# Patient Record
Sex: Female | Born: 1945
Health system: Southern US, Community
[De-identification: ages and names within clinical notes are randomized; demographics above are authoritative.]

## PROBLEM LIST (undated history)

## (undated) DIAGNOSIS — Z9289 Personal history of other medical treatment: Secondary | ICD-10-CM

## (undated) DIAGNOSIS — K219 Gastro-esophageal reflux disease without esophagitis: Secondary | ICD-10-CM

## (undated) DIAGNOSIS — R42 Dizziness and giddiness: Secondary | ICD-10-CM

## (undated) DIAGNOSIS — I471 Supraventricular tachycardia, unspecified: Secondary | ICD-10-CM

## (undated) DIAGNOSIS — N390 Urinary tract infection, site not specified: Secondary | ICD-10-CM

## (undated) DIAGNOSIS — E7800 Pure hypercholesterolemia, unspecified: Secondary | ICD-10-CM

## (undated) DIAGNOSIS — J189 Pneumonia, unspecified organism: Secondary | ICD-10-CM

## (undated) DIAGNOSIS — K5790 Diverticulosis of intestine, part unspecified, without perforation or abscess without bleeding: Secondary | ICD-10-CM

## (undated) DIAGNOSIS — G459 Transient cerebral ischemic attack, unspecified: Secondary | ICD-10-CM

## (undated) DIAGNOSIS — I10 Essential (primary) hypertension: Secondary | ICD-10-CM

## (undated) HISTORY — DX: Pneumonia, unspecified organism: J18.9

## (undated) HISTORY — PX: CARDIAC CATHETERIZATION: SHX172

## (undated) HISTORY — DX: Personal history of other medical treatment: Z92.89

## (undated) HISTORY — DX: Gastro-esophageal reflux disease without esophagitis: K21.9

## (undated) HISTORY — DX: Diverticulosis of intestine, part unspecified, without perforation or abscess without bleeding: K57.90

## (undated) HISTORY — DX: Supraventricular tachycardia: I47.1

## (undated) HISTORY — DX: Essential (primary) hypertension: I10

## (undated) HISTORY — DX: Supraventricular tachycardia, unspecified: I47.10

## (undated) HISTORY — DX: Transient cerebral ischemic attack, unspecified: G45.9

## (undated) HISTORY — DX: Hypercalcemia: E83.52

## (undated) HISTORY — PX: BREAST CYST ASPIRATION: SHX578

## (undated) HISTORY — DX: Urinary tract infection, site not specified: N39.0

---
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
## 1979-03-03 HISTORY — PX: CHOLECYSTECTOMY: SHX55

## 2008-03-02 DIAGNOSIS — G459 Transient cerebral ischemic attack, unspecified: Secondary | ICD-10-CM

## 2008-03-02 HISTORY — DX: Transient cerebral ischemic attack, unspecified: G45.9

## 2008-03-15 ENCOUNTER — Ambulatory Visit: Payer: Self-pay | Admitting: Family Medicine

## 2008-04-03 ENCOUNTER — Ambulatory Visit: Payer: Self-pay | Admitting: Family Medicine

## 2008-08-10 DIAGNOSIS — K219 Gastro-esophageal reflux disease without esophagitis: Secondary | ICD-10-CM | POA: Insufficient documentation

## 2008-08-10 DIAGNOSIS — K449 Diaphragmatic hernia without obstruction or gangrene: Secondary | ICD-10-CM

## 2008-08-10 DIAGNOSIS — I459 Conduction disorder, unspecified: Secondary | ICD-10-CM

## 2008-08-10 DIAGNOSIS — Z8673 Personal history of transient ischemic attack (TIA), and cerebral infarction without residual deficits: Secondary | ICD-10-CM

## 2008-08-10 DIAGNOSIS — I10 Essential (primary) hypertension: Secondary | ICD-10-CM

## 2009-05-20 DIAGNOSIS — E7800 Pure hypercholesterolemia, unspecified: Secondary | ICD-10-CM

## 2009-05-20 DIAGNOSIS — K7581 Nonalcoholic steatohepatitis (NASH): Secondary | ICD-10-CM

## 2009-07-03 ENCOUNTER — Ambulatory Visit: Payer: Self-pay | Admitting: Gastroenterology

## 2009-07-04 ENCOUNTER — Ambulatory Visit: Payer: Self-pay | Admitting: Gastroenterology

## 2010-05-15 ENCOUNTER — Ambulatory Visit: Payer: Self-pay | Admitting: Family Medicine

## 2010-08-27 ENCOUNTER — Ambulatory Visit: Payer: Self-pay | Admitting: Family Medicine

## 2011-01-02 DIAGNOSIS — Z780 Asymptomatic menopausal state: Secondary | ICD-10-CM | POA: Insufficient documentation

## 2011-04-08 ENCOUNTER — Ambulatory Visit: Payer: Self-pay | Admitting: Family Medicine

## 2011-05-24 ENCOUNTER — Ambulatory Visit: Payer: Self-pay | Admitting: Orthopedic Surgery

## 2011-05-26 ENCOUNTER — Encounter: Payer: Self-pay | Admitting: *Deleted

## 2011-05-26 ENCOUNTER — Ambulatory Visit (INDEPENDENT_AMBULATORY_CARE_PROVIDER_SITE_OTHER): Payer: Medicare Other | Admitting: Cardiovascular Disease

## 2011-05-26 VITALS — BP 135/85 | HR 73 | Ht 64.0 in | Wt 160.0 lb

## 2011-05-26 DIAGNOSIS — I471 Supraventricular tachycardia, unspecified: Secondary | ICD-10-CM

## 2011-05-26 DIAGNOSIS — I10 Essential (primary) hypertension: Secondary | ICD-10-CM

## 2011-05-26 DIAGNOSIS — E785 Hyperlipidemia, unspecified: Secondary | ICD-10-CM

## 2011-05-26 DIAGNOSIS — F4320 Adjustment disorder, unspecified: Secondary | ICD-10-CM | POA: Insufficient documentation

## 2011-05-26 DIAGNOSIS — I498 Other specified cardiac arrhythmias: Secondary | ICD-10-CM

## 2011-05-26 MED ORDER — METOPROLOL SUCCINATE ER 50 MG PO TB24: 50.0000 mg | ORAL_TABLET | Freq: Every day | ORAL | Status: DC

## 2011-05-26 NOTE — Progress Notes (Signed)
Patient ID: Jade Gardner, female    DOB: [birth year], 66 y.o.   MRN: [id number]  HPI Comments: [name] is a very pleasant 66 year old woman with a history of SVT, TIA in 1443 of uncertain etiology, hypertension, hyperlipidemia with total cholesterol in March of 2011 of 279, reaction to simvastatin, Nash syndrome cardiac catheterization in 1991 showing no significant coronary artery disease who presents to establish care.  She was previously seen in [facility]. She reports that she has had no significant runs of SVT if she takes her metoprolol succinate 50 mg daily. The longest run she's had is 5 minutes. Symptoms date back many years. She has had a difficult year as her husband passed away last year from a reaction to blood transfusion. She has had some problems adjusting, insomnia, increasing her weight. She has a supportive family and is trying to stay active.  For SVT, she has tried Cardizem in the past but had fatigue  Notes indicate carotid ultrasound in 2000 showing no significant stenosis  Echocardiogram May 2006 showing mild LVH, mild MR, moderate TR, biventricular systolic pressure 46 mmHg with mild pulmonary hypertension  Perfusion stress test January 2006 showing normal ejection fraction, poor exercise tolerance on Bruce protocol for 3 minutes 30 seconds, or Apical septal ischemia  Significant family history with brother with bypass surgery, father with bypass surgery at age 67, mother with stroke and heart attack in her 55s  EKG shows normal size with no significant ST or T wave changes   Outpatient Encounter Prescriptions as of 05/26/2011  Medication Sig Dispense Refill  . aspirin 81 MG tablet Take 81 mg by mouth daily.      . Calcium Carbonate-Vitamin D (CALTRATE 600+D PO) Take by mouth 2 (two) times daily.      . Cholecalciferol (D3-1000) 1000 UNITS capsule Take 1,000 Units by mouth daily.      . metoprolol succinate (TOPROL-XL) 50 MG 24 hr tablet Take 1  tablet (50 mg total) by mouth daily. Take with or immediately following a meal.  30 tablet  11  . omeprazole (PRILOSEC) 20 MG capsule Take 20 mg by mouth daily.      . vitamin B-12 (CYANOCOBALAMIN) 1000 MCG tablet Take 1,000 mcg by mouth daily.      . vitamin E 400 UNIT capsule Take 400 Units by mouth daily.        Review of Systems  Constitutional: Negative.   HENT: Negative.   Eyes: Negative.   Respiratory: Negative.   Cardiovascular: Negative.   Gastrointestinal: Negative.   Musculoskeletal: Negative.   Skin: Negative.   Neurological: Negative.   Hematological: Negative.   Psychiatric/Behavioral: Negative.   All other systems reviewed and are negative.    BP 135/85  Pulse 73  Ht 5' 4"  (1.626 m)  Wt 160 lb (72.576 kg)  BMI 27.46 kg/m2  Physical Exam  Nursing note and vitals reviewed. Constitutional: She is oriented to person, place, and time. She appears well-developed and well-nourished.  HENT:  Head: Normocephalic.  Nose: Nose normal.  Mouth/Throat: Oropharynx is clear and moist.  Eyes: Conjunctivae are normal. Pupils are equal, round, and reactive to light.  Neck: Normal range of motion. Neck supple. No JVD present.  Cardiovascular: Normal rate, regular rhythm, S1 normal, S2 normal, normal heart sounds and intact distal pulses.  Exam reveals no gallop and no friction rub.   No murmur heard. Pulmonary/Chest: Effort normal and breath sounds normal. No respiratory distress. She has no wheezes. She has  no rales. She exhibits no tenderness.  Abdominal: Soft. Bowel sounds are normal. She exhibits no distension. There is no tenderness.  Musculoskeletal: Normal range of motion. She exhibits no edema and no tenderness.  Lymphadenopathy:    She has no cervical adenopathy.  Neurological: She is alert and oriented to person, place, and time. Coordination normal.  Skin: Skin is warm and dry. No rash noted. No erythema.  Psychiatric: She has a normal mood and affect. Her behavior  is normal. Judgment and thought content normal.         Assessment and Plan

## 2011-05-26 NOTE — Assessment & Plan Note (Signed)
Cholesterol 280 in 2011. She has a very strong family history of coronary artery disease with brother having bypass surgery, father having bypass surgery, mother with stroke and heart attack. She reports she is scheduled to have routine blood work done in April. If this continues to show elevated total cholesterol, we have suggested she start a cholesterol medication. She had a previous reaction to simvastatin and we would have to use an alternate medication such as Lipitor or Crestor.

## 2011-05-26 NOTE — Assessment & Plan Note (Signed)
Rhythm appears to be well controlled on current dose of Toprol. No medication changes were made. We've asked her to contact our office if she has any worsening symptoms.

## 2011-05-26 NOTE — Assessment & Plan Note (Signed)
She continues to have insomnia and possible depression after the loss of her husband last year. Have asked her to talk with Dr. Tamala Julian about a sleeping pill. Medication such as Ambien or Restoril would likely be fine in the setting of previous  Arrhythmia.

## 2011-05-26 NOTE — Assessment & Plan Note (Signed)
Blood pressure is well controlled on today's visit. No changes made to the medications. I've encouraged her to increase her exercise for weight loss.

## 2011-05-26 NOTE — Patient Instructions (Addendum)
You are doing well. No medication changes were made.  Please call us if you have new issues that need to be addressed before your next appt.  Your physician wants you to follow-up in: 12 months.  You will receive a reminder letter in the mail two months in advance. If you don't receive a letter, please call our office to schedule the follow-up appointment. 

## 2011-09-02 ENCOUNTER — Ambulatory Visit: Payer: Self-pay | Admitting: Family Medicine

## 2011-11-04 ENCOUNTER — Ambulatory Visit: Payer: Self-pay | Admitting: Family Medicine

## 2012-01-17 ENCOUNTER — Ambulatory Visit: Payer: Self-pay | Admitting: Family Medicine

## 2012-01-17 LAB — CBC WITH DIFFERENTIAL/PLATELET
Basophil %: 0.8 %
Eosinophil #: 0.6 10*3/uL (ref 0.0–0.7)
HGB: 14.3 g/dL (ref 12.0–16.0)
Lymphocyte %: 23 %
MCH: 29.6 pg (ref 26.0–34.0)
MCHC: 33.2 g/dL (ref 32.0–36.0)
MCV: 89 fL (ref 80–100)
Monocyte #: 0.7 x10 3/mm (ref 0.2–0.9)
Monocyte %: 12.6 %
Neutrophil %: 52.5 %
Platelet: 179 10*3/uL (ref 150–440)
RBC: 4.85 10*6/uL (ref 3.80–5.20)
WBC: 5.3 10*3/uL (ref 3.6–11.0)

## 2012-01-19 ENCOUNTER — Ambulatory Visit: Payer: Self-pay | Admitting: Family Medicine

## 2012-05-27 ENCOUNTER — Encounter: Payer: Self-pay | Admitting: Cardiovascular Disease

## 2012-05-27 ENCOUNTER — Ambulatory Visit (INDEPENDENT_AMBULATORY_CARE_PROVIDER_SITE_OTHER): Payer: Medicare Other | Admitting: Cardiovascular Disease

## 2012-05-27 VITALS — BP 152/103 | HR 71 | Ht 63.0 in | Wt 162.8 lb

## 2012-05-27 DIAGNOSIS — I471 Supraventricular tachycardia: Secondary | ICD-10-CM

## 2012-05-27 DIAGNOSIS — F4320 Adjustment disorder, unspecified: Secondary | ICD-10-CM

## 2012-05-27 DIAGNOSIS — I10 Essential (primary) hypertension: Secondary | ICD-10-CM

## 2012-05-27 DIAGNOSIS — I498 Other specified cardiac arrhythmias: Secondary | ICD-10-CM

## 2012-05-27 DIAGNOSIS — E785 Hyperlipidemia, unspecified: Secondary | ICD-10-CM

## 2012-05-27 NOTE — Assessment & Plan Note (Signed)
Rare short episodes. She does not want any medication changes at this time. If symptoms get worse, antiarrhythmic could be added on top of her beta blocker

## 2012-05-27 NOTE — Progress Notes (Signed)
Patient ID: Jade Gardner, female    DOB: [birth year], 67 y.o.   MRN: [id number]  HPI Comments: Jade Gardner is a very pleasant 67 year old woman with a history of SVT, TIA in 4270 of uncertain etiology, hypertension, hyperlipidemia with total cholesterol in March of 2011 of 279, reaction to simvastatin, Jade Gardner,  cardiac catheterization in 1991 showing no significant coronary artery disease who presents for routine followup   previously seen in [facility]. no significant runs of SVT if she takes her metoprolol succinate 50 mg daily. The longest run she's had is several seconds at a time. She is relatively symptomatic when she has these episodes, one to 2 episodes per month. She does not want additional medications for these episodes.  Symptoms date back many years. She reports that she is walking 2 miles every morning, not recently secondary to recent family stressors. She reports blood pressure is elevated today because she did not sleep well and headache.  She has had a difficult year as her husband passed away last year from a reaction to blood transfusion. She has had some problems adjusting, insomnia, increasing her weight. She has a supportive family and is trying to stay active.  For SVT, she has tried Cardizem in the past but had fatigue  Notes indicate carotid ultrasound in 2000 showing no significant stenosis  Echocardiogram May 2006 showing mild LVH, mild MR, moderate TR, biventricular systolic pressure 46 mmHg with mild pulmonary hypertension  Perfusion stress test January 2006 showing normal ejection fraction, poor exercise tolerance on Bruce protocol for 3 minutes 30 seconds, or Apical septal ischemia  Significant family history with brother with bypass surgery, father with bypass surgery at age 27, mother with stroke and heart attack in her 68s  EKG shows normal sinus rhythm,  heart rate 71 beats per minute,  with no significant ST or T wave changes   Outpatient  Encounter Prescriptions as of 05/27/2012  Medication Sig Dispense Refill  . ALPRAZolam (XANAX) 0.5 MG tablet Take 0.5 mg by mouth at bedtime as needed for sleep.      Marland Kitchen aspirin 81 MG tablet Take 81 mg by mouth daily.      . benzonatate (TESSALON) 100 MG capsule Take 100 mg by mouth 3 (three) times daily as needed for cough.      . Calcium Carbonate-Vitamin D (CALTRATE 600+D PO) Take by mouth 2 (two) times daily.      . Cholecalciferol (VITAMIN D) 2000 UNITS CAPS Take 2,000 Units by mouth 2 (two) times daily.      . cyclobenzaprine (FLEXERIL) 5 MG tablet Take 2.5 mg by mouth daily as needed for muscle spasms.      . fluticasone (FLONASE) 50 MCG/ACT nasal spray Place 2 sprays into the nose daily.      Marland Kitchen HYDROcodone-homatropine (HYCODAN) 5-1.5 MG/5ML syrup Take by mouth every 6 (six) hours as needed for cough.      . loratadine (CLARITIN) 10 MG tablet Take 10 mg by mouth daily.      . metoprolol succinate (TOPROL-XL) 50 MG 24 hr tablet Take 1 tablet (50 mg total) by mouth daily. Take with or immediately following a meal.  30 tablet  11  . montelukast (SINGULAIR) 10 MG tablet Take 10 mg by mouth at bedtime.      Marland Kitchen omeprazole (PRILOSEC) 20 MG capsule Take 20 mg by mouth 2 (two) times daily.       . vitamin C (ASCORBIC ACID) 500 MG tablet Take 500 mg  by mouth daily.      . vitamin E 400 UNIT capsule Take 400 Units by mouth daily.      . [DISCONTINUED] Cholecalciferol (D3-1000) 1000 UNITS capsule Take 1,000 Units by mouth daily.      . [DISCONTINUED] vitamin B-12 (CYANOCOBALAMIN) 1000 MCG tablet Take 1,000 mcg by mouth daily.       No facility-administered encounter medications on file as of 05/27/2012.    Review of Systems  Constitutional: Negative.   Eyes: Negative.   Respiratory: Negative.   Cardiovascular: Negative.   Gastrointestinal: Negative.   Musculoskeletal: Negative.   Skin: Negative.   Neurological: Positive for headaches.  Psychiatric/Behavioral: Positive for sleep disturbance.   All other systems reviewed and are negative.    BP 152/103  Pulse 71  Ht 5' 3"  (1.6 m)  Wt 162 lb 12 oz (73.823 kg)  BMI 28.84 kg/m2  Physical Exam  Nursing note and vitals reviewed. Constitutional: She is oriented to person, place, and time. She appears well-developed and well-nourished.  HENT:  Head: Normocephalic.  Nose: Nose normal.  Mouth/Throat: Oropharynx is clear and moist.  Eyes: Conjunctivae are normal. Pupils are equal, round, and reactive to light.  Neck: Normal range of motion. Neck supple. No JVD present.  Cardiovascular: Normal rate, regular rhythm, S1 normal, S2 normal, normal heart sounds and intact distal pulses.  Exam reveals no gallop and no friction rub.   No murmur heard. Pulmonary/Chest: Effort normal and breath sounds normal. No respiratory distress. She has no wheezes. She has no rales. She exhibits no tenderness.  Abdominal: Soft. Bowel sounds are normal. She exhibits no distension. There is no tenderness.  Musculoskeletal: Normal range of motion. She exhibits no edema and no tenderness.  Lymphadenopathy:    She has no cervical adenopathy.  Neurological: She is alert and oriented to person, place, and time. Coordination normal.  Skin: Skin is warm and dry. No rash noted. No erythema.  Psychiatric: She has a normal mood and affect. Her behavior is normal. Judgment and thought content normal.    Assessment and Plan

## 2012-05-27 NOTE — Assessment & Plan Note (Signed)
She continues to have problems with her husband passing, symptoms of insomnia. Recent loss of a family member has triggered memories.

## 2012-05-27 NOTE — Assessment & Plan Note (Signed)
Blood pressure is very elevated today even on recheck. She attributes this to poor sleep and headache. We have asked her to closely monitor her blood pressure at home, increase her walking, watch her weight. We have suggested she right her blood pressures down and give these to her regular physician or our office in the next several weeks.

## 2012-05-27 NOTE — Assessment & Plan Note (Signed)
History of high cholesterol. Strong family history of heart disease we'll try to obtain her most recent lipid panel for our records. Ideally, LDL should be less than 100 given her mom had heart attack in her early 62s

## 2012-05-27 NOTE — Patient Instructions (Addendum)
You are doing well. No medication changes were made.  Consider Trying RED YEAST RICE for cholesterol 1 to 4 pills a  Add metamucil/fiber for cholesterol  Please monitor blood pressure at home Goal blood pressure numbers are <140 on the top, less than 90 on the bottom   Please call us if you have new issues that need to be addressed before your next appt.  Your physician wants you to follow-up in: 12 months.  You will receive a reminder letter in the mail two months in advance. If you don't receive a letter, please call our office to schedule the follow-up appointment.

## 2012-06-06 ENCOUNTER — Other Ambulatory Visit: Payer: Self-pay

## 2012-06-06 MED ORDER — METOPROLOL SUCCINATE ER 50 MG PO TB24: 50.0000 mg | ORAL_TABLET | Freq: Every day | ORAL | Status: DC

## 2012-06-06 NOTE — Telephone Encounter (Signed)
Refill sent for metoprolol succ

## 2012-11-13 ENCOUNTER — Ambulatory Visit: Payer: Self-pay

## 2012-12-16 ENCOUNTER — Ambulatory Visit: Payer: Self-pay | Admitting: Family Medicine

## 2013-03-24 ENCOUNTER — Ambulatory Visit: Payer: Self-pay | Admitting: Family Medicine

## 2013-03-24 ENCOUNTER — Emergency Department: Payer: Self-pay | Admitting: Emergency Medicine

## 2013-03-24 LAB — BASIC METABOLIC PANEL
Anion Gap: 5 — ABNORMAL LOW (ref 7–16)
BUN: 9 mg/dL (ref 7–18)
CALCIUM: 9.9 mg/dL (ref 8.5–10.1)
CO2: 25 mmol/L (ref 21–32)
Chloride: 107 mmol/L (ref 98–107)
Creatinine: 0.75 mg/dL (ref 0.60–1.30)
EGFR (Non-African Amer.): >60
Glucose: 88 mg/dL (ref 65–99)
Osmolality: 272 (ref 275–301)
Potassium: 4.2 mmol/L (ref 3.5–5.1)
SODIUM: 137 mmol/L (ref 136–145)

## 2013-03-24 LAB — CBC WITH DIFFERENTIAL/PLATELET
BASOS ABS: 0.1 10*3/uL (ref 0.0–0.1)
BASOS PCT: 0.9 %
EOS PCT: 5.9 %
Eosinophil #: 0.4 10*3/uL (ref 0.0–0.7)
HCT: 43.5 % (ref 35.0–47.0)
HGB: 14.1 g/dL (ref 12.0–16.0)
Lymphocyte #: 3 10*3/uL (ref 1.0–3.6)
Lymphocyte %: 41.3 %
MCH: 28.5 pg (ref 26.0–34.0)
MCHC: 32.3 g/dL (ref 32.0–36.0)
MCV: 88 fL (ref 80–100)
Monocyte #: 0.7 x10 3/mm (ref 0.2–0.9)
Monocyte %: 8.9 %
NEUTROS ABS: 3.2 10*3/uL (ref 1.4–6.5)
Neutrophil %: 43 %
Platelet: 224 10*3/uL (ref 150–440)
RBC: 4.94 10*6/uL (ref 3.80–5.20)
RDW: 13.1 % (ref 11.5–14.5)
WBC: 7.4 10*3/uL (ref 3.6–11.0)

## 2013-03-30 ENCOUNTER — Encounter: Payer: Self-pay | Admitting: Cardiovascular Disease

## 2013-03-30 ENCOUNTER — Ambulatory Visit (INDEPENDENT_AMBULATORY_CARE_PROVIDER_SITE_OTHER): Payer: Medicare Other | Admitting: Cardiovascular Disease

## 2013-03-30 VITALS — BP 130/90 | HR 66 | Ht 62.5 in | Wt 163.8 lb

## 2013-03-30 DIAGNOSIS — I471 Supraventricular tachycardia: Secondary | ICD-10-CM

## 2013-03-30 DIAGNOSIS — G459 Transient cerebral ischemic attack, unspecified: Secondary | ICD-10-CM

## 2013-03-30 DIAGNOSIS — R0602 Shortness of breath: Secondary | ICD-10-CM

## 2013-03-30 DIAGNOSIS — I498 Other specified cardiac arrhythmias: Secondary | ICD-10-CM

## 2013-03-30 DIAGNOSIS — E785 Hyperlipidemia, unspecified: Secondary | ICD-10-CM

## 2013-03-30 DIAGNOSIS — I10 Essential (primary) hypertension: Secondary | ICD-10-CM

## 2013-03-30 NOTE — Assessment & Plan Note (Signed)
No recent arrhythmia concerning for SVT

## 2013-03-30 NOTE — Patient Instructions (Addendum)
You are doing well. Please continue aspirin 81 mg with plavix one a day  We will order a holter monitor and carotid ultrasound, and echocardiogram with bubble study for TIA  Your physician has requested that you have an echocardiogram. Echocardiography is a painless test that uses sound waves to create images of your heart. It provides your doctor with information about the size and shape of your heart and how well your heart's chambers and valves are working. This procedure takes approximately one hour. There are no restrictions for this procedure.   Please call us if you have new issues that need to be addressed before your next appt.  Your physician wants you to follow-up in: 3 weeks

## 2013-03-30 NOTE — Progress Notes (Signed)
Patient ID: Jade Gardner, female    DOB: [birth year], 68 y.o.   MRN: [id number]  HPI Comments: Jade Gardner is a very pleasant 68 year old woman with a history of SVT, TIA in 0626 of uncertain etiology, hypertension, hyperlipidemia with total cholesterol in March of 2011 of 279, reaction to simvastatin, [name],  cardiac catheterization in 21-May-1989 showing no significant coronary artery disease who presents for routine followup  In followup today, she reports that she had a TIA on 03/24/2013. She had acute onset of slurred speech, headache, dizziness, blurred vision, numbness on Jade Gardner for head. She went to the emergency room and blood pressure was 200/100. She stayed most of the day and was discharged home. Instructed carotid ultrasound and other cardiac workup including echocardiogram, Holter. Aspirin was held in the hospital, started on Plavix since she has been home for blood pressure has been better controlled. She does have rare palpitations. She also has Bell's of shortness of breath of uncertain etiology.. Records from the hospital were reviewed CT scan of the head showed no stroke MRI of the head showed no acute stroke Lab work is essentially normal including basic metabolic panel and CBC  She had a difficult year 05-21-2012  as Jade Gardner husband passed away last year from a reaction to blood transfusion. She has had some problems adjusting, insomnia, increasing Jade Gardner weight. She has a supportive family and is trying to stay active.  For SVT, she has tried Cardizem in the past but had fatigue  Notes indicate carotid ultrasound in May 21, 1998 showing no significant stenosis  Echocardiogram May 2006 showing mild LVH, mild MR, moderate TR, biventricular systolic pressure 46 mmHg with mild pulmonary hypertension  Perfusion stress test January 2006 showing normal ejection fraction, poor exercise tolerance on Bruce protocol for 3 minutes 30 seconds, or Apical septal ischemia  Significant family history with brother with  bypass surgery, father with bypass surgery at age 31, mother with stroke and heart attack in Jade Gardner 10s  EKG shows normal sinus rhythm,  heart rate 66 beats per minute,  with no significant ST or T wave changes   Outpatient Encounter Prescriptions as of 03/30/2013  Medication Sig  . ALPRAZolam (XANAX) 0.5 MG tablet Take 0.5 mg by mouth at bedtime as needed for sleep.  Marland Kitchen aspirin 81 MG tablet Take 81 mg by mouth daily.  . benzonatate (TESSALON) 100 MG capsule Take 100 mg by mouth 3 (three) times daily as needed for cough.  . Calcium Carbonate-Vitamin D (CALTRATE 600+D PO) Take by mouth daily.   . Cholecalciferol (VITAMIN D) 2000 UNITS CAPS Take 2,000 Units by mouth daily.   . clopidogrel (PLAVIX) 75 MG tablet Take 75 mg by mouth daily with breakfast.  . cyclobenzaprine (FLEXERIL) 5 MG tablet Take 2.5 mg by mouth daily as needed for muscle spasms.  . diphenhydrAMINE (BENADRYL ALLERGY) 25 MG tablet Take 25 mg by mouth every 6 (six) hours as needed.  . fluticasone (FLONASE) 50 MCG/ACT nasal spray Place 2 sprays into the nose daily.  Marland Kitchen HYDROcodone-homatropine (HYCODAN) 5-1.5 MG/5ML syrup Take by mouth every 6 (six) hours as needed for cough.  Marland Kitchen ibuprofen (ADVIL,MOTRIN) 200 MG tablet Take 200 mg by mouth every 6 (six) hours as needed.  Marland Kitchen lisinopril (PRINIVIL,ZESTRIL) 10 MG tablet Take 10 mg by mouth daily.  Marland Kitchen loratadine (CLARITIN) 10 MG tablet Take 10 mg by mouth daily.  . metoprolol succinate (TOPROL-XL) 50 MG 24 hr tablet Take 1 tablet (50 mg total) by mouth daily. Take with or immediately  following a meal.  . montelukast (SINGULAIR) 10 MG tablet Take 10 mg by mouth at bedtime.  Marland Kitchen omeprazole (PRILOSEC) 20 MG capsule Take 20 mg by mouth 2 (two) times daily.   . promethazine (PHENERGAN) 25 MG tablet Take 25 mg by mouth every 6 (six) hours as needed for nausea or vomiting.  . Red Yeast Rice Extract (RED YEAST RICE PO) Take by mouth. Takes 1-2 tablets daily.  . vitamin C (ASCORBIC ACID) 500 MG tablet  Take 500 mg by mouth daily.  . vitamin E 1000 UNIT capsule Take 1,000 Units by mouth daily.  . [DISCONTINUED] vitamin E 400 UNIT capsule Take 400 Units by mouth daily.    Review of Systems  Constitutional: Negative.   Eyes: Negative.   Respiratory: Negative.   Cardiovascular: Negative.   Gastrointestinal: Negative.   Musculoskeletal: Negative.   Skin: Negative.   Neurological: Positive for headaches.       Slurring of Jade Gardner words, mental status changes, now resolved  Psychiatric/Behavioral: Positive for sleep disturbance.  All other systems reviewed and are negative.    BP 130/90  Pulse 66  Ht 5' 2.5" (1.588 m)  Wt 163 lb 12 oz (74.277 kg)  BMI 29.45 kg/m2  Physical Exam  Nursing note and vitals reviewed. Constitutional: She is oriented to person, place, and time. She appears well-developed and well-nourished.  HENT:  Head: Normocephalic.  Nose: Nose normal.  Mouth/Throat: Oropharynx is clear and moist.  Eyes: Conjunctivae are normal. Pupils are equal, round, and reactive to light.  Neck: Normal range of motion. Neck supple. No JVD present.  Cardiovascular: Normal rate, regular rhythm, S1 normal, S2 normal, normal heart sounds and intact distal pulses.  Exam reveals no gallop and no friction rub.   No murmur heard. Pulmonary/Chest: Effort normal and breath sounds normal. No respiratory distress. She has no wheezes. She has no rales. She exhibits no tenderness.  Abdominal: Soft. Bowel sounds are normal. She exhibits no distension. There is no tenderness.  Musculoskeletal: Normal range of motion. She exhibits no edema and no tenderness.  Lymphadenopathy:    She has no cervical adenopathy.  Neurological: She is alert and oriented to person, place, and time. Coordination normal.  Skin: Skin is warm and dry. No rash noted. No erythema.  Psychiatric: She has a normal mood and affect. Jade Gardner behavior is normal. Judgment and thought content normal.    Assessment and Plan

## 2013-03-30 NOTE — Assessment & Plan Note (Signed)
Recent TIA-type symptoms. No workup was done in the hospital other than CT of the head, MRI of the head. Prior TIA many years ago. We have suggested she stay on aspirin and Plavix. We'll order echocardiogram with bubble study to rule out PFO, other structural heart disease. Carotid ultrasound has been ordered. Holter monitor 48 hour has been ordered to rule out atrial fibrillation or other arrhythmia. We'll need to determine whether she needs a 30 day monitor in followup.

## 2013-03-30 NOTE — Assessment & Plan Note (Signed)
Blood pressure is well controlled on today's visit. No changes made to the medications. 

## 2013-03-30 NOTE — Assessment & Plan Note (Signed)
We'll try to obtain her most recent lipid panel. Currently not on a statin

## 2013-04-07 ENCOUNTER — Other Ambulatory Visit (INDEPENDENT_AMBULATORY_CARE_PROVIDER_SITE_OTHER): Payer: Medicare Other

## 2013-04-07 ENCOUNTER — Other Ambulatory Visit: Payer: Self-pay

## 2013-04-07 DIAGNOSIS — R0602 Shortness of breath: Secondary | ICD-10-CM

## 2013-04-07 DIAGNOSIS — I471 Supraventricular tachycardia: Secondary | ICD-10-CM

## 2013-04-07 DIAGNOSIS — G459 Transient cerebral ischemic attack, unspecified: Secondary | ICD-10-CM

## 2013-04-07 DIAGNOSIS — I369 Nonrheumatic tricuspid valve disorder, unspecified: Secondary | ICD-10-CM

## 2013-04-07 MED ORDER — CLOPIDOGREL BISULFATE 75 MG PO TABS: 75.0000 mg | ORAL_TABLET | Freq: Every day | ORAL | Status: DC

## 2013-04-11 ENCOUNTER — Encounter (INDEPENDENT_AMBULATORY_CARE_PROVIDER_SITE_OTHER): Payer: Medicare Other

## 2013-04-11 DIAGNOSIS — R42 Dizziness and giddiness: Secondary | ICD-10-CM

## 2013-04-11 DIAGNOSIS — I6529 Occlusion and stenosis of unspecified carotid artery: Secondary | ICD-10-CM

## 2013-04-11 DIAGNOSIS — G459 Transient cerebral ischemic attack, unspecified: Secondary | ICD-10-CM

## 2013-04-20 ENCOUNTER — Ambulatory Visit: Payer: Medicare Other | Admitting: Cardiovascular Disease

## 2013-04-24 ENCOUNTER — Telehealth: Payer: Self-pay

## 2013-04-24 NOTE — Telephone Encounter (Signed)
Reviewed results of event monitor w/ pt.   "NSR.  Rare short runs of atrial tachycardia & APCs".  Pt verbalizes understanding and will keep appt w/ Dr. Rockey Situ on Thursday.

## 2013-04-26 ENCOUNTER — Ambulatory Visit (INDEPENDENT_AMBULATORY_CARE_PROVIDER_SITE_OTHER): Payer: Medicare Other | Admitting: Cardiovascular Disease

## 2013-04-26 ENCOUNTER — Encounter (INDEPENDENT_AMBULATORY_CARE_PROVIDER_SITE_OTHER): Payer: Medicare Other

## 2013-04-26 ENCOUNTER — Other Ambulatory Visit: Payer: Self-pay

## 2013-04-26 ENCOUNTER — Encounter: Payer: Self-pay | Admitting: Cardiovascular Disease

## 2013-04-26 VITALS — BP 110/70 | HR 72 | Ht 62.5 in | Wt 162.5 lb

## 2013-04-26 DIAGNOSIS — I471 Supraventricular tachycardia, unspecified: Secondary | ICD-10-CM

## 2013-04-26 DIAGNOSIS — G459 Transient cerebral ischemic attack, unspecified: Secondary | ICD-10-CM

## 2013-04-26 DIAGNOSIS — I10 Essential (primary) hypertension: Secondary | ICD-10-CM

## 2013-04-26 DIAGNOSIS — E785 Hyperlipidemia, unspecified: Secondary | ICD-10-CM

## 2013-04-26 DIAGNOSIS — I498 Other specified cardiac arrhythmias: Secondary | ICD-10-CM

## 2013-04-26 DIAGNOSIS — R0602 Shortness of breath: Secondary | ICD-10-CM

## 2013-04-26 LAB — LIPID PANEL
CHOLESTEROL: 275 mg/dL — AB (ref 0–200)
HDL: 41 mg/dL (ref 35–70)
LDL Cholesterol: 189 mg/dL
TRIGLYCERIDES: 226 mg/dL — AB (ref 40–160)

## 2013-04-26 LAB — HEPATIC FUNCTION PANEL
ALT: 33 U/L (ref 7–35)
AST: 34 U/L (ref 13–35)

## 2013-04-26 LAB — BASIC METABOLIC PANEL
BUN: 18 mg/dL (ref 4–21)
Creatinine: 1 mg/dL (ref 0.5–1.1)
Glucose: 97 mg/dL
Potassium: 5.5 mmol/L — AB (ref 3.4–5.3)
Sodium: 142 mmol/L (ref 137–147)

## 2013-04-26 LAB — CBC AND DIFFERENTIAL
HEMATOCRIT: 44 % (ref 36–46)
HEMOGLOBIN: 14.4 g/dL (ref 12.0–16.0)
PLATELETS: 259 10*3/uL (ref 150–399)
WBC: 5.8 10*3/mL

## 2013-04-26 LAB — VITAMIN D 25 HYDROXY (VIT D DEFICIENCY, FRACTURES): VITAMIN D 1, 25 (OH) TOTAL: 28.9

## 2013-04-26 NOTE — Assessment & Plan Note (Signed)
She does report some dizziness. Blood pressure is low. We have recommended she hold the lisinopril for now. If blood pressure is high, she could try lisinopril 5 mg daily

## 2013-04-26 NOTE — Patient Instructions (Signed)
You are doing well. Please hold the lisinopril for 2 weeks If dizziness gets better, consider taking a 1/2 pill  If shortness of breath gets worse, call the office   Goal total cholesterol is <150 LDL <70  Please call us if you have new issues that need to be addressed before your next appt.  Your physician wants you to follow-up in: 6 months.  You will receive a reminder letter in the mail two months in advance. If you don't receive a letter, please call our office to schedule the follow-up appointment.

## 2013-04-26 NOTE — Progress Notes (Signed)
Patient ID: Jade Gardner, female    DOB: 07-06-1945, 68 y.o.   MRN: [id number]  HPI Comments: Jade Gardner is a very pleasant 68 year old woman with a history of SVT, TIA in 3536 of uncertain etiology, hypertension, hyperlipidemia with total cholesterol in March of 2011 of 279, reaction to simvastatin, [name],  cardiac catheterization in 05/11/89 showing no significant coronary artery disease who presents for routine followup   TIA on 03/24/2013. She had acute onset of slurred speech, headache, dizziness, blurred vision, numbness on her for head. She went to the emergency room and blood pressure was 200/100. She stayed most of the day and was discharged home. TIA happened while on low-dose aspirin  CT scan of the head showed no stroke MRI of the head showed no acute stroke Lab work is essentially normal including basic metabolic panel and CBC  In followup to our office she had an echocardiogram done that showed no ASD or PFO, otherwise normal study Carotid ultrasound showed bilateral 40-59% carotid arterial disease Holter monitor showed one short run of atrial tachycardia/SVT 7 beats, rare APCs   in followup she feels well. She has had symptoms of dizziness at times, also some shortness of breath with exertion Otherwise doing well   She had a difficult year 2012-05-11  as her husband passed away last year from a reaction to blood transfusion. She has had some problems adjusting, insomnia, increasing her weight. She has a supportive family and is trying to stay active.  For SVT, she has tried Cardizem in the past but had fatigue  Notes indicate carotid ultrasound in 05-11-1998 showing no significant stenosis  Echocardiogram May 2006 showing mild LVH, mild MR, moderate TR, biventricular systolic pressure 46 mmHg with mild pulmonary hypertension  Perfusion stress test January 2006 showing normal ejection fraction, poor exercise tolerance on Bruce protocol for 3 minutes 30 seconds, or Apical septal  ischemia  Significant family history with brother with bypass surgery, father with bypass surgery at age 28, mother with stroke and heart attack in her 14s  EKG shows normal sinus rhythm,  heart rate 72 beats per minute,  with no significant ST or T wave changes   Outpatient Encounter Prescriptions as of 05/11/2013  Medication Sig  . ALPRAZolam (XANAX) 0.5 MG tablet Take 0.5 mg by mouth at bedtime as needed for sleep.  Marland Kitchen aspirin 81 MG tablet Take 81 mg by mouth daily.  . benzonatate (TESSALON) 100 MG capsule Take 100 mg by mouth 3 (three) times daily as needed for cough.  . Calcium Carbonate-Vitamin D (CALTRATE 600+D PO) Take by mouth daily.   . Cholecalciferol (VITAMIN D) 2000 UNITS CAPS Take 2,000 Units by mouth daily.   . clopidogrel (PLAVIX) 75 MG tablet Take 1 tablet (75 mg total) by mouth daily with breakfast.  . cyclobenzaprine (FLEXERIL) 5 MG tablet Take 2.5 mg by mouth daily as needed for muscle spasms.  . diphenhydrAMINE (BENADRYL ALLERGY) 25 MG tablet Take 25 mg by mouth every 6 (six) hours as needed.  . fluticasone (FLONASE) 50 MCG/ACT nasal spray Place 2 sprays into the nose daily.  Marland Kitchen HYDROcodone-homatropine (HYCODAN) 5-1.5 MG/5ML syrup Take by mouth every 6 (six) hours as needed for cough.  Marland Kitchen ibuprofen (ADVIL,MOTRIN) 200 MG tablet Take 200 mg by mouth every 6 (six) hours as needed.  Marland Kitchen lisinopril (PRINIVIL,ZESTRIL) 10 MG tablet Take 10 mg by mouth daily.  Marland Kitchen loratadine (CLARITIN) 10 MG tablet Take 10 mg by mouth daily.  . metoprolol succinate (TOPROL-XL) 50 MG  24 hr tablet Take 1 tablet (50 mg total) by mouth daily. Take with or immediately following a meal.  . montelukast (SINGULAIR) 10 MG tablet Take 10 mg by mouth at bedtime.  Marland Kitchen omeprazole (PRILOSEC) 20 MG capsule Take 20 mg by mouth 2 (two) times daily.   . promethazine (PHENERGAN) 25 MG tablet Take 25 mg by mouth every 6 (six) hours as needed for nausea or vomiting.  . Red Yeast Rice Extract (RED YEAST RICE PO) Take by  mouth. Takes 1-2 tablets daily.  . vitamin C (ASCORBIC ACID) 500 MG tablet Take 500 mg by mouth daily.  . vitamin E 1000 UNIT capsule Take 1,000 Units by mouth daily.    Review of Systems  Constitutional: Negative.   HENT: Negative.   Eyes: Negative.   Respiratory: Negative.   Cardiovascular: Negative.   Gastrointestinal: Negative.   Endocrine: Negative.   Musculoskeletal: Negative.   Skin: Negative.   Allergic/Immunologic: Negative.   Neurological: Negative.   Hematological: Negative.   Psychiatric/Behavioral: Positive for sleep disturbance.  All other systems reviewed and are negative.   BP 110/70  Pulse 72  Ht 5' 2.5" (1.588 m)  Wt 162 lb 8 oz (73.71 kg)  BMI 29.23 kg/m2  Physical Exam  Nursing note and vitals reviewed. Constitutional: She is oriented to person, place, and time. She appears well-developed and well-nourished.  HENT:  Head: Normocephalic.  Nose: Nose normal.  Mouth/Throat: Oropharynx is clear and moist.  Eyes: Conjunctivae are normal. Pupils are equal, round, and reactive to light.  Neck: Normal range of motion. Neck supple. No JVD present.  Cardiovascular: Normal rate, regular rhythm, S1 normal, S2 normal, normal heart sounds and intact distal pulses.  Exam reveals no gallop and no friction rub.   No murmur heard. Pulmonary/Chest: Effort normal and breath sounds normal. No respiratory distress. She has no wheezes. She has no rales. She exhibits no tenderness.  Abdominal: Soft. Bowel sounds are normal. She exhibits no distension. There is no tenderness.  Musculoskeletal: Normal range of motion. She exhibits no edema and no tenderness.  Lymphadenopathy:    She has no cervical adenopathy.  Neurological: She is alert and oriented to person, place, and time. Coordination normal.  Skin: Skin is warm and dry. No rash noted. No erythema.  Psychiatric: She has a normal mood and affect. Her behavior is normal. Judgment and thought content normal.     Assessment and Plan

## 2013-04-26 NOTE — Assessment & Plan Note (Signed)
Rare short episode of 7 beats on her Holter monitor. No further medication changes made

## 2013-04-26 NOTE — Assessment & Plan Note (Signed)
I suspect she will need a statin. Goal total cholesterol less than 150, LDL less than 70 Previous problems on simvastatin She reports recent blood work. We'll try to obtain this for our records before starting any medication

## 2013-04-26 NOTE — Assessment & Plan Note (Signed)
Cause of her previous TIA possibly from underlying carotid arterial disease. Recommended she stay on low-dose aspirin and Plavix. Is very important to closely follow her cholesterol. Goal total cholesterol given her carotid disease now less than 150

## 2013-04-27 ENCOUNTER — Ambulatory Visit: Payer: Medicare Other | Admitting: Cardiovascular Disease

## 2013-05-29 ENCOUNTER — Ambulatory Visit: Payer: Medicare Other | Admitting: Cardiovascular Disease

## 2013-05-30 LAB — LIPID PANEL
ALT: 22
AST: 26 U/L
BUN: 12
CHOLESTEROL, TOTAL: 204
CK TOTAL: 76 U/L (ref ?–170.0)
CREATININE: 0.89
Glucose: 92
HDL: 45 mg/dL (ref 35–70)
LDL Cholesterol: 122 mg/dL
Potassium: 4.3 mmol/L
Sodium: 143
TRIGLYCERIDES: 184

## 2013-06-12 ENCOUNTER — Other Ambulatory Visit: Payer: Self-pay | Admitting: *Deleted

## 2013-06-12 MED ORDER — METOPROLOL SUCCINATE ER 50 MG PO TB24: 50.0000 mg | ORAL_TABLET | Freq: Every day | ORAL | Status: DC

## 2013-06-12 NOTE — Telephone Encounter (Signed)
Requested Prescriptions   Signed Prescriptions Disp Refills  . metoprolol succinate (TOPROL-XL) 50 MG 24 hr tablet 30 tablet 3    Sig: Take 1 tablet (50 mg total) by mouth daily. Take with or immediately following a meal.    Authorizing Provider: Minna Merritts    Ordering User: Britt Bottom

## 2013-06-28 LAB — LIPID PANEL
ALT: 23
AST: 25 U/L
Cholesterol, Total: 188
HDL: 45 mg/dL (ref 35–70)
LDL Cholesterol: 109 mg/dL
Total CK: 78 U/L (ref ?–170.0)
Triglycerides: 171

## 2013-09-15 ENCOUNTER — Ambulatory Visit (INDEPENDENT_AMBULATORY_CARE_PROVIDER_SITE_OTHER): Payer: Medicare Other | Admitting: Cardiovascular Disease

## 2013-09-15 ENCOUNTER — Encounter: Payer: Self-pay | Admitting: Cardiovascular Disease

## 2013-09-15 VITALS — BP 140/96 | HR 78 | Ht 63.0 in | Wt 160.8 lb

## 2013-09-15 DIAGNOSIS — R9431 Abnormal electrocardiogram [ECG] [EKG]: Secondary | ICD-10-CM | POA: Insufficient documentation

## 2013-09-15 DIAGNOSIS — I471 Supraventricular tachycardia: Secondary | ICD-10-CM

## 2013-09-15 DIAGNOSIS — E785 Hyperlipidemia, unspecified: Secondary | ICD-10-CM

## 2013-09-15 DIAGNOSIS — I6523 Occlusion and stenosis of bilateral carotid arteries: Secondary | ICD-10-CM

## 2013-09-15 DIAGNOSIS — G459 Transient cerebral ischemic attack, unspecified: Secondary | ICD-10-CM

## 2013-09-15 DIAGNOSIS — R5381 Other malaise: Secondary | ICD-10-CM | POA: Insufficient documentation

## 2013-09-15 DIAGNOSIS — R5383 Other fatigue: Secondary | ICD-10-CM

## 2013-09-15 DIAGNOSIS — I658 Occlusion and stenosis of other precerebral arteries: Secondary | ICD-10-CM

## 2013-09-15 DIAGNOSIS — I498 Other specified cardiac arrhythmias: Secondary | ICD-10-CM

## 2013-09-15 DIAGNOSIS — F4321 Adjustment disorder with depressed mood: Secondary | ICD-10-CM

## 2013-09-15 DIAGNOSIS — I158 Other secondary hypertension: Secondary | ICD-10-CM

## 2013-09-15 DIAGNOSIS — I6529 Occlusion and stenosis of unspecified carotid artery: Secondary | ICD-10-CM | POA: Insufficient documentation

## 2013-09-15 NOTE — Assessment & Plan Note (Signed)
Blood pressure on the high side, improved on recheck. Typically well controlled at home

## 2013-09-15 NOTE — Assessment & Plan Note (Signed)
Symptoms are very short lived lasting less than 10 seconds. No changes to her medications

## 2013-09-15 NOTE — Assessment & Plan Note (Signed)
Encouraged her to stay on her pravastatin. Goal LDL less than 70

## 2013-09-15 NOTE — Progress Notes (Signed)
Patient ID: Jade Gardner, female    DOB: [birth year], 68 y.o.   MRN: [id number]  HPI Comments: [name] is a very pleasant 68 year old woman with a history of SVT, TIA in 6301 of uncertain etiology with H/A, hypertension, hyperlipidemia with total cholesterol in March of 2011 of 279, reaction to simvastatin, Jade Gardner,  cardiac catheterization in Apr 29, 1989 showing no significant coronary artery disease who presents for routine followup  In followup today, she reports that she has not felt well this week. Earlier in the week she had lightheadedness/dizziness on Monday and Tuesday. Wednesday she felt better. Now feeling sleepy, no energy. Typically she walks 2 miles per day with no problems. She has not done at this week. She has not been sleeping well, taking Xanax at night for sleeping. In general she has poor sleep. She does continue to have very short episodes of tachycardia lasting 6-10 seconds at a time, worse when her sleep is poor  Recently seen by primary care yesterday with EKG showing nonspecific ST abnormality. EKG today showing no significant EKG changes. He seemed to have resolved   Previous TIA on 03/24/2013. She had acute onset of slurred speech, headache, dizziness, blurred vision, numbness on her for head. She went to the emergency room and blood pressure was 200/100. She stayed most of the day and was discharged home. TIA happened while on low-dose aspirin  CT scan of the head showed no stroke MRI of the head showed no acute stroke Lab work is essentially normal including basic metabolic panel and CBC  echocardiogram done that showed no ASD or PFO, otherwise normal study Carotid ultrasound showed bilateral 40-59% carotid arterial disease Holter monitor showed one short run of atrial tachycardia/SVT 7 beats, rare APCs  She had a difficult year 2012/04/29  as her husband passed away last year from a reaction to blood transfusion. She has had some problems adjusting, insomnia, increasing  her weight. She has a supportive family and is trying to stay active.  For SVT, she has tried Cardizem in the past but had fatigue  Notes indicate carotid ultrasound in 04-29-98 showing no significant stenosis  Echocardiogram May 2006 showing mild LVH, mild MR, moderate TR, biventricular systolic pressure 46 mmHg with mild pulmonary hypertension  Perfusion stress test January 2006 showing normal ejection fraction, poor exercise tolerance on Bruce protocol for 3 minutes 30 seconds, or Apical septal ischemia  Significant family history with brother with bypass surgery, father with bypass surgery at age 75, mother with stroke and heart attack in her 10s  EKG shows normal sinus rhythm,  heart rate 78 beats per minute,  with no significant ST or T wave changes   Outpatient Encounter Prescriptions as of 09/15/2013  Medication Sig  . ALPRAZolam (XANAX) 0.5 MG tablet Take 0.5 mg by mouth at bedtime as needed for sleep.  Marland Kitchen aspirin 81 MG tablet Take 81 mg by mouth daily.  . Calcium Carbonate-Vitamin D (CALTRATE 600+D PO) Take by mouth daily.   . clopidogrel (PLAVIX) 75 MG tablet Take 1 tablet (75 mg total) by mouth daily with breakfast.  . cyclobenzaprine (FLEXERIL) 5 MG tablet Take 2.5 mg by mouth daily as needed for muscle spasms.  . fluticasone (FLONASE) 50 MCG/ACT nasal spray Place 2 sprays into the nose daily.  Marland Kitchen ibuprofen (ADVIL,MOTRIN) 200 MG tablet Take 200 mg by mouth every 6 (six) hours as needed.  . loratadine (CLARITIN) 10 MG tablet Take 10 mg by mouth daily.  . metoprolol succinate (TOPROL-XL) 50  MG 24 hr tablet Take 1 tablet (50 mg total) by mouth daily. Take with or immediately following a meal.  . montelukast (SINGULAIR) 10 MG tablet Take 10 mg by mouth at bedtime.  Marland Kitchen omeprazole (PRILOSEC) 20 MG capsule Take 20 mg by mouth 2 (two) times daily.   . vitamin C (ASCORBIC ACID) 500 MG tablet Take 500 mg by mouth daily.  . vitamin E 1000 UNIT capsule Take 1,000 Units by mouth daily.  .  vitamin E 400 UNIT capsule Take 400 Units by mouth 2 (two) times daily.   Review of Systems  Constitutional: Positive for fatigue.  HENT: Negative.   Eyes: Negative.   Respiratory: Negative.   Cardiovascular: Negative.   Gastrointestinal: Negative.   Endocrine: Negative.   Musculoskeletal: Negative.   Skin: Negative.   Allergic/Immunologic: Negative.   Neurological: Positive for weakness.  Hematological: Negative.   Psychiatric/Behavioral: Positive for sleep disturbance and dysphoric mood.  All other systems reviewed and are negative.  BP 140/96  Pulse 78  Ht 5' 3"  (1.6 m)  Wt 160 lb 12 oz (72.916 kg)  BMI 28.48 kg/m2  Physical Exam  Nursing note and vitals reviewed. Constitutional: She is oriented to person, place, and time. She appears well-developed and well-nourished.  HENT:  Head: Normocephalic.  Nose: Nose normal.  Mouth/Throat: Oropharynx is clear and moist.  Eyes: Conjunctivae are normal. Pupils are equal, round, and reactive to light.  Neck: Normal range of motion. Neck supple. No JVD present.  Cardiovascular: Normal rate, regular rhythm, S1 normal, S2 normal, normal heart sounds and intact distal pulses.  Exam reveals no gallop and no friction rub.   No murmur heard. Pulmonary/Chest: Effort normal and breath sounds normal. No respiratory distress. She has no wheezes. She has no rales. She exhibits no tenderness.  Abdominal: Soft. Bowel sounds are normal. She exhibits no distension. There is no tenderness.  Musculoskeletal: Normal range of motion. She exhibits no edema and no tenderness.  Lymphadenopathy:    She has no cervical adenopathy.  Neurological: She is alert and oriented to person, place, and time. Coordination normal.  Skin: Skin is warm and dry. No rash noted. No erythema.  Psychiatric: She has a normal mood and affect. Her behavior is normal. Judgment and thought content normal.    Assessment and Plan

## 2013-09-15 NOTE — Patient Instructions (Signed)
You are doing well. No medication changes were made.  Please monitor your blood pressure at home  Ask Jade Gardner about trazadone for sleep  Please call us if you have new issues that need to be addressed before your next appt.  Your physician wants you to follow-up in: 6 months.  You will receive a reminder letter in the mail two months in advance. If you don't receive a letter, please call our office to schedule the follow-up appointment.

## 2013-09-15 NOTE — Assessment & Plan Note (Signed)
Lost her husband last year. Had been doing well

## 2013-09-15 NOTE — Assessment & Plan Note (Signed)
She has poor sleep, symptoms today are nonspecific. I'm concerned about depression. She's not sleeping well, takes occasional Xanax but not on a regular basis. Encouraged her to continue her regular exercise program.

## 2013-09-15 NOTE — Assessment & Plan Note (Signed)
Encouraged her to stay on her statin. Goal LDL less than 70. Repeat ultrasound next year

## 2013-09-15 NOTE — Assessment & Plan Note (Signed)
No significant EKG abnormality noted on today's visit. Outside EKG with nonspecific ST abnormality. Nonspecific symptoms today. We have suggested he watch her through the weekend, if symptoms persist into next week, stress testing could be ordered

## 2013-10-06 ENCOUNTER — Telehealth: Payer: Self-pay

## 2013-10-06 NOTE — Telephone Encounter (Signed)
Spoke w/ pt.  She reports that Dr. Benetta Spar is her PCP and she would like to know how long Dr. Rockey Situ plans to have pt on Plavix  & asa.  Also, CoQ10 was mentioned at last ov, but she would like to know what strength to get.  Please advise.  Thank you.

## 2013-10-06 NOTE — Telephone Encounter (Signed)
Pt has question regarding her Plavix. She also has a question regarding an over the counter med Dr. Rockey Situ told her to get.

## 2013-10-08 NOTE — Telephone Encounter (Signed)
Would stay on asa and plavix indefinately for stroke prevention Prior tia happened when on asa need something stronger  As for dose of Coenz P67, Uncertain. Would depend on the brand. unregulated

## 2013-10-09 NOTE — Telephone Encounter (Signed)
Spoke w/ pt.  Advised pt of Dr. Donivan Scull recommendations.  She verbalizes understanding and will call w/ further questions or concerns.

## 2013-10-16 ENCOUNTER — Other Ambulatory Visit: Payer: Self-pay | Admitting: *Deleted

## 2013-10-16 MED ORDER — METOPROLOL SUCCINATE ER 50 MG PO TB24: 50.0000 mg | ORAL_TABLET | Freq: Every day | ORAL | Status: DC

## 2013-10-16 NOTE — Telephone Encounter (Signed)
Requested Prescriptions   Signed Prescriptions Disp Refills  . metoprolol succinate (TOPROL-XL) 50 MG 24 hr tablet 30 tablet 3    Sig: Take 1 tablet (50 mg total) by mouth daily. Take with or immediately following a meal.    Authorizing Provider: Minna Merritts    Ordering User: Britt Bottom

## 2013-10-23 ENCOUNTER — Ambulatory Visit: Payer: Medicare Other | Admitting: Cardiovascular Disease

## 2014-01-18 LAB — VITAMIN D 25 HYDROXY (VIT D DEFICIENCY, FRACTURES): VITAMIN D 1, 25 (OH) TOTAL: 21.1

## 2014-02-19 ENCOUNTER — Other Ambulatory Visit: Payer: Self-pay | Admitting: *Deleted

## 2014-02-19 MED ORDER — METOPROLOL SUCCINATE ER 50 MG PO TB24: 50.0000 mg | ORAL_TABLET | Freq: Every day | ORAL | Status: DC

## 2014-04-26 ENCOUNTER — Encounter: Payer: Self-pay | Admitting: Cardiovascular Disease

## 2014-04-26 ENCOUNTER — Ambulatory Visit (INDEPENDENT_AMBULATORY_CARE_PROVIDER_SITE_OTHER): Payer: Medicare Other | Admitting: Cardiovascular Disease

## 2014-04-26 VITALS — BP 110/82 | HR 76 | Ht 62.5 in | Wt 158.5 lb

## 2014-04-26 DIAGNOSIS — E785 Hyperlipidemia, unspecified: Secondary | ICD-10-CM

## 2014-04-26 DIAGNOSIS — I471 Supraventricular tachycardia: Secondary | ICD-10-CM

## 2014-04-26 DIAGNOSIS — I158 Other secondary hypertension: Secondary | ICD-10-CM

## 2014-04-26 DIAGNOSIS — G459 Transient cerebral ischemic attack, unspecified: Secondary | ICD-10-CM

## 2014-04-26 DIAGNOSIS — I6523 Occlusion and stenosis of bilateral carotid arteries: Secondary | ICD-10-CM

## 2014-04-26 MED ORDER — ATORVASTATIN CALCIUM 20 MG PO TABS: 20.0000 mg | ORAL_TABLET | Freq: Every day | ORAL | Status: DC

## 2014-04-26 NOTE — Assessment & Plan Note (Signed)
40% bilateral carotid disease. We'll re-ultrasound in 2017 Stressed the importance we work on her cholesterol, goal LDL less than 70

## 2014-04-26 NOTE — Assessment & Plan Note (Signed)
No recent tachycardia or symptoms concerning for arrhythmia. No changes to her medications. Recommended she stay on her metoprolol

## 2014-04-26 NOTE — Assessment & Plan Note (Signed)
Blood pressure is well controlled on today's visit. No changes made to the medications. 

## 2014-04-26 NOTE — Assessment & Plan Note (Signed)
Currently on low-dose aspirin and Plavix. No recent TIA-type symptoms. No changes to her medications

## 2014-04-26 NOTE — Patient Instructions (Signed)
You are doing well. Please change to pravastatin to atorvastatin one a day   Goal total cholesterol is <150 Goal LDL <70  Please call us if you have new issues that need to be addressed before your next appt.  Your physician wants you to follow-up in: 6 months.  You will receive a reminder letter in the mail two months in advance. If you don't receive a letter, please call our office to schedule the follow-up appointment.

## 2014-04-26 NOTE — Progress Notes (Signed)
Patient ID: Jade Gardner, female    DOB: Mar 21, 1945, 69 y.o.   MRN: [id number]  HPI Comments: [name] is a very pleasant 69 year old woman with a history of SVT, TIA in 3614 of uncertain etiology with H/A, hypertension, hyperlipidemia with total cholesterol in March of 2011 of 279, reaction to simvastatin, [name],  cardiac catheterization in May 06, 1989 showing no significant coronary artery disease who presents for routine followup of her SVT and high cholesterol 40% bilateral carotid arterial disease Last cholesterol in 05/06/13 was 275  In follow-up today she reports that she is doing well. She denies any symptoms of tachycardia concerning for arrhythmia. She walks approximately 2 miles per day. She is concerned about cramping in her feet 2 days per week. She's had this for some time. Denies any chest pain or shortness of breath with exertion. Continues to have mild insomnia, takes Flexeril when necessary for symptoms Overall she reports that she is doing much better  EKG on today's visit shows normal sinus rhythm with rate 76 bpm, no significant ST or T-wave changes  Other past medical history  Previous TIA on 03/24/2013. She had acute onset of slurred speech, headache, dizziness, blurred vision, numbness on her for head. She went to the emergency room and blood pressure was 200/100. She stayed most of the day and was discharged home. TIA happened while on low-dose aspirin  CT scan of the head showed no stroke MRI of the head showed no acute stroke Lab work is essentially normal including basic metabolic panel and CBC  echocardiogram done that showed no ASD or PFO, otherwise normal study Carotid ultrasound showed bilateral 40-59% carotid arterial disease Holter monitor showed one short run of atrial tachycardia/SVT 7 beats, rare APCs  She had a difficult year 05/06/2012  as her husband passed away last year from a reaction to blood transfusion. She has had some problems adjusting, insomnia,  increasing her weight. She has a supportive family and is trying to stay active.  For SVT, she has tried Cardizem in the past but had fatigue  Echocardiogram May 2006 showing mild LVH, mild MR, moderate TR, biventricular systolic pressure 46 mmHg with mild pulmonary hypertension  Perfusion stress test January 2006 showing normal ejection fraction, poor exercise tolerance on Bruce protocol for 3 minutes 30 seconds, or Apical septal ischemia  Significant family history with brother with bypass surgery, father with bypass surgery at age 70, mother with stroke and heart attack in her 50s    Allergies  Allergen Reactions  . Fish Oil     Outpatient Encounter Prescriptions as of 05-06-2014  Medication Sig  . ALPRAZolam (XANAX) 0.5 MG tablet Take 0.5 mg by mouth at bedtime as needed for sleep.  Marland Kitchen aspirin 81 MG tablet Take 81 mg by mouth daily.  Marland Kitchen atorvastatin (LIPITOR) 20 MG tablet Take 1 tablet (20 mg total) by mouth daily.  . Calcium Carbonate-Vitamin D (CALTRATE 600+D PO) Take by mouth daily.   . cholecalciferol (VITAMIN D) 1000 UNITS tablet Take 1,000 Units by mouth daily.  . clopidogrel (PLAVIX) 75 MG tablet Take 1 tablet (75 mg total) by mouth daily with breakfast.  . Cyanocobalamin (VITAMIN B 12 PO) Take 1,000 mg by mouth daily.  . Cyanocobalamin 1000 MCG SUBL Place under the tongue every 30 (thirty) days.  . cyclobenzaprine (FLEXERIL) 5 MG tablet Take 2.5 mg by mouth daily as needed for muscle spasms.  . fluticasone (FLONASE) 50 MCG/ACT nasal spray Place 2 sprays into the nose daily.  Marland Kitchen  ibuprofen (ADVIL,MOTRIN) 200 MG tablet Take 200 mg by mouth every 6 (six) hours as needed.  . lansoprazole (PREVACID) 30 MG capsule Take 30 mg by mouth daily at 12 noon.   . loratadine (CLARITIN) 10 MG tablet Take 10 mg by mouth daily.  . metoprolol succinate (TOPROL-XL) 50 MG 24 hr tablet Take 1 tablet (50 mg total) by mouth daily. Take with or immediately following a meal.  . montelukast (SINGULAIR)  10 MG tablet Take 10 mg by mouth at bedtime.  . vitamin C (ASCORBIC ACID) 500 MG tablet Take 500 mg by mouth daily.  . vitamin E 1000 UNIT capsule Take 1,000 Units by mouth daily.  . [DISCONTINUED] omeprazole (PRILOSEC) 20 MG capsule Take 20 mg by mouth 2 (two) times daily.   . [DISCONTINUED] pravastatin (PRAVACHOL) 20 MG tablet Take 20 mg by mouth daily.  . [DISCONTINUED] vitamin E 400 UNIT capsule Take 400 Units by mouth daily.     Past Medical History  Diagnosis Date  . SVT (supraventricular tachycardia)     history of  . GERD (gastroesophageal reflux disease)   . Bronchitis   . Pneumonia   . Lumbar herniated disc   . TIA (transient ischemic attack)   . Hyperlipidemia   . Hypertension   . Vitamin D deficiency   . Vitamin B 12 deficiency     Past Surgical History  Procedure Laterality Date  . Appendectomy    . Cholecystectomy      Social History  reports that she has never smoked. She has never used smokeless tobacco. She reports that she does not drink alcohol or use illicit drugs.  Family History family history includes Heart disease in her father and mother; Stroke in an other family member.       Review of Systems  Constitutional: Negative.   Respiratory: Negative.   Cardiovascular: Negative.   Gastrointestinal: Negative.   Musculoskeletal: Negative.   Skin: Negative.   Neurological: Negative.   Hematological: Negative.   Psychiatric/Behavioral: Positive for sleep disturbance and dysphoric mood.  All other systems reviewed and are negative.  BP 110/82 mmHg  Pulse 76  Ht 5' 2.5" (1.588 m)  Wt 158 lb 8 oz (71.895 kg)  BMI 28.51 kg/m2  Physical Exam  Constitutional: She is oriented to person, place, and time. She appears well-developed and well-nourished.  HENT:  Head: Normocephalic.  Nose: Nose normal.  Mouth/Throat: Oropharynx is clear and moist.  Eyes: Conjunctivae are normal. Pupils are equal, round, and reactive to light.  Neck: Normal range of  motion. Neck supple. No JVD present.  Cardiovascular: Normal rate, regular rhythm, S1 normal, S2 normal, normal heart sounds and intact distal pulses.  Exam reveals no gallop and no friction rub.   No murmur heard. Pulmonary/Chest: Effort normal and breath sounds normal. No respiratory distress. She has no wheezes. She has no rales. She exhibits no tenderness.  Abdominal: Soft. Bowel sounds are normal. She exhibits no distension. There is no tenderness.  Musculoskeletal: Normal range of motion. She exhibits no edema or tenderness.  Lymphadenopathy:    She has no cervical adenopathy.  Neurological: She is alert and oriented to person, place, and time. Coordination normal.  Skin: Skin is warm and dry. No rash noted. No erythema.  Psychiatric: She has a normal mood and affect. Her behavior is normal. Judgment and thought content normal.    Assessment and Plan  Nursing note and vitals reviewed.

## 2014-04-26 NOTE — Assessment & Plan Note (Signed)
She reports having cramping in her feet twice per week. Unclear if this is from the pravastatin. Previously had muscle ache on simvastatin. Recommend we do a trial without the pravastatin, will start atorvastatin 20 mg daily

## 2014-07-21 ENCOUNTER — Other Ambulatory Visit: Payer: Self-pay | Admitting: Cardiovascular Disease

## 2014-08-13 ENCOUNTER — Other Ambulatory Visit: Payer: Self-pay | Admitting: Cardiovascular Disease

## 2014-08-13 DIAGNOSIS — I6523 Occlusion and stenosis of bilateral carotid arteries: Secondary | ICD-10-CM

## 2014-08-23 ENCOUNTER — Encounter: Payer: Self-pay | Admitting: Family Medicine

## 2014-08-23 ENCOUNTER — Encounter: Payer: Self-pay | Admitting: *Deleted

## 2014-08-23 ENCOUNTER — Ambulatory Visit (INDEPENDENT_AMBULATORY_CARE_PROVIDER_SITE_OTHER): Payer: Medicare Other | Admitting: Family Medicine

## 2014-08-23 ENCOUNTER — Ambulatory Visit (INDEPENDENT_AMBULATORY_CARE_PROVIDER_SITE_OTHER): Payer: Medicare Other

## 2014-08-23 VITALS — BP 120/88 | HR 60 | Temp 97.6°F | Resp 16 | Ht 61.5 in | Wt 161.0 lb

## 2014-08-23 DIAGNOSIS — M8580 Other specified disorders of bone density and structure, unspecified site: Secondary | ICD-10-CM | POA: Diagnosis not present

## 2014-08-23 DIAGNOSIS — R519 Headache, unspecified: Secondary | ICD-10-CM | POA: Insufficient documentation

## 2014-08-23 DIAGNOSIS — R739 Hyperglycemia, unspecified: Secondary | ICD-10-CM | POA: Insufficient documentation

## 2014-08-23 DIAGNOSIS — E7800 Pure hypercholesterolemia, unspecified: Secondary | ICD-10-CM

## 2014-08-23 DIAGNOSIS — I10 Essential (primary) hypertension: Secondary | ICD-10-CM

## 2014-08-23 DIAGNOSIS — R51 Headache: Secondary | ICD-10-CM

## 2014-08-23 DIAGNOSIS — E559 Vitamin D deficiency, unspecified: Secondary | ICD-10-CM | POA: Diagnosis not present

## 2014-08-23 DIAGNOSIS — R7309 Other abnormal glucose: Secondary | ICD-10-CM

## 2014-08-23 DIAGNOSIS — R9431 Abnormal electrocardiogram [ECG] [EKG]: Secondary | ICD-10-CM | POA: Insufficient documentation

## 2014-08-23 DIAGNOSIS — M5135 Other intervertebral disc degeneration, thoracolumbar region: Secondary | ICD-10-CM

## 2014-08-23 DIAGNOSIS — M79606 Pain in leg, unspecified: Secondary | ICD-10-CM | POA: Insufficient documentation

## 2014-08-23 DIAGNOSIS — E2839 Other primary ovarian failure: Secondary | ICD-10-CM

## 2014-08-23 DIAGNOSIS — R799 Abnormal finding of blood chemistry, unspecified: Secondary | ICD-10-CM | POA: Insufficient documentation

## 2014-08-23 DIAGNOSIS — M545 Low back pain, unspecified: Secondary | ICD-10-CM | POA: Insufficient documentation

## 2014-08-23 DIAGNOSIS — Z0000 Encounter for general adult medical examination without abnormal findings: Secondary | ICD-10-CM | POA: Diagnosis not present

## 2014-08-23 DIAGNOSIS — K5730 Diverticulosis of large intestine without perforation or abscess without bleeding: Secondary | ICD-10-CM

## 2014-08-23 DIAGNOSIS — M810 Age-related osteoporosis without current pathological fracture: Secondary | ICD-10-CM | POA: Insufficient documentation

## 2014-08-23 DIAGNOSIS — J309 Allergic rhinitis, unspecified: Secondary | ICD-10-CM

## 2014-08-23 DIAGNOSIS — E538 Deficiency of other specified B group vitamins: Secondary | ICD-10-CM

## 2014-08-23 DIAGNOSIS — N6019 Diffuse cystic mastopathy of unspecified breast: Secondary | ICD-10-CM

## 2014-08-23 DIAGNOSIS — R0689 Other abnormalities of breathing: Secondary | ICD-10-CM | POA: Insufficient documentation

## 2014-08-23 DIAGNOSIS — M940 Chondrocostal junction syndrome [Tietze]: Secondary | ICD-10-CM | POA: Insufficient documentation

## 2014-08-23 DIAGNOSIS — G4700 Insomnia, unspecified: Secondary | ICD-10-CM | POA: Insufficient documentation

## 2014-08-23 DIAGNOSIS — R5383 Other fatigue: Secondary | ICD-10-CM

## 2014-08-23 DIAGNOSIS — I6523 Occlusion and stenosis of bilateral carotid arteries: Secondary | ICD-10-CM

## 2014-08-23 DIAGNOSIS — H9190 Unspecified hearing loss, unspecified ear: Secondary | ICD-10-CM | POA: Insufficient documentation

## 2014-08-23 DIAGNOSIS — R5381 Other malaise: Secondary | ICD-10-CM | POA: Diagnosis not present

## 2014-08-23 DIAGNOSIS — G459 Transient cerebral ischemic attack, unspecified: Secondary | ICD-10-CM

## 2014-08-23 DIAGNOSIS — E663 Overweight: Secondary | ICD-10-CM | POA: Insufficient documentation

## 2014-08-23 DIAGNOSIS — R6889 Other general symptoms and signs: Secondary | ICD-10-CM | POA: Insufficient documentation

## 2014-08-23 DIAGNOSIS — R7303 Prediabetes: Secondary | ICD-10-CM

## 2014-08-23 DIAGNOSIS — I8393 Asymptomatic varicose veins of bilateral lower extremities: Secondary | ICD-10-CM | POA: Insufficient documentation

## 2014-08-23 DIAGNOSIS — R748 Abnormal levels of other serum enzymes: Secondary | ICD-10-CM | POA: Insufficient documentation

## 2014-08-23 DIAGNOSIS — D649 Anemia, unspecified: Secondary | ICD-10-CM | POA: Insufficient documentation

## 2014-08-23 DIAGNOSIS — G4733 Obstructive sleep apnea (adult) (pediatric): Secondary | ICD-10-CM | POA: Insufficient documentation

## 2014-08-23 LAB — LIPID PANEL
HCT: 42.2
HGB: 13.8 g/dL
Platelets: 252
TSH: 1.9
Vitamin D 1, 25 (OH)2 Total: 17.8
WBC: 7.7

## 2014-08-23 MED ORDER — CYANOCOBALAMIN 1000 MCG/ML IJ SOLN
1000.0000 ug | INTRAMUSCULAR | Status: DC
Start: 2014-08-23 — End: 2017-04-19
  Administered 2014-08-23: 1000 ug via INTRAMUSCULAR

## 2014-08-23 NOTE — Progress Notes (Signed)
Patient: Jade Gardner, Female    DOB: 1945-06-30, 69 y.o.   MRN: [id number] Visit Date: 08/23/2014  Today's Provider: Lelon Huh, MD   Chief Complaint  Patient presents with  . Medicare Wellness  . Hypertension  . Hyperlipidemia   Subjective:    Annual wellness visit Jade Gardner is a 69 y.o. female who presents today for her Subsequent Annual Wellness Visit. She feels well. She reports exercising yes. She reports she is sleeping fairly well.    Hypertension, follow-up:  BP Readings from Last 3 Encounters:  08/23/14 120/88  01/18/14 120/78  04/26/14 110/82    She was last seen for hypertension 10 months ago.  BP at that visit was 118/80. Management changes since that visit include none .She reports good compliance with treatment. She is not having side effects. none  She is exercising. She is not adherent to low salt diet.   Outside blood pressures are no. She is experiencing fatigue.  Patient denies palpitations.   Cardiovascular risk factors include none.  Use of agents associated with hypertension: none.   ------------------------------------------------------------------------    Lipid/Cholesterol, Follow-up:   Last seen for this 13  months ago.  Management changes since that visit include none.  Last Lipid Panel:    Component Value Date/Time   CHOL 188 06/28/2013   CHOL 275* 04/26/2013   TRIG 171 06/28/2013   TRIG 226* 04/26/2013   HDL 45 06/28/2013   LDLCALC 109 06/28/2013    She reports good compliance with treatment. She is not having side effects. none  Wt Readings from Last 3 Encounters:  08/23/14 161 lb (73.029 kg)  01/18/14 165 lb (74.844 kg)  04/26/14 158 lb 8 oz (71.895 kg)    ------------------------------------------------------------------------   Fatigue States that she stays fatigued all of the time. No dyspnea. Is a little a little sleepy during the day. Has history of OSA. Had been on B12 shots as below and felt a  little more energetic then. No chest pain, palpitations. Appetite and mood are fine.     B12 Deficiency  Patient was last seen for b-12 deficiency 01/18/2014. Patient advised to have b-12 injections once a month. Patient also taking 1000 mg vitamin b-12 tablets. Patient last seen for vitamin D deficiency 01/18/2014, no changes were made. Patient is taking OTC vitamin D3 1000 iu.  Review of Systems  Constitutional: Positive for fatigue. Negative for fever, chills, diaphoresis, activity change, appetite change and unexpected weight change.  HENT: Negative.   Eyes: Negative.   Respiratory: Positive for wheezing. Negative for apnea, cough, choking, chest tightness, shortness of breath and stridor.   Cardiovascular: Positive for palpitations. Negative for chest pain and leg swelling.  Gastrointestinal: Negative.   Endocrine: Negative.   Genitourinary: Negative.   Musculoskeletal: Positive for back pain and arthralgias. Negative for myalgias, joint swelling, gait problem, neck pain and neck stiffness.  Skin: Negative.   Allergic/Immunologic: Positive for environmental allergies. Negative for food allergies and immunocompromised state.  Neurological: Negative.   Hematological: Negative for adenopathy. Bruises/bleeds easily.  Psychiatric/Behavioral: Negative.     History   Social History  . Marital Status: Widowed    Spouse Name: N/A  . Number of Children: 2  . Years of Education: HS Grad   Occupational History  . Retired     [employer]   Social History Main Topics  . Smoking status: Never Smoker   . Smokeless tobacco: Never Used  . Alcohol Use: No  . Drug Use: No  .  Sexual Activity: Not on file   Other Topics Concern  . Not on file   Social History Narrative    Patient Active Problem List   Diagnosis Date Noted  . Diverticulosis 08/24/2014  . Fibrocystic breast disease 08/24/2014  . Acquired sideroblastic anemia 08/23/2014  . Allergic rhinitis 08/23/2014  . DDD  (degenerative disc disease), thoracolumbar 08/23/2014  . Abnormal liver enzymes 08/23/2014  . Hearing loss 08/23/2014  . Insomnia 08/23/2014  . Lumbago 08/23/2014  . Osteopenia 08/23/2014  . Overweight (BMI 25.0-29.9) 08/23/2014  . Pre-diabetes 08/23/2014  . Obstructive sleep apnea 08/23/2014  . Varicose vein 08/23/2014  . B12 deficiency 08/23/2014  . Vitamin D deficiency 08/23/2014  . Malaise and fatigue 09/15/2013  . Carotid stenosis 09/15/2013  . Hypertension, essential, benign 05/26/2011  . SVT (supraventricular tachycardia) 05/26/2011  . Menopause 01/02/2011  . Nonalcoholic steatohepatitis (NASH) 05/20/2009  . Hypercholesterolemia without hypertriglyceridemia 05/20/2009  . Diaphragmatic hernia 08/10/2008  . Cardiac conduction disorder 08/10/2008  . Benign essential HTN 08/10/2008  . GERD (gastroesophageal reflux disease) 08/10/2008    Past Surgical History  Procedure Laterality Date  . Appendectomy  1954  . Cholecystectomy  1981  . Cardiac catheterization  [id number]    Her family history includes Dementia in her father; Heart disease in her brother, father, and mother; Stroke in an other family member.    Previous Medications   ALPRAZOLAM (XANAX) 0.5 MG TABLET    Take 0.5 mg by mouth at bedtime as needed for sleep.   ASCORBIC ACID (VITAMIN C CR) 500 MG TBCR    Take by mouth.   ASPIRIN 81 MG TABLET    Take 81 mg by mouth daily.   ATORVASTATIN (LIPITOR) 20 MG TABLET    Take 1 tablet (20 mg total) by mouth daily.   CALCIUM CARBONATE-VITAMIN D (CALTRATE 600+D PO)    Take by mouth daily.    CHOLECALCIFEROL (VITAMIN D) 1000 UNITS TABLET    Take 1,000 Units by mouth daily.   CHOLECALCIFEROL (VITAMIN D3) 50000 UNITS CAPS    Take by mouth.   CLOPIDOGREL (PLAVIX) 75 MG TABLET    TAKE (1) TABLET BY MOUTH EVERY DAY WITH BREAKFAST   CYANOCOBALAMIN 1000 MCG SUBL    Place under the tongue every 30 (thirty) days.   FLUTICASONE (FLONASE) 50 MCG/ACT NASAL SPRAY    Place 2 sprays into  the nose daily.   FLUTICASONE FUROATE-VILANTEROL (BREO ELLIPTA) 100-25 MCG/INH AEPB    Inhale into the lungs.   IBUPROFEN (ADVIL,MOTRIN) 200 MG TABLET    Take 200 mg by mouth every 6 (six) hours as needed.   LANSOPRAZOLE (PREVACID) 30 MG CAPSULE    Take 30 mg by mouth daily at 12 noon.    LORATADINE (CLARITIN) 10 MG TABLET    Take 10 mg by mouth daily.   METOPROLOL SUCCINATE (TOPROL-XL) 50 MG 24 HR TABLET    TAKE (1) TABLET BY MOUTH EVERY DAY   MONTELUKAST (SINGULAIR) 10 MG TABLET    Take 10 mg by mouth at bedtime.   PRAVASTATIN (PRAVACHOL) 40 MG TABLET    Take by mouth.   VITAMIN C (ASCORBIC ACID) 500 MG TABLET    Take 500 mg by mouth daily.   VITAMIN E 1000 UNIT CAPSULE    Take 1,000 Units by mouth daily.    Patient Care Team: Birdie Sons, MD as PCP - General (Family Medicine)     Objective:   Vitals: BP 120/88 mmHg  Pulse 60  Temp(Src) 97.6 F (  36.4 C) (Oral)  Resp 16  Ht 5' 1.5" (1.562 m)  Wt 161 lb (73.029 kg)  BMI 29.93 kg/m2  SpO2 97%  Physical Exam   General Appearance:    Alert, cooperative, no distress, appears stated age  Head:    Normocephalic, without obvious abnormality, atraumatic  Eyes:    PERRL, conjunctiva/corneas clear, EOM's intact, fundi    benign, both eyes  Ears:    Normal TM's and external ear canals, both ears  Nose:   Nares normal, septum midline, mucosa normal, no drainage    or sinus tenderness  Throat:   Lips, mucosa, and tongue normal; teeth and gums normal  Neck:   Supple, symmetrical, trachea midline, no adenopathy;    thyroid:  no enlargement/tenderness/nodules; no carotid   bruit or JVD  Back:     Symmetric, no curvature, ROM normal, no CVA tenderness  Lungs:     Clear to auscultation bilaterally, respirations unlabored  Chest Wall:    No tenderness or deformity   Heart:    Regular rate and rhythm, S1 and S2 normal, no murmur, rub   or gallop  Breast Exam:    Deferred to Gyn  Abdomen:     Soft, non-tender, bowel sounds active all  four quadrants,    no masses, no organomegaly  Pevic:    deferred  Extremities:   Extremities normal, atraumatic, no cyanosis or edema  Pulses:   2+ and symmetric all extremities  Skin:   Skin color, texture, turgor normal, no rashes or lesions  Lymph nodes:   Cervical, supraclavicular, and axillary nodes normal  Neurologic:   CNII-XII intact, normal strength, sensation and reflexes    throughout    Activities of Daily Living In your present state of health, do you have any difficulty performing the following activities: 08/23/2014  Hearing? Y  Vision? N  Difficulty concentrating or making decisions? N  Walking or climbing stairs? N  Dressing or bathing? N  Doing errands, shopping? N    Fall Risk Assessment Fall Risk  08/23/2014  Falls in the past year? No     Depression Screen PHQ 2/9 Scores 08/23/2014  PHQ - 2 Score 0    Cognitive Testing - 6-CIT  Correct? Score   What year is it? yes 0 0 or 4  What month is it? yes 0 0 or 3  Memorize:    Pia Mau,  41,  [address]  [place],      What time is it? (within 1 hour) yes 0 0 or 3  Count backwards from 20 yes 0 0, 2, or 4  Name the months of the year yes 0 0, 2, or 4  Repeat name & address above yes 0 0, 2, 4, 6, 8, or 10       TOTAL SCORE  0/28   Interpretation:  Normal  Normal (0-7) Abnormal (8-28)    Audit-C Alcohol Use Screening  Question Answer Points  How often do you have alcoholic drink? never 0  How many drinks do you typically consume in a day? 0 0  How oftey will you drink 6 or more in a total? never 0  Total Score:  0   A score of 3 or more in women, and 4 or more in men indicates increased risk for alcohol abuse, EXCEPT if all of the points are from question 1.        Assessment & Plan:     Annual Wellness Visit  Reviewed patient's Family Medical History Reviewed and updated list of patient's medical providers Assessment of cognitive impairment was done Assessed patient's functional  ability Established a written schedule for health screening Walker Completed and Reviewed  Exercise Activities and Dietary recommendations Goals    None      Immunization History  Administered Date(s) Administered  . Hepatitis A 12/17/2008, 01/14/2009  . Hepatitis B 12/17/2008, 01/14/2009, 06/14/2009, 06/14/2009  . Influenza-Unspecified 12/31/2012, 11/08/2013  . Pneumococcal Conjugate-13 01/18/2014  . Pneumococcal Polysaccharide-23 08/12/2011    Health Maintenance  Topic Date Due  . TETANUS/TDAP  03/08/1963  . MAMMOGRAM  Gyn  . ZOSTAVAX  03/07/2004  . INFLUENZA VACCINE  10/01/2014  . COLONOSCOPY  07/04/2019  . DEXA SCAN  Completed  . PNA vac Low Risk Adult  Completed      Discussed health benefits of physical activity, and encouraged her to engage in regular exercise appropriate for her age and condition.    ------------------------------------------------------------------------------------------------------------  1. Pure hypercholesterolemia She is tolerating atorvastatin well with no adverse effects.   - Lipid panel  2. Hypertension, essential, benign well controlled Continue current medications.    3. Malaise and fatigue  - T4 AND TSH - CBC - Comprehensive metabolic panel  4. Allergic rhinitis, unspecified allergic rhinitis type Well controlled on Singulair year around.    5. Elevated blood sugar   7. Osteopenia BMD  8. Pre-diabetes  - Hemoglobin A1c  9. Vitamin D deficiency  - Vit D  25 hydroxy (rtn osteoporosis monitoring)  10. B12 deficiency  - Vitamin B12 - cyanocobalamin ((VITAMIN B-12)) injection 1,000 mcg; Inject 1 mL (1,000 mcg total) into the muscle every 30 (thirty) days.  11. Encounter for Medicare annual wellness exam   12. Estrogen deficiency  - DG Bone Density; Future

## 2014-08-24 ENCOUNTER — Telehealth: Payer: Self-pay

## 2014-08-24 ENCOUNTER — Encounter: Payer: Self-pay | Admitting: Family Medicine

## 2014-08-24 DIAGNOSIS — K5790 Diverticulosis of intestine, part unspecified, without perforation or abscess without bleeding: Secondary | ICD-10-CM | POA: Insufficient documentation

## 2014-08-24 DIAGNOSIS — N6019 Diffuse cystic mastopathy of unspecified breast: Secondary | ICD-10-CM | POA: Insufficient documentation

## 2014-08-24 DIAGNOSIS — E7800 Pure hypercholesterolemia, unspecified: Secondary | ICD-10-CM

## 2014-08-24 LAB — COMPREHENSIVE METABOLIC PANEL
ALK PHOS: 62 IU/L (ref 39–117)
ALT: 25 IU/L (ref 0–32)
AST: 30 IU/L (ref 0–40)
Albumin/Globulin Ratio: 1.6 (ref 1.1–2.5)
Albumin: 4.1 g/dL (ref 3.5–4.8)
BILIRUBIN TOTAL: 0.5 mg/dL (ref 0.0–1.2)
BUN/Creatinine Ratio: 16 (ref 11–26)
BUN: 14 mg/dL (ref 8–27)
CO2: 21 mmol/L (ref 18–29)
Calcium: 10.2 mg/dL (ref 8.7–10.3)
Chloride: 106 mmol/L (ref 97–108)
Creatinine, Ser: 0.87 mg/dL (ref 0.57–1.00)
GFR calc Af Amer: 78 mL/min/{1.73_m2} (ref >59)
GFR calc non Af Amer: 68 mL/min/{1.73_m2} (ref >59)
GLOBULIN, TOTAL: 2.5 g/dL (ref 1.5–4.5)
GLUCOSE: 94 mg/dL (ref 65–99)
Potassium: 4.4 mmol/L (ref 3.5–5.2)
Sodium: 142 mmol/L (ref 134–144)
Total Protein: 6.6 g/dL (ref 6.0–8.5)

## 2014-08-24 LAB — CBC
HEMOGLOBIN: 13.7 g/dL (ref 11.1–15.9)
Hematocrit: 41.5 % (ref 34.0–46.6)
MCH: 28.7 pg (ref 26.6–33.0)
MCHC: 33 g/dL (ref 31.5–35.7)
MCV: 87 fL (ref 79–97)
Platelets: 225 10*3/uL (ref 150–379)
RBC: 4.77 x10E6/uL (ref 3.77–5.28)
RDW: 14.1 % (ref 12.3–15.4)
WBC: 6 10*3/uL (ref 3.4–10.8)

## 2014-08-24 LAB — T4 AND TSH
T4 TOTAL: 8.4 ug/dL (ref 4.5–12.0)
TSH: 1.89 u[IU]/mL (ref 0.450–4.500)

## 2014-08-24 LAB — LIPID PANEL
CHOL/HDL RATIO: 5.8 ratio — AB (ref 0.0–4.4)
Cholesterol, Total: 244 mg/dL — ABNORMAL HIGH (ref 100–199)
HDL: 42 mg/dL (ref >39)
LDL CALC: 165 mg/dL — AB (ref 0–99)
Triglycerides: 183 mg/dL — ABNORMAL HIGH (ref 0–149)
VLDL Cholesterol Cal: 37 mg/dL (ref 5–40)

## 2014-08-24 LAB — HEMOGLOBIN A1C
Est. average glucose Bld gHb Est-mCnc: 123 mg/dL
Hgb A1c MFr Bld: 5.9 % — ABNORMAL HIGH (ref 4.8–5.6)

## 2014-08-24 LAB — VITAMIN D 25 HYDROXY (VIT D DEFICIENCY, FRACTURES): Vit D, 25-Hydroxy: 23.3 ng/mL — ABNORMAL LOW (ref 30.0–100.0)

## 2014-08-24 LAB — VITAMIN B12

## 2014-08-24 NOTE — Telephone Encounter (Signed)
Advised patient that she needed to increase atorvastatin to 40 mg daily. Patient admits that she has not been taking atorvastatin for the past month or so due to joint pain. Patient reports that she had similar symptoms while taking simvastatin and pravastatin in the past. Patient reports that her joint pain has resolved since stopping atorvastatin. She also states that she takes Vit D 800 units, and a calcium supplement of 660 mg once daily. Patient also wants to know does she still continue her Vit B12 injections? Please advise. Thanks!

## 2014-08-24 NOTE — Telephone Encounter (Signed)
Recommend she try samples Livalo 87m instead. Start with 1/2 tablet daily, #30 samples. If tolerating after 1 month then increase to 1 tablet daily.  Call back when samples run out, or if not tolerating  She should continue monthly B12 injections  She should increase vitamin D to 2000 units once a day. Make sure it is vitamin D3.

## 2014-08-24 NOTE — Telephone Encounter (Signed)
-----   Message from Birdie Sons, MD sent at 08/24/2014 11:32 AM EDT ----- Cholesterol is very high at 224. Need to increase atorvastatin to 60m daily, #30 rf x 3.  Vitamin D levels are low. Please check and see which dose of vitamin D she is taking. We have 1000units and 50000 units listed in chart.

## 2014-08-27 ENCOUNTER — Other Ambulatory Visit: Payer: Self-pay | Admitting: Family Medicine

## 2014-08-27 MED ORDER — LANSOPRAZOLE 30 MG PO CPDR: 30.0000 mg | DELAYED_RELEASE_CAPSULE | Freq: Every day | ORAL | Status: DC

## 2014-08-28 NOTE — Telephone Encounter (Signed)
Tried calling patient. No answer. Left message to call back.

## 2014-08-29 ENCOUNTER — Ambulatory Visit
Admission: RE | Admit: 2014-08-29 | Discharge: 2014-08-29 | Disposition: A | Discharge status: Home / Self Care | Payer: Medicare Other | Source: Ambulatory Visit | Attending: Family Medicine | Admitting: Family Medicine

## 2014-08-29 DIAGNOSIS — E2839 Other primary ovarian failure: Secondary | ICD-10-CM | POA: Diagnosis present

## 2014-08-29 DIAGNOSIS — Z13820 Encounter for screening for osteoporosis: Secondary | ICD-10-CM | POA: Diagnosis present

## 2014-08-29 DIAGNOSIS — M810 Age-related osteoporosis without current pathological fracture: Secondary | ICD-10-CM | POA: Diagnosis not present

## 2014-08-30 ENCOUNTER — Telehealth: Payer: Self-pay

## 2014-08-30 DIAGNOSIS — M810 Age-related osteoporosis without current pathological fracture: Secondary | ICD-10-CM

## 2014-08-30 MED ORDER — PITAVASTATIN CALCIUM 2 MG PO TABS: ORAL_TABLET | ORAL | Status: DC

## 2014-08-30 NOTE — Progress Notes (Signed)
Patient advised. Patient has questions. Separate telephone message sent to Dr. Caryn Section

## 2014-08-30 NOTE — Telephone Encounter (Signed)
OK to try Boniva. 113m one time a month. #1, rf x 12. Take it with water on an empty stomach. Do not drink, eat, or lie down for 1 hour after taking.

## 2014-08-30 NOTE — Telephone Encounter (Signed)
Patient advised and verbally voiced understanding. Samples left up front for pick up

## 2014-08-30 NOTE — Telephone Encounter (Signed)
Patient advised of the results. Patient states she has tried to take Alendronate several years ago and it caused her to have chest pain. Patient states she was seen by a Cardiologist for chest pain evaluation and they could not find a reason for having chest pain so it was attributed to taking the Alendronate. Patient wants to know if you recommend she try Boniva?

## 2014-08-30 NOTE — Telephone Encounter (Signed)
-----   Message from Birdie Sons, MD sent at 08/30/2014  8:02 AM EDT ----- Bone density shows osteroporosis in her hip. Borderline in her spine. Need to start alendronate 42m, one tablet each week, #4, rf x 12. Repeat bone density test in 2 years.

## 2014-08-31 MED ORDER — IBANDRONATE SODIUM 150 MG PO TABS: ORAL_TABLET | ORAL | Status: DC

## 2014-08-31 NOTE — Telephone Encounter (Signed)
Patient advised and agrees to try Boniva. Prescription sent into pharmacy.

## 2014-10-29 ENCOUNTER — Ambulatory Visit (INDEPENDENT_AMBULATORY_CARE_PROVIDER_SITE_OTHER): Payer: Medicare Other | Admitting: Cardiovascular Disease

## 2014-10-29 ENCOUNTER — Encounter: Payer: Self-pay | Admitting: Cardiovascular Disease

## 2014-10-29 VITALS — BP 124/82 | HR 64 | Ht 62.5 in | Wt 159.5 lb

## 2014-10-29 DIAGNOSIS — E7800 Pure hypercholesterolemia, unspecified: Secondary | ICD-10-CM

## 2014-10-29 DIAGNOSIS — I10 Essential (primary) hypertension: Secondary | ICD-10-CM

## 2014-10-29 DIAGNOSIS — I471 Supraventricular tachycardia: Secondary | ICD-10-CM | POA: Diagnosis not present

## 2014-10-29 DIAGNOSIS — I6523 Occlusion and stenosis of bilateral carotid arteries: Secondary | ICD-10-CM

## 2014-10-29 DIAGNOSIS — I459 Conduction disorder, unspecified: Secondary | ICD-10-CM | POA: Diagnosis not present

## 2014-10-29 NOTE — Patient Instructions (Signed)
You are doing well. No medication changes were made.  Please call us if you have new issues that need to be addressed before your next appt.  Your physician wants you to follow-up in: 12 months.  You will receive a reminder letter in the mail two months in advance. If you don't receive a letter, please call our office to schedule the follow-up appointment. 

## 2014-10-29 NOTE — Assessment & Plan Note (Signed)
We can wait several years before repeat carotid ultrasound. Recommended continued aggressive cholesterol management

## 2014-10-29 NOTE — Assessment & Plan Note (Signed)
Blood pressure is well controlled on today's visit. No changes made to the medications. 

## 2014-10-29 NOTE — Assessment & Plan Note (Signed)
Rare episodes of SVT. No further medication changes at this time. If symptoms get worse, recommended that she call our office

## 2014-10-29 NOTE — Assessment & Plan Note (Signed)
Currently tolerating livalo. If cholesterol not at goal, could start zetia next year when this goes generic Last resort would be praluent, though numbers may not be high enough to qualify

## 2014-10-29 NOTE — Progress Notes (Signed)
Patient ID: Jade Gardner, female    DOB: 04/08/1945, 69 y.o.   MRN: [id number]  HPI Comments: [name] is a very pleasant 69 year old woman with a history of SVT, TIA in 2836 of uncertain etiology with H/A, hypertension, hyperlipidemia with total cholesterol in March of 2011 of 279, reaction to simvastatin, [name],  cardiac catheterization in 05-16-1989 showing no significant coronary artery disease who presents for routine followup of her SVT and high cholesterol 40% bilateral carotid arterial disease Last cholesterol in 05/16/13 was 275   in follow-up today, she reports that she is unable to tolerate Lipitor She was started on livalo 2 mg daily. So far has been tolerating this Trying to exercise daily, active, works part time No significant SVT episodes, maybe 2 episodes lasting for less than 5 seconds She has had some back discomfort. Takes Flexeril, ibuprofen  EKG on today's visit shows normal sinus rhythm with rate 64 bpm, no significant ST or T-wave changes  Other past medical history  Previous TIA on 03/24/2013. She had acute onset of slurred speech, headache, dizziness, blurred vision, numbness on her for head. She went to the emergency room and blood pressure was 200/100. She stayed most of the day and was discharged home. TIA happened while on low-dose aspirin  CT scan of the head showed no stroke MRI of the head showed no acute stroke Lab work is essentially normal including basic metabolic panel and CBC  echocardiogram done that showed no ASD or PFO, otherwise normal study Carotid ultrasound showed bilateral 40-59% carotid arterial disease Holter monitor showed one short run of atrial tachycardia/SVT 7 beats, rare APCs  She had a difficult year 05-16-2012  as her husband passed away last year from a reaction to blood transfusion. She has had some problems adjusting, insomnia, increasing her weight. She has a supportive family and is trying to stay active.  For SVT, she has tried  Cardizem in the past but had fatigue  Echocardiogram May 2006 showing mild LVH, mild MR, moderate TR, biventricular systolic pressure 46 mmHg with mild pulmonary hypertension  Perfusion stress test January 2006 showing normal ejection fraction, poor exercise tolerance on Bruce protocol for 3 minutes 30 seconds, or Apical septal ischemia  Significant family history with brother with bypass surgery, father with bypass surgery at age 50, mother with stroke and heart attack in her 42s    Allergies  Allergen Reactions  . Fish Oil   . Simvastatin     Other reaction(s): Muscle Pain    Outpatient Encounter Prescriptions as of 10/29/2014  Medication Sig  . ALPRAZolam (XANAX) 0.5 MG tablet Take 0.5 mg by mouth at bedtime as needed for sleep.  Marland Kitchen aspirin 81 MG tablet Take 81 mg by mouth daily.  . Calcium Carbonate-Vitamin D (CALTRATE 600+D PO) Take by mouth daily.   . Cholecalciferol (VITAMIN D) 2000 UNITS tablet Take 2,000 Units by mouth daily.  . clopidogrel (PLAVIX) 75 MG tablet TAKE (1) TABLET BY MOUTH EVERY DAY WITH BREAKFAST  . Cyanocobalamin (B-12 IJ) Inject as directed every 30 (thirty) days.  . cyclobenzaprine (FLEXERIL) 10 MG tablet Take 10 mg by mouth as needed for muscle spasms.  . fluticasone (FLONASE) 50 MCG/ACT nasal spray Place 2 sprays into the nose as needed.   . Fluticasone Furoate-Vilanterol (BREO ELLIPTA) 100-25 MCG/INH AEPB Inhale into the lungs.  . ibandronate (BONIVA) 150 MG tablet 1 tablet once a month.  . ibuprofen (ADVIL,MOTRIN) 200 MG tablet Take 200 mg by mouth every 6 (  six) hours as needed.  . lansoprazole (PREVACID) 30 MG capsule Take 1 capsule (30 mg total) by mouth daily at 12 noon.  . loratadine (CLARITIN) 10 MG tablet Take 10 mg by mouth daily.  . metoprolol succinate (TOPROL-XL) 50 MG 24 hr tablet TAKE (1) TABLET BY MOUTH EVERY DAY  . montelukast (SINGULAIR) 10 MG tablet Take 10 mg by mouth at bedtime.  . Pitavastatin Calcium (LIVALO) 2 MG TABS 1/2 tablet  daily for 1 month, then increase to 1 full tablet daily (Patient taking differently: Take 2 mg by mouth daily. )  . vitamin B-12 (CYANOCOBALAMIN) 1000 MCG tablet Take 1,000 mcg by mouth daily.  . vitamin C (ASCORBIC ACID) 500 MG tablet Take 500 mg by mouth daily.  . vitamin E 1000 UNIT capsule Take 1,000 Units by mouth daily.  . [DISCONTINUED] Ascorbic Acid (VITAMIN C CR) 500 MG TBCR Take by mouth.  . [DISCONTINUED] atorvastatin (LIPITOR) 20 MG tablet Take 1 tablet (20 mg total) by mouth daily. (Patient not taking: Reported on 08/23/2014)  . [DISCONTINUED] cholecalciferol (VITAMIN D) 1000 UNITS tablet Take 1,000 Units by mouth daily.  . [DISCONTINUED] Cholecalciferol (VITAMIN D3) 50000 UNITS CAPS Take by mouth.  . [DISCONTINUED] Cyanocobalamin 1000 MCG SUBL Place under the tongue every 30 (thirty) days.  . [DISCONTINUED] pravastatin (PRAVACHOL) 40 MG tablet Take by mouth.   Facility-Administered Encounter Medications as of 10/29/2014  Medication  . cyanocobalamin ((VITAMIN B-12)) injection 1,000 mcg    Past Medical History  Diagnosis Date  . SVT (supraventricular tachycardia)     history of  . Pneumonia   . TIA (transient ischemic attack) 2010    Negative carotids.   . Diverticulosis   . Hypercalcemia     Secondar to calcium supplements. Normal PTH    Past Surgical History  Procedure Laterality Date  . Appendectomy  1954  . Cholecystectomy  1981  . Cardiac catheterization  [id number]    Social History  reports that she has never smoked. She has never used smokeless tobacco. She reports that she does not drink alcohol or use illicit drugs.  Family History family history includes Dementia in her father; Heart disease in her brother, father, and mother; Stroke in an other family member.       Review of Systems  Constitutional: Negative.   Respiratory: Negative.   Cardiovascular: Negative.   Gastrointestinal: Negative.   Musculoskeletal: Negative.   Skin: Negative.    Neurological: Negative.   Hematological: Negative.   Psychiatric/Behavioral: Negative.   All other systems reviewed and are negative.  BP 124/82 mmHg  Pulse 64  Ht 5' 2.5" (1.588 m)  Wt 159 lb 8 oz (72.349 kg)  BMI 28.69 kg/m2  Physical Exam  Constitutional: She is oriented to person, place, and time. She appears well-developed and well-nourished.  HENT:  Head: Normocephalic.  Nose: Nose normal.  Mouth/Throat: Oropharynx is clear and moist.  Eyes: Conjunctivae are normal. Pupils are equal, round, and reactive to light.  Neck: Normal range of motion. Neck supple. No JVD present.  Cardiovascular: Normal rate, regular rhythm, S1 normal, S2 normal, normal heart sounds and intact distal pulses.  Exam reveals no gallop and no friction rub.   No murmur heard. Pulmonary/Chest: Effort normal and breath sounds normal. No respiratory distress. She has no wheezes. She has no rales. She exhibits no tenderness.  Abdominal: Soft. Bowel sounds are normal. She exhibits no distension. There is no tenderness.  Musculoskeletal: Normal range of motion. She exhibits no edema or tenderness.  Lymphadenopathy:    She has no cervical adenopathy.  Neurological: She is alert and oriented to person, place, and time. Coordination normal.  Skin: Skin is warm and dry. No rash noted. No erythema.  Psychiatric: She has a normal mood and affect. Her behavior is normal. Judgment and thought content normal.    Assessment and Plan  Nursing note and vitals reviewed.

## 2014-11-26 ENCOUNTER — Other Ambulatory Visit: Payer: Self-pay | Admitting: Cardiovascular Disease

## 2014-12-28 ENCOUNTER — Other Ambulatory Visit: Payer: Self-pay | Admitting: Family Medicine

## 2014-12-28 MED ORDER — CYCLOBENZAPRINE HCL 10 MG PO TABS: 5.0000 mg | ORAL_TABLET | Freq: Every evening | ORAL | Status: DC | PRN

## 2015-02-27 ENCOUNTER — Other Ambulatory Visit: Payer: Self-pay | Admitting: Family Medicine

## 2015-02-27 ENCOUNTER — Telehealth: Payer: Self-pay | Admitting: Family Medicine

## 2015-02-27 NOTE — Telephone Encounter (Signed)
Patient notified. Patient scheduled appt for 03/06/2015 at 11:00 am.

## 2015-02-27 NOTE — Telephone Encounter (Signed)
lansoprazole (PREVACID) 30 MG capsule ibandronate (BONIVA) 150 MG tablet  Pt states she is changing insurance and she her insurance will not cover the above meds.  She would like to see if there is another medication she soul try that maybe her insurance would cover.

## 2015-02-27 NOTE — Telephone Encounter (Signed)
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

## 2015-02-27 NOTE — Telephone Encounter (Signed)
Needs office visit for follow up and to go over meds.

## 2015-03-06 ENCOUNTER — Ambulatory Visit (INDEPENDENT_AMBULATORY_CARE_PROVIDER_SITE_OTHER): Payer: Medicare Other | Admitting: Family Medicine

## 2015-03-06 VITALS — BP 146/78 | HR 68 | Temp 98.0°F | Resp 16 | Wt 160.0 lb

## 2015-03-06 DIAGNOSIS — K2100 Gastro-esophageal reflux disease with esophagitis, without bleeding: Secondary | ICD-10-CM

## 2015-03-06 DIAGNOSIS — R7303 Prediabetes: Secondary | ICD-10-CM

## 2015-03-06 DIAGNOSIS — E785 Hyperlipidemia, unspecified: Secondary | ICD-10-CM

## 2015-03-06 DIAGNOSIS — I10 Essential (primary) hypertension: Secondary | ICD-10-CM | POA: Diagnosis not present

## 2015-03-06 DIAGNOSIS — E559 Vitamin D deficiency, unspecified: Secondary | ICD-10-CM | POA: Diagnosis not present

## 2015-03-06 DIAGNOSIS — Z23 Encounter for immunization: Secondary | ICD-10-CM | POA: Diagnosis not present

## 2015-03-06 NOTE — Progress Notes (Signed)
Patient: Jade Gardner Female    DOB: Aug 21, 1945   70 y.o.   MRN: [id number] Visit Date: 03/06/2015  Today's Provider: Lelon Huh, MD   Chief Complaint  Patient presents with  . Gastroesophageal Reflux    follow up  . Hyperlipidemia    follow up  . Follow-up    Vitamin D deficency  . Follow-up    Osteopenia   Subjective:    HPI   GERD, Follow up:  The patient was last seen for GERD 6 months ago. Changes made since that visit include none.  She reports good compliance with treatment. Patient states her insurance no longer covers Prevacid. She is not having side effects.   She was previously on 10m omeprazole which she states worked better, but was changed to lansoprazole in 2015 due to potential interaction with Plavix which she is still taking.    ------------------------------------------------------------------------   Lipid/Cholesterol, Follow-up:   Last seen for this 6 months ago.  Management changes since that visit include changing Atorvastatin to Livalo. Due to adverse effects.  . Last Lipid Panel:    Component Value Date/Time   CHOL 244* 08/23/2014 1049   CHOL 188 06/28/2013   CHOL 275* 04/26/2013   TRIG 183* 08/23/2014 1049   TRIG 171 06/28/2013   HDL 42 08/23/2014 1049   HDL 45 06/28/2013   CHOLHDL 5.8* 08/23/2014 1049   LDLCALC 165* 08/23/2014 1049   LDLCALC 109 06/28/2013    Risk factors for vascular disease include hypercholesterolemia and hypertension  She reports poor compliance with treatment. Patient stopped taking Livalo after a few weeks due to it causing joint pain.  She is having side effects.  Current symptoms include none and have been stable. Weight trend: stable Prior visit with dietician: no Current diet: in general, a "healthy" diet   Current exercise: none  Wt Readings from Last 3 Encounters:  10/29/14 159 lb 8 oz (72.349 kg)  08/23/14 161 lb (73.029 kg)  01/18/14 165 lb (74.844 kg)     -------------------------------------------------------------------  Follow up Vitamin D Deficiency: Last office visit was 6 months ago. Changes made include increasing Vitamin D to 2,000units daily.  Patient reports good compliance with treatment.  Lab Results  Component Value Date   VD25OH 23.3* 08/23/2014      Hypertension, follow-up:  BP Readings from Last 3 Encounters:  03/06/15 146/78  10/29/14 124/82  08/23/14 120/88    She was last seen for hypertension 6 months ago.  BP at that visit was 120/88. Management since that visit includes no changes. She reports good compliance with treatment. She is not having side effects.  She is not exercising. She is adherent to low salt diet.   Outside blood pressures are not being checked. She is experiencing none.  Patient denies chest pain, chest pressure/discomfort, claudication, dyspnea, exertional chest pressure/discomfort, irregular heart beat, lower extremity edema, near-syncope, orthopnea, palpitations, paroxysmal nocturnal dyspnea, syncope and tachypnea.   Cardiovascular risk factors include advanced age (older than 516for men, 672for women), dyslipidemia and hypertension.  Use of agents associated with hypertension: NSAIDS.     Weight trend: stable Wt Readings from Last 3 Encounters:  03/06/15 160 lb (72.576 kg)  10/29/14 159 lb 8 oz (72.349 kg)  08/23/14 161 lb (73.029 kg)    Current diet: in general, a "healthy" diet    ------------------------------------------------------------------------      Allergies  Allergen Reactions  . Fish Oil   . Simvastatin  Other reaction(s): Muscle Pain   Previous Medications   ALPRAZOLAM (XANAX) 0.5 MG TABLET    Take 0.5 mg by mouth at bedtime as needed for sleep. Reported on 03/06/2015   ASPIRIN 81 MG TABLET    Take 81 mg by mouth daily.   CALCIUM CARBONATE-VITAMIN D (CALTRATE 600+D PO)    Take by mouth daily.    CHOLECALCIFEROL (VITAMIN D) 2000 UNITS TABLET    Take  2,000 Units by mouth daily.   CLOPIDOGREL (PLAVIX) 75 MG TABLET    TAKE (1) TABLET BY MOUTH EVERY DAY WITH BREAKFAST   CYANOCOBALAMIN (,VITAMIN B-12,) 1000 MCG/ML INJECTION    INJECT 1 ML INTRAMUSCULARLY MONTHLY.   CYCLOBENZAPRINE (FLEXERIL) 10 MG TABLET    Take 0.5-1 tablets (5-10 mg total) by mouth at bedtime as needed for muscle spasms.   FLUTICASONE (FLONASE) 50 MCG/ACT NASAL SPRAY    Place 2 sprays into the nose as needed.    FLUTICASONE FUROATE-VILANTEROL (BREO ELLIPTA) 100-25 MCG/INH AEPB    Inhale into the lungs.   IBANDRONATE (BONIVA) 150 MG TABLET    1 tablet once a month.   IBUPROFEN (ADVIL,MOTRIN) 200 MG TABLET    Take 200 mg by mouth every 6 (six) hours as needed.   LANSOPRAZOLE (PREVACID) 30 MG CAPSULE    Take 1 capsule (30 mg total) by mouth daily at 12 noon.   LORATADINE (CLARITIN) 10 MG TABLET    Take 10 mg by mouth daily.   METOPROLOL SUCCINATE (TOPROL-XL) 50 MG 24 HR TABLET    TAKE ONE (1) TABLET BY MOUTH ONCE DAILY   MONTELUKAST (SINGULAIR) 10 MG TABLET    Take 10 mg by mouth at bedtime.   PITAVASTATIN CALCIUM (LIVALO) 2 MG TABS    1/2 tablet daily for 1 month, then increase to 1 full tablet daily   VITAMIN B-12 (CYANOCOBALAMIN) 1000 MCG TABLET    Take 1,000 mcg by mouth daily.   VITAMIN C (ASCORBIC ACID) 500 MG TABLET    Take 500 mg by mouth daily.   VITAMIN E 1000 UNIT CAPSULE    Take 1,000 Units by mouth daily.    Review of Systems  Constitutional: Negative for fever, chills, appetite change and fatigue.  Respiratory: Negative for chest tightness and shortness of breath.   Cardiovascular: Negative for chest pain and palpitations.  Gastrointestinal: Negative for nausea, vomiting and abdominal pain.  Neurological: Negative for dizziness and weakness.    Social History  Substance Use Topics  . Smoking status: Never Smoker   . Smokeless tobacco: Never Used  . Alcohol Use: No   Objective:   BP 146/78 mmHg  Pulse 68  Temp(Src) 98 F (36.7 C) (Oral)  Resp 16  Wt  160 lb (72.576 kg)  SpO2 95%  Physical Exam   General Appearance:    Alert, cooperative, no distress  Eyes:    PERRL, conjunctiva/corneas clear, EOM's intact       Lungs:     Clear to auscultation bilaterally, respirations unlabored  Heart:    Regular rate and rhythm  Neurologic:   Awake, alert, oriented x 3. No apparent focal neurological           defect.          Assessment & Plan:     1. Vitamin D deficiency Tolerating 2000 units d3 daily - VITAMIN D 25 Hydroxy (Vit-D Deficiency, Fractures)  2. Benign essential HTN Well controlled.  Continue current medications.    3. Pre-diabetes  - Hemoglobin A1c  4. Hypertension, essential, benign Well controlled.  Continue current medications.    5. Gastroesophageal reflux disease with esophagitis Insurance no longer covers lansoprazole. She is going to bring in copy of her medication formulary and will keep in mind she is on Plavix chronically.   6. Need for influenza vaccination  - Flu Vaccine QUAD 36+ mos IM  7. Hyperlipidemia Intolerant to multiple statins. If cholesterol is still very high will consider Zetia or cholestyramine.  - Lipid panel       Lelon Huh, MD  [place]

## 2015-03-07 ENCOUNTER — Telehealth: Payer: Self-pay | Admitting: Family Medicine

## 2015-03-07 DIAGNOSIS — E559 Vitamin D deficiency, unspecified: Secondary | ICD-10-CM | POA: Diagnosis not present

## 2015-03-07 DIAGNOSIS — R7303 Prediabetes: Secondary | ICD-10-CM | POA: Diagnosis not present

## 2015-03-07 DIAGNOSIS — E785 Hyperlipidemia, unspecified: Secondary | ICD-10-CM | POA: Diagnosis not present

## 2015-03-07 NOTE — Telephone Encounter (Signed)
Please advise patient that her insurance does cover pantoprazole which is safe to take with Plavix. Please advise and send in rx for pantoprazole 42m per day with a year's refill and have her stop taking Prevacid. Thanks.

## 2015-03-08 LAB — VITAMIN D 25 HYDROXY (VIT D DEFICIENCY, FRACTURES): Vit D, 25-Hydroxy: 24.9 ng/mL — ABNORMAL LOW (ref 30.0–100.0)

## 2015-03-08 LAB — LIPID PANEL
CHOL/HDL RATIO: 6.4 ratio — AB (ref 0.0–4.4)
CHOLESTEROL TOTAL: 269 mg/dL — AB (ref 100–199)
HDL: 42 mg/dL (ref >39)
LDL Calculated: 187 mg/dL — ABNORMAL HIGH (ref 0–99)
TRIGLYCERIDES: 200 mg/dL — AB (ref 0–149)
VLDL CHOLESTEROL CAL: 40 mg/dL (ref 5–40)

## 2015-03-08 LAB — HEMOGLOBIN A1C
ESTIMATED AVERAGE GLUCOSE: 114 mg/dL
HEMOGLOBIN A1C: 5.6 % (ref 4.8–5.6)

## 2015-03-08 NOTE — Telephone Encounter (Signed)
Tried calling patient. Left message to call back.

## 2015-03-12 ENCOUNTER — Telehealth: Payer: Self-pay | Admitting: *Deleted

## 2015-03-12 MED ORDER — PANTOPRAZOLE SODIUM 40 MG PO TBEC: 40.0000 mg | DELAYED_RELEASE_TABLET | Freq: Every day | ORAL | Status: DC

## 2015-03-12 MED ORDER — EZETIMIBE 10 MG PO TABS: 10.0000 mg | ORAL_TABLET | Freq: Every day | ORAL | Status: DC

## 2015-03-12 NOTE — Telephone Encounter (Signed)
-----   Message from Birdie Sons, MD sent at 03/12/2015 10:48 AM EST ----- Average blood sugar is normal at 114. Cholesterol is very high at 169. Need to try Zetia 27m daily, #30, rf x 3, which lowers cholesterol, but is not a statin and not have side effects that statins do. Call if any problems with medication. Otherwise follow up for cholesterol in 3 months.

## 2015-03-12 NOTE — Telephone Encounter (Signed)
Patient also wanted to let us know that she has swelling in her right foot. Patient was adamant about not coming in for ov. She just wanted it documented that she has swelling in her foot. Patient stated that she did not injure her foot, it just swelled up two days ago. No bruising or discoloration. Some discomfort in foot. Patient stated that her foot is not improved in the next few days, she will call back.

## 2015-03-12 NOTE — Telephone Encounter (Signed)
Patient was notified of results. Patient expressed understanding. Rx sent to pharmacy.

## 2015-03-12 NOTE — Telephone Encounter (Signed)
Patient was notified. Rx was sent to pharmacy.

## 2015-03-25 ENCOUNTER — Telehealth: Payer: Self-pay | Admitting: Family Medicine

## 2015-03-25 NOTE — Telephone Encounter (Signed)
Please review. Thanks!  

## 2015-03-25 NOTE — Telephone Encounter (Signed)
Pt is having a time with her insurance not paying for her medication.  She said they were charging her 216.00 for the Eztimide 72m.   Nor paying for her B12 injections.  She has [employer] .  She said they will pay for the name brand but not the generic.  Her call back is [phone]  Thanks [facility] [employer]

## 2015-03-26 NOTE — Telephone Encounter (Signed)
I'm not sure what she is asking for. Does she want [place] to send a prescription for name brand Zetia in place of the ezetimibe?   Can change to oral vitamin B12 1,000 mg once a day if insurance is no longer covering shots.

## 2015-03-27 NOTE — Telephone Encounter (Signed)
Pt advised as below. Pt will ask at her pharmacy about Zetia and reports that she is already taking OTC Vitamin B12 2000 iu daily. Patient will call back with for any new changes. sd

## 2015-04-21 ENCOUNTER — Ambulatory Visit
Admission: EM | Admit: 2015-04-21 | Discharge: 2015-04-21 | Disposition: A | Discharge status: Home / Self Care | Payer: Medicare Other | Attending: Family Medicine | Admitting: Family Medicine

## 2015-04-21 ENCOUNTER — Ambulatory Visit: Payer: Medicare Other

## 2015-04-21 DIAGNOSIS — K5790 Diverticulosis of intestine, part unspecified, without perforation or abscess without bleeding: Secondary | ICD-10-CM | POA: Insufficient documentation

## 2015-04-21 DIAGNOSIS — Z7982 Long term (current) use of aspirin: Secondary | ICD-10-CM | POA: Insufficient documentation

## 2015-04-21 DIAGNOSIS — Z79899 Other long term (current) drug therapy: Secondary | ICD-10-CM | POA: Insufficient documentation

## 2015-04-21 DIAGNOSIS — Z8673 Personal history of transient ischemic attack (TIA), and cerebral infarction without residual deficits: Secondary | ICD-10-CM | POA: Diagnosis not present

## 2015-04-21 DIAGNOSIS — R05 Cough: Secondary | ICD-10-CM | POA: Diagnosis not present

## 2015-04-21 DIAGNOSIS — J111 Influenza due to unidentified influenza virus with other respiratory manifestations: Secondary | ICD-10-CM | POA: Diagnosis not present

## 2015-04-21 DIAGNOSIS — R059 Cough, unspecified: Secondary | ICD-10-CM

## 2015-04-21 LAB — RAPID INFLUENZA A&B ANTIGENS (ARMC ONLY)
INFLUENZA A (ARMC): NOT DETECTED
INFLUENZA B (ARMC): NOT DETECTED

## 2015-04-21 MED ORDER — FLUTICASONE PROPIONATE 50 MCG/ACT NA SUSP: 2.0000 | Freq: Every day | NASAL | Status: DC

## 2015-04-21 MED ORDER — ALBUTEROL SULFATE HFA 108 (90 BASE) MCG/ACT IN AERS: 1.0000 | INHALATION_SPRAY | Freq: Four times a day (QID) | RESPIRATORY_TRACT | Status: DC | PRN

## 2015-04-21 MED ORDER — OSELTAMIVIR PHOSPHATE 75 MG PO CAPS: 75.0000 mg | ORAL_CAPSULE | Freq: Two times a day (BID) | ORAL | Status: DC

## 2015-04-21 MED ORDER — IPRATROPIUM-ALBUTEROL 0.5-2.5 (3) MG/3ML IN SOLN
3.0000 mL | Freq: Once | RESPIRATORY_TRACT | Status: AC
  Administered 2015-04-21: 3 mL via RESPIRATORY_TRACT

## 2015-04-21 MED ORDER — ONDANSETRON 8 MG PO TBDP
8.0000 mg | ORAL_TABLET | Freq: Once | ORAL | Status: AC
  Administered 2015-04-21: 8 mg via ORAL

## 2015-04-21 MED ORDER — PREDNISONE 10 MG PO TABS: ORAL_TABLET | ORAL | Status: DC

## 2015-04-21 MED ORDER — ACETAMINOPHEN 500 MG PO TABS
1000.0000 mg | ORAL_TABLET | Freq: Once | ORAL | Status: AC
  Administered 2015-04-21: 1000 mg via ORAL

## 2015-04-21 MED ORDER — BENZONATATE 100 MG PO CAPS: 100.0000 mg | ORAL_CAPSULE | Freq: Three times a day (TID) | ORAL | Status: DC | PRN

## 2015-04-21 NOTE — ED Notes (Signed)
Since yesterday. Non-productive cough. Body aches. Headache and facial pain. Sore throat.

## 2015-04-21 NOTE — ED Provider Notes (Signed)
CSN: [id number]     Arrival date & time 04/21/15  4917 History   First MD Initiated Contact with Patient 04/21/15 1045     Chief Complaint  Patient presents with  . Cough  . Generalized Body Aches   (Consider location/radiation/quality/duration/timing/severity/associated sxs/prior Treatment) HPI  70 yo F with cough since yesterday. Has been traveling, feels worn out.  Daughter and grandson reportedly flu positive- but she has not spent time with them. She is a non smoker but lived 8 years with 2ppd husband . Used to use Singulair - has new PCP who doesnt Rx  Sleep disturbance with cough last night. No recent CXR. Afebrile . Fatigued. Hx Flonase- currently out - takes Loratadine 10 mg daily Past Medical History  Diagnosis Date  . SVT (supraventricular tachycardia) (HCC)     history of  . Pneumonia   . TIA (transient ischemic attack) 2010    Negative carotids.   . Diverticulosis   . Hypercalcemia     Secondar to calcium supplements. Normal PTH   Past Surgical History  Procedure Laterality Date  . Appendectomy  1954  . Cholecystectomy  1981  . Cardiac catheterization  [id number]   Family History  Problem Relation Age of Onset  . Heart disease Father   . Dementia Father   . Heart disease Mother   . Stroke    . Heart disease Brother    Social History  Substance Use Topics  . Smoking status: Never Smoker   . Smokeless tobacco: Never Used  . Alcohol Use: No   OB History    No data available     Review of Systems Constitutional: No fever. No headache. Eyes: No visual changes. ENT:No sore throat. Cardiovascular:Negative for chest pain/palpitations Respiratory: Negative for shortness of breath, positive cough- non-productive, wheeze Gastrointestinal: No abdominal pain. No nausea,vomiting, diarrhea. No particular appetite,drinking water Genitourinary: Negative for dysuria. Normal urination. Musculoskeletal: Negative for back pain. FROM extremities without pain Skin:  Negative for rash Neurological: Negative for headache, focal weakness or numbness  Allergies  Fish oil and Simvastatin  Home Medications   Prior to Admission medications   Medication Sig Start Date End Date Taking? Authorizing Provider  ALPRAZolam Duanne Moron) 0.5 MG tablet Take 0.5 mg by mouth at bedtime as needed for sleep. Reported on 03/06/2015   Yes Historical Provider, MD  aspirin 81 MG tablet Take 81 mg by mouth daily.   Yes Historical Provider, MD  Calcium Carbonate-Vitamin D (CALTRATE 600+D PO) Take by mouth daily.    Yes Historical Provider, MD  Cholecalciferol (VITAMIN D) 2000 UNITS tablet Take 2,000 Units by mouth daily.   Yes Historical Provider, MD  clopidogrel (PLAVIX) 75 MG tablet TAKE (1) TABLET BY MOUTH EVERY DAY WITH BREAKFAST 07/23/14  Yes Minna Merritts, MD  cyanocobalamin (,VITAMIN B-12,) 1000 MCG/ML injection INJECT 1 ML INTRAMUSCULARLY MONTHLY. 02/27/15  Yes Birdie Sons, MD  cyclobenzaprine (FLEXERIL) 10 MG tablet Take 0.5-1 tablets (5-10 mg total) by mouth at bedtime as needed for muscle spasms. 12/28/14  Yes Birdie Sons, MD  ezetimibe (ZETIA) 10 MG tablet Take 1 tablet (10 mg total) by mouth daily. 03/12/15  Yes Birdie Sons, MD  Fluticasone Furoate-Vilanterol (BREO ELLIPTA) 100-25 MCG/INH AEPB Inhale into the lungs. 11/08/13  Yes Historical Provider, MD  ibandronate (BONIVA) 150 MG tablet 1 tablet once a month. 08/31/14  Yes Birdie Sons, MD  ibuprofen (ADVIL,MOTRIN) 200 MG tablet Take 200 mg by mouth every 6 (six) hours as needed.  Yes Historical Provider, MD  loratadine (CLARITIN) 10 MG tablet Take 10 mg by mouth daily.   Yes Historical Provider, MD  metoprolol succinate (TOPROL-XL) 50 MG 24 hr tablet TAKE ONE (1) TABLET BY MOUTH ONCE DAILY 11/26/14  Yes Minna Merritts, MD  montelukast (SINGULAIR) 10 MG tablet Take 10 mg by mouth at bedtime.   Yes Historical Provider, MD  pantoprazole (PROTONIX) 40 MG tablet Take 1 tablet (40 mg total) by mouth daily. 03/12/15   Yes Birdie Sons, MD  vitamin B-12 (CYANOCOBALAMIN) 1000 MCG tablet Take 1,000 mcg by mouth daily.   Yes Historical Provider, MD  vitamin C (ASCORBIC ACID) 500 MG tablet Take 500 mg by mouth daily.   Yes Historical Provider, MD  vitamin E 1000 UNIT capsule Take 1,000 Units by mouth daily.   Yes Historical Provider, MD  albuterol (PROVENTIL HFA;VENTOLIN HFA) 108 (90 Base) MCG/ACT inhaler Inhale 1-2 puffs into the lungs every 6 (six) hours as needed for wheezing or shortness of breath. 04/21/15   Jan Fireman, PA-C  benzonatate (TESSALON) 100 MG capsule Take 1 capsule (100 mg total) by mouth 3 (three) times daily as needed. 04/21/15   Jan Fireman, PA-C  fluticasone [facility]) 50 MCG/ACT nasal spray Place 2 sprays into both nostrils daily. 04/21/15   Jan Fireman, PA-C  oseltamivir (TAMIFLU) 75 MG capsule Take 1 capsule (75 mg total) by mouth every 12 (twelve) hours. 04/21/15   Jan Fireman, PA-C  predniSONE (DELTASONE) 10 MG tablet Tablets 6 on day one, Reduce by one tablet daily -5 -4- 3-2-1    Total 6 days 04/21/15   Jan Fireman, PA-C   Meds Ordered and Administered this Visit   Medications  ipratropium-albuterol (DUONEB) 0.5-2.5 (3) MG/3ML nebulizer solution 3 mL (3 mLs Nebulization Given 04/21/15 1101)  ondansetron (ZOFRAN-ODT) disintegrating tablet 8 mg (8 mg Oral Given 04/21/15 1246)  acetaminophen (TYLENOL) tablet 1,000 mg (1,000 mg Oral Given 04/21/15 1249)    BP 179/76 mmHg  Pulse 106  Temp(Src) 101 F (38.3 C) (Tympanic)  Resp 18  Ht 5' 2.5" (1.588 m)  Wt 162 lb (73.483 kg)  BMI 29.14 kg/m2  SpO2 97% No data found.   Physical Exam  Arrival VS afebrile WNL  Constitutional -alert and oriented,tired appearing, active coughing-non-productive General: Mild distress,fatugued Head-atraumatic, normocephalic Eyes- conjunctiva normal, EOMI ,conjugate gaze Ears: grossly normal hearing, canals TMs neg Nose- positive congestion, no  rhinorrhea Mouth/throat- mucous membranes moist  ,oropharynx non-erythematous Neck- supple without glandular enlargement CV- regular rate and rhythmn Resp-, normal respiratory effort,no accessory use; wheeze bilaterally , deep cough, non-productive GI- soft,non-tender,no distention GU-  not examined MSK- non tender, normal ROM, ambulatory, no gait instability,on /off table solo- Drove self, ambulatory Neuro- normal speech and language, no gross focal neurological deficit appreciated,  Skin-warm,dry ,intact;  Psych-mood and affect grossly normal; speech and behavior grossly normal  ED Course  Procedures (including critical care time)  Labs Review Labs Reviewed  RAPID INFLUENZA A&B ANTIGENS ([place])   Results for orders placed or performed during the hospital encounter of 04/21/15  Rapid Influenza A&B Antigens ([facility] only)  Result Value Ref Range   Influenza A ([place]) NOT DETECTED    Influenza B ([facility]) NOT DETECTED    Presented with cough-tired from traveling and unable to sleep with cough disturbance. Feels tired.  Wheezing resolved with DuoNeb and she felt improved. Flu swabs neg . Cough increasing deep, with mild headache. Previous hx of pneumonia. CXR neg for  active. Patient defers prednisone Rx and Tussionex. Prepared her for discharge with Benzonatate and albuterol , rest , fluids/hydration/steam and resume loratadine and fluicasone.Drinking water and eager to go home. Rounding for discharge she reported onset nausea and increased but mild, headache.. Discharge held and Rx zofran , pulse noted to be up and temp 100.4. Treat w 1 gm acetominophen. Half hour later the picture evolving to look like flu.Temp 101, p106.  Nausea improved and taking sips of water. Discussed with Dr Alveta Heimlich.Patient very eager to go home.Rec Prednisone taper and Tamiflu.. D/C home with Tamiflu-cyclic tylenol and ibuprofen-rest/hydration. Discussed recommendation for prednisone taper and she agrees.  Discussed with patient -rec driver. Adult granddaughter   presented to drive and to stay with her. Son will be available in town later today as well.    MDM   1. Cough   2. Influenza    Plan: Test/x-ray results and diagnosis reviewed with patient/ granddaughter Rx as per orders;  benefits, risks, potential side effects reviewed   Recommend supportive treatment with cyclic tylenol and ibuprofen and above.  Questions fielded, expectations and recommendations reviewed. Discussed follow up and return parameters including no resolution or any worsening condition.  Patient and granddaughter express understanding  and agree to plan. Report to ER of choice should additional care be needed, .questions, concerns or exacerbation.   Discharge Medication List as of 04/21/2015  1:45 PM    START taking these medications   Details  albuterol (PROVENTIL HFA;VENTOLIN HFA) 108 (90 Base) MCG/ACT inhaler Inhale 1-2 puffs into the lungs every 6 (six) hours as needed for wheezing or shortness of breath., Starting 04/21/2015, Until Discontinued, Normal    benzonatate (TESSALON) 100 MG capsule Take 1 capsule (100 mg total) by mouth 3 (three) times daily as needed., Starting 04/21/2015, Until Discontinued, Print    oseltamivir (TAMIFLU) 75 MG capsule Take 1 capsule (75 mg total) by mouth every 12 (twelve) hours., Starting 04/21/2015, Until Discontinued, Normal    predniSONE (DELTASONE) 10 MG tablet Tablets 6 on day one, Reduce by one tablet daily -5 -4- 3-2-1    Total 6 days, Normal          Jan Fireman, PA-C 04/22/15 [phone]

## 2015-04-21 NOTE — Discharge Instructions (Signed)
As we have discussed- your presentation is evolving to look like you have the flu.. Please take Tamiflu 75 mg twice daily --- Taper dose Prednisone day1 6 tablets then 5-4-3-2-1 over next 5 days Contact PCP in AM with an update  Do not delay going to the ER if you feel you are getting worse  Continue Loratadine 1 daily Fluticasone nasal spray 1 spray per nostril twice daily for 2-3 days then reduce to daily Benzonatate (footballs) swallow-do not chew- up to 3 x day for cough Inhaler for wheezing  Please contact PCP with update- and report to them or to [place] if you have increased Symptoms Your flu test was currently negative- but we want to take care of the patient-not the test! Please make a follow up appointment with your PCP Cough, Adult Coughing is a reflex that clears your throat and your airways. Coughing helps to heal and protect your lungs. It is normal to cough occasionally, but a cough that happens with other symptoms or lasts a long time may be a sign of a condition that needs treatment. A cough may last only 2-3 weeks (acute), or it may last longer than 8 weeks (chronic). CAUSES Coughing is commonly caused by:  Breathing in substances that irritate your lungs.  A viral or bacterial respiratory infection.  Allergies.  Asthma.  Postnasal drip.  Smoking.  Acid backing up from the stomach into the esophagus (gastroesophageal reflux).  Certain medicines.  Chronic lung problems, including COPD (or rarely, lung cancer).  Other medical conditions such as heart failure. HOME CARE INSTRUCTIONS  Pay attention to any changes in your symptoms. Take these actions to help with your discomfort:  Take medicines only as told by your health care provider.  If you were prescribed an antibiotic medicine, take it as told by your health care provider. Do not stop taking the antibiotic even if you start to feel better.  Talk with your health care provider before you take a cough  suppressant medicine.  Drink enough fluid to keep your urine clear or pale yellow.  If the air is dry, use a cold steam vaporizer or humidifier in your bedroom or your home to help loosen secretions.  Avoid anything that causes you to cough at work or at home.  If your cough is worse at night, try sleeping in a semi-upright position.  Avoid cigarette smoke. If you smoke, quit smoking. If you need help quitting, ask your health care provider.  Avoid caffeine.  Avoid alcohol.  Rest as needed. SEEK MEDICAL CARE IF:   You have new symptoms.  You cough up pus.  Your cough does not get better after 2-3 weeks, or your cough gets worse.  You cannot control your cough with suppressant medicines and you are losing sleep.  You develop pain that is getting worse or pain that is not controlled with pain medicines.  You have a fever.  You have unexplained weight loss.  You have night sweats. SEEK IMMEDIATE MEDICAL CARE IF:  You cough up blood.  You have difficulty breathing.  Your heartbeat is very fast.   This information is not intended to replace advice given to you by your health care provider. Make sure you discuss any questions you have with your health care provider.   Document Released: 08/15/2010 Document Revised: 11/07/2014 Document Reviewed: 04/25/2014 Elsevier Interactive Patient Education [employer].

## 2015-04-22 ENCOUNTER — Encounter: Payer: Self-pay | Admitting: Physician Assistant

## 2015-04-30 ENCOUNTER — Ambulatory Visit (INDEPENDENT_AMBULATORY_CARE_PROVIDER_SITE_OTHER): Payer: Medicare Other | Admitting: Family Medicine

## 2015-04-30 ENCOUNTER — Encounter: Payer: Self-pay | Admitting: Family Medicine

## 2015-04-30 VITALS — BP 142/68 | HR 61 | Temp 98.0°F | Resp 18 | Wt 159.0 lb

## 2015-04-30 DIAGNOSIS — J40 Bronchitis, not specified as acute or chronic: Secondary | ICD-10-CM

## 2015-04-30 DIAGNOSIS — R05 Cough: Secondary | ICD-10-CM | POA: Diagnosis not present

## 2015-04-30 DIAGNOSIS — R059 Cough, unspecified: Secondary | ICD-10-CM

## 2015-04-30 MED ORDER — BUDESONIDE-FORMOTEROL FUMARATE 160-4.5 MCG/ACT IN AERO: 2.0000 | INHALATION_SPRAY | Freq: Two times a day (BID) | RESPIRATORY_TRACT | Status: DC

## 2015-04-30 MED ORDER — AZITHROMYCIN 250 MG PO TABS: ORAL_TABLET | ORAL | Status: AC

## 2015-04-30 NOTE — Progress Notes (Signed)
Patient: Jade Gardner Female    DOB: June 15, 1945   70 y.o.   MRN: [id number] Visit Date: 04/30/2015  Today's Provider: Lelon Huh, MD   Chief Complaint  Patient presents with  . Cough   Subjective:    HPI Cough: Patient comes in today stating she has had deep  non productive cough for 9 days that seems to be worsening. Patient was seen at an Urgent Care on 04/21/2015.Flu swab and chest x ray were negative. Patient was prescribed Benzonate , Albuterol, Tamiflu and prednisone. Patient was to continue Loratadine and Fluticasone. At the time of UC visit she was having fever fevers, chills, aches and malaise which are all much better, however cough and fatigue have not improved. She has tried taking Mucinex DM without relief of cough. Cough is worse at night and is keeping herawake at night. Patient states she feels weak and has pain in her chest when she coughs. Patient denies any  fevers, chills, sweats or body aches since being seen at the Urgent care.     Allergies  Allergen Reactions  . Fish Oil   . Simvastatin     Other reaction(s): Muscle Pain   Previous Medications   ALBUTEROL (PROVENTIL HFA;VENTOLIN HFA) 108 (90 BASE) MCG/ACT INHALER    Inhale 1-2 puffs into the lungs every 6 (six) hours as needed for wheezing or shortness of breath.   ALPRAZOLAM (XANAX) 0.5 MG TABLET    Take 0.5 mg by mouth at bedtime as needed for sleep. Reported on 03/06/2015   ASPIRIN 81 MG TABLET    Take 81 mg by mouth daily.   CALCIUM CARBONATE-VITAMIN D (CALTRATE 600+D PO)    Take by mouth daily.    CHOLECALCIFEROL (VITAMIN D) 2000 UNITS TABLET    Take 2,000 Units by mouth daily.   CLOPIDOGREL (PLAVIX) 75 MG TABLET    TAKE (1) TABLET BY MOUTH EVERY DAY WITH BREAKFAST   CYANOCOBALAMIN (,VITAMIN B-12,) 1000 MCG/ML INJECTION    INJECT 1 ML INTRAMUSCULARLY MONTHLY.   CYCLOBENZAPRINE (FLEXERIL) 10 MG TABLET    Take 0.5-1 tablets (5-10 mg total) by mouth at bedtime as needed for muscle spasms.   EZETIMIBE (ZETIA) 10 MG TABLET    Take 1 tablet (10 mg total) by mouth daily.   FLUTICASONE (FLONASE) 50 MCG/ACT NASAL SPRAY    Place 2 sprays into both nostrils daily.   IBANDRONATE (BONIVA) 150 MG TABLET    1 tablet once a month.   IBUPROFEN (ADVIL,MOTRIN) 200 MG TABLET    Take 200 mg by mouth every 6 (six) hours as needed.   LORATADINE (CLARITIN) 10 MG TABLET    Take 10 mg by mouth daily.   METOPROLOL SUCCINATE (TOPROL-XL) 50 MG 24 HR TABLET    TAKE ONE (1) TABLET BY MOUTH ONCE DAILY   PANTOPRAZOLE (PROTONIX) 40 MG TABLET    Take 1 tablet (40 mg total) by mouth daily.   VITAMIN B-12 (CYANOCOBALAMIN) 1000 MCG TABLET    Take 1,000 mcg by mouth daily.   VITAMIN C (ASCORBIC ACID) 500 MG TABLET    Take 500 mg by mouth daily.   VITAMIN E 1000 UNIT CAPSULE    Take 1,000 Units by mouth daily.    Review of Systems  Constitutional: Positive for fatigue. Negative for fever, chills and appetite change.  Respiratory: Positive for cough. Negative for chest tightness and shortness of breath.   Cardiovascular: Positive for chest pain (when coughing). Negative for palpitations.  Gastrointestinal:  Negative for nausea, vomiting and abdominal pain.  Neurological: Positive for dizziness, weakness, light-headedness and headaches.    Social History  Substance Use Topics  . Smoking status: Never Smoker   . Smokeless tobacco: Never Used  . Alcohol Use: No   Objective:   BP 142/68 mmHg  Pulse 61  Temp(Src) 98 F (36.7 C) (Oral)  Resp 18  Wt 159 lb (72.122 kg)  SpO2 97%  Physical Exam  General Appearance:    Alert, cooperative, no distress  HENT:   ENT exam normal, no neck nodes or sinus tenderness  Eyes:    PERRL, conjunctiva/corneas clear, EOM's intact       Lungs:     Occasional expiratory wheeze, no rales or rhonchi, respirations unlabored  Heart:    Regular rate and rhythm  Neurologic:   Awake, alert, oriented x 3. No apparent focal neurological           defect.           Assessment &  Plan:     1. Cough Post-viral infection, may have secondary bacterial bronchitis.  - budesonide-formoterol (SYMBICORT) 160-4.5 MCG/ACT inhaler; Inhale 2 puffs into the lungs 2 (two) times daily.  Dispense: 1 Inhaler; Refill: 0  2. Bronchitis  - azithromycin (ZITHROMAX) 250 MG tablet; 2 by mouth today, then 1 daily for 4 days  Dispense: 6 tablet; Refill: 0   Call if symptoms change or if not rapidly improving.          Lelon Huh, MD  [facility]

## 2015-05-01 ENCOUNTER — Telehealth: Payer: Self-pay

## 2015-05-01 MED ORDER — BENZONATATE 100 MG PO CAPS: 100.0000 mg | ORAL_CAPSULE | Freq: Three times a day (TID) | ORAL | Status: DC | PRN

## 2015-05-01 NOTE — Telephone Encounter (Signed)
Patient notified- Rx sent to pharmacy. 

## 2015-05-01 NOTE — Telephone Encounter (Signed)
Patient called stating she took the Symbicort last night at 11pm for the first time and woke up an hour later with chest pain. She states it felt like her chest was going to burst open. The pain last 10-15 minutes then started to ease off and eventually resolved. Patient states her son was going to take her to the ER but she decided not to go because the pain resolved. Patient states she tried to go back to sleep but was up all night due to excessive coughing. Patient would like something to help with cough. Patient has not had anymore episodes of chest pain since that first episode. Patient is at work right now. She denies any shortness of breath. She states she just feels tired from a lack of sleep. Patient wanted Dr. Caryn Section to be aware of the chest pain episode and also she wants something to help with cough.

## 2015-05-01 NOTE — Telephone Encounter (Signed)
May send in rx for tessalon 135m one every eight hours as needed for cough. #30, no refills.  May take this with OTC Mucinex or Mucinex-DM

## 2015-05-29 DIAGNOSIS — K219 Gastro-esophageal reflux disease without esophagitis: Secondary | ICD-10-CM | POA: Diagnosis not present

## 2015-05-29 DIAGNOSIS — J411 Mucopurulent chronic bronchitis: Secondary | ICD-10-CM | POA: Insufficient documentation

## 2015-05-29 DIAGNOSIS — K7581 Nonalcoholic steatohepatitis (NASH): Secondary | ICD-10-CM | POA: Insufficient documentation

## 2015-05-29 DIAGNOSIS — M545 Low back pain: Secondary | ICD-10-CM | POA: Diagnosis not present

## 2015-05-29 DIAGNOSIS — R252 Cramp and spasm: Secondary | ICD-10-CM | POA: Diagnosis not present

## 2015-05-29 DIAGNOSIS — Z8739 Personal history of other diseases of the musculoskeletal system and connective tissue: Secondary | ICD-10-CM | POA: Diagnosis not present

## 2015-05-29 DIAGNOSIS — Z8673 Personal history of transient ischemic attack (TIA), and cerebral infarction without residual deficits: Secondary | ICD-10-CM | POA: Insufficient documentation

## 2015-06-23 ENCOUNTER — Ambulatory Visit
Admission: EM | Admit: 2015-06-23 | Discharge: 2015-06-23 | Disposition: A | Discharge status: Home / Self Care | Payer: Medicare Other | Attending: Family Medicine | Admitting: Family Medicine

## 2015-06-23 ENCOUNTER — Encounter: Payer: Self-pay | Admitting: Gynecology

## 2015-06-23 DIAGNOSIS — Z8249 Family history of ischemic heart disease and other diseases of the circulatory system: Secondary | ICD-10-CM | POA: Insufficient documentation

## 2015-06-23 DIAGNOSIS — N390 Urinary tract infection, site not specified: Secondary | ICD-10-CM

## 2015-06-23 DIAGNOSIS — Z7982 Long term (current) use of aspirin: Secondary | ICD-10-CM | POA: Insufficient documentation

## 2015-06-23 DIAGNOSIS — Z79899 Other long term (current) drug therapy: Secondary | ICD-10-CM | POA: Insufficient documentation

## 2015-06-23 DIAGNOSIS — Z8673 Personal history of transient ischemic attack (TIA), and cerebral infarction without residual deficits: Secondary | ICD-10-CM | POA: Insufficient documentation

## 2015-06-23 LAB — URINALYSIS COMPLETE WITH MICROSCOPIC (ARMC ONLY)
BILIRUBIN URINE: NEGATIVE
GLUCOSE, UA: NEGATIVE mg/dL
Ketones, ur: NEGATIVE mg/dL
NITRITE: POSITIVE — AB
Protein, ur: 30 mg/dL — AB
Specific Gravity, Urine: 1.02 (ref 1.005–1.030)
Squamous Epithelial / LPF: NONE SEEN
pH: 6 (ref 5.0–8.0)

## 2015-06-23 MED ORDER — PHENAZOPYRIDINE HCL 200 MG PO TABS: 200.0000 mg | ORAL_TABLET | Freq: Three times a day (TID) | ORAL | Status: DC | PRN

## 2015-06-23 MED ORDER — NITROFURANTOIN MONOHYD MACRO 100 MG PO CAPS: 100.0000 mg | ORAL_CAPSULE | Freq: Two times a day (BID) | ORAL | Status: DC

## 2015-06-23 NOTE — ED Provider Notes (Signed)
CSN: [id number]     Arrival date & time 06/23/15  0831 History   First MD Initiated Contact with Patient 06/23/15 [id number]    Nurses notes were reviewed. Chief Complaint  Patient presents with  . Urinary Tract Infection   Patient reports pain in the back started yesterday and dysuria. She reports the dysuria has improved somewhat but that she's also had frequency but just a little bit amount of urine produced. Last UTI was about 2 years ago she doesn't get them often but she has gotten before. The last time she had a UTI was mostly lower abdominal pain and discomfort.  She does not smoke she has history of hyperlipidemia SVT she has had one TIA before.. Surgeries appendectomy cholecystectomy and cardiac catheterization. Her father had a history of heart disease and dementia and her mother has had heart disease and she has a granddaughter born with congenital heart disease. She is allergic to fish all and simvastatin.   (Consider location/radiation/quality/duration/timing/severity/associated sxs/prior Treatment) Patient is a 70 y.o. female presenting with urinary tract infection. The history is provided by the patient. No language interpreter was used.  Urinary Tract Infection Pain quality:  Burning Pain severity:  Moderate Onset quality:  Sudden Duration:  2 days Progression:  Waxing and waning Chronicity:  New Recent urinary tract infections: no   Ineffective treatments:  Antibiotics Urinary symptoms: foul-smelling urine and frequent urination     Past Medical History  Diagnosis Date  . SVT (supraventricular tachycardia) (HCC)     history of  . Pneumonia   . TIA (transient ischemic attack) 2010    Negative carotids.   . Diverticulosis   . Hypercalcemia     Secondar to calcium supplements. Normal PTH   Past Surgical History  Procedure Laterality Date  . Appendectomy  1954  . Cholecystectomy  1981  . Cardiac catheterization  [id number]   Family History  Problem Relation Age of  Onset  . Heart disease Father   . Dementia Father   . Heart disease Mother   . Stroke    . Heart disease Brother    Social History  Substance Use Topics  . Smoking status: Never Smoker   . Smokeless tobacco: Never Used  . Alcohol Use: No   OB History    No data available     Review of Systems  All other systems reviewed and are negative.   Allergies  Fish oil and Simvastatin  Home Medications   Prior to Admission medications   Medication Sig Start Date End Date Taking? Authorizing Provider  albuterol (PROVENTIL HFA;VENTOLIN HFA) 108 (90 Base) MCG/ACT inhaler Inhale 1-2 puffs into the lungs every 6 (six) hours as needed for wheezing or shortness of breath. 04/21/15  Yes Jan Fireman, PA-C  ALPRAZolam Duanne Moron) 0.5 MG tablet Take 0.5 mg by mouth at bedtime as needed for sleep. Reported on 03/06/2015   Yes Historical Provider, MD  aspirin 81 MG tablet Take 81 mg by mouth daily.   Yes Historical Provider, MD  Calcium Carbonate-Vitamin D (CALTRATE 600+D PO) Take by mouth daily.    Yes Historical Provider, MD  Cholecalciferol (VITAMIN D) 2000 UNITS tablet Take 2,000 Units by mouth daily.   Yes Historical Provider, MD  clopidogrel (PLAVIX) 75 MG tablet TAKE (1) TABLET BY MOUTH EVERY DAY WITH BREAKFAST 07/23/14  Yes Minna Merritts, MD  cyclobenzaprine (FLEXERIL) 10 MG tablet Take 0.5-1 tablets (5-10 mg total) by mouth at bedtime as needed for muscle spasms. 12/28/14  Yes  Birdie Sons, MD  fluticasone (FLONASE) 50 MCG/ACT nasal spray Place 2 sprays into both nostrils daily. 04/21/15  Yes Jan Fireman, PA-C  ibandronate (BONIVA) 150 MG tablet 1 tablet once a month. 08/31/14  Yes Birdie Sons, MD  ibuprofen (ADVIL,MOTRIN) 200 MG tablet Take 200 mg by mouth every 6 (six) hours as needed.   Yes Historical Provider, MD  loratadine (CLARITIN) 10 MG tablet Take 10 mg by mouth daily.   Yes Historical Provider, MD  metoprolol succinate (TOPROL-XL) 50 MG 24 hr tablet TAKE ONE (1) TABLET BY MOUTH  ONCE DAILY 11/26/14  Yes Minna Merritts, MD  pantoprazole (PROTONIX) 40 MG tablet Take 1 tablet (40 mg total) by mouth daily. 03/12/15  Yes Birdie Sons, MD  vitamin B-12 (CYANOCOBALAMIN) 1000 MCG tablet Take 1,000 mcg by mouth daily.   Yes Historical Provider, MD  vitamin C (ASCORBIC ACID) 500 MG tablet Take 500 mg by mouth daily.   Yes Historical Provider, MD  vitamin E 1000 UNIT capsule Take 1,000 Units by mouth daily.   Yes Historical Provider, MD  benzonatate (TESSALON) 100 MG capsule Take 1 capsule (100 mg total) by mouth every 8 (eight) hours as needed for cough. 05/01/15   Birdie Sons, MD  budesonide-formoterol [facility]) 160-4.5 MCG/ACT inhaler Inhale 2 puffs into the lungs 2 (two) times daily. 04/30/15 05/14/15  Birdie Sons, MD  cyanocobalamin (,VITAMIN B-12,) 1000 MCG/ML injection INJECT 1 ML INTRAMUSCULARLY MONTHLY. 02/27/15   Birdie Sons, MD  ezetimibe (ZETIA) 10 MG tablet Take 1 tablet (10 mg total) by mouth daily. 03/12/15   Birdie Sons, MD  nitrofurantoin, macrocrystal-monohydrate, (MACROBID) 100 MG capsule Take 1 capsule (100 mg total) by mouth 2 (two) times daily. 06/23/15   Frederich Cha, MD  phenazopyridine (PYRIDIUM) 200 MG tablet Take 1 tablet (200 mg total) by mouth 3 (three) times daily as needed for pain. 06/23/15   Frederich Cha, MD   Meds Ordered and Administered this Visit  Medications - No data to display  BP 142/86 mmHg  Pulse 62  Temp(Src) 97.7 F (36.5 C) (Oral)  Ht 5' 2"  (1.575 m)  Wt 160 lb (72.576 kg)  BMI 29.26 kg/m2  SpO2 99% No data found.   Physical Exam  Constitutional: She is oriented to person, place, and time. She appears well-developed and well-nourished.  HENT:  Head: Normocephalic and atraumatic.  Eyes: Conjunctivae are normal.  Neck: Neck supple.  Abdominal: Soft. Normal appearance. There is no tenderness. There is CVA tenderness.  Musculoskeletal: Normal range of motion.  Neurological: She is alert and oriented to person,  place, and time.  Skin: Skin is warm and dry.  Psychiatric: She has a normal mood and affect.  Vitals reviewed.   ED Course  Procedures (including critical care time)  Labs Review Labs Reviewed  URINALYSIS COMPLETEWITH MICROSCOPIC ([facility] ONLY) - Abnormal; Notable for the following:    Color, Urine STRAW (*)    APPearance CLOUDY (*)    Hgb urine dipstick 2+ (*)    Protein, ur 30 (*)    Nitrite POSITIVE (*)    Leukocytes, UA 3+ (*)    Bacteria, UA MANY (*)    All other components within normal limits  URINE CULTURE    Imaging Review No results found.   Visual Acuity Review  Right Eye Distance:   Left Eye Distance:   Bilateral Distance:    Right Eye Near:   Left Eye Near:    Bilateral Near:  Results for orders placed or performed during the hospital encounter of 06/23/15  Urinalysis complete, with microscopic  Result Value Ref Range   Color, Urine STRAW (A) YELLOW   APPearance CLOUDY (A) CLEAR   Glucose, UA NEGATIVE NEGATIVE mg/dL   Bilirubin Urine NEGATIVE NEGATIVE   Ketones, ur NEGATIVE NEGATIVE mg/dL   Specific Gravity, Urine 1.020 1.005 - 1.030   Hgb urine dipstick 2+ (A) NEGATIVE   pH 6.0 5.0 - 8.0   Protein, ur 30 (A) NEGATIVE mg/dL   Nitrite POSITIVE (A) NEGATIVE   Leukocytes, UA 3+ (A) NEGATIVE   RBC / HPF 0-5 0 - 5 RBC/hpf   WBC, UA TOO NUMEROUS TO COUNT 0 - 5 WBC/hpf   Bacteria, UA MANY (A) NONE SEEN   Squamous Epithelial / LPF NONE SEEN NONE SEEN     MDM   1. UTI (lower urinary tract infection)    Patient is consistent with a UTI. Exertional Pyridium and Macrobid sent to the pharmacy suspicious 6. Culture the urine pending. Follow-up Dr. Caryn Section if needed  next 2 weeks to 3 weeks.   Note: This dictation was prepared with Dragon dictation along with smaller phrase technology. Any transcriptional errors that result from this process are unintentional.  Frederich Cha, MD 06/23/15 [phone]

## 2015-06-23 NOTE — ED Notes (Signed)
Patient c/o UTI. Per patient x yesterday lower back pain / painful urination / odor.

## 2015-06-23 NOTE — Discharge Instructions (Signed)

## 2015-06-25 ENCOUNTER — Telehealth: Payer: Self-pay

## 2015-06-25 LAB — URINE CULTURE

## 2015-06-25 NOTE — Telephone Encounter (Signed)
Pyridium can cause nausea, but should not cause stomach pain. She needs to o.v. If she is still having stomach pain.

## 2015-06-25 NOTE — Telephone Encounter (Signed)
Appointment scheduled and pt was notified.

## 2015-06-25 NOTE — Telephone Encounter (Signed)
Patient called saying that she is having nausea and stomach pain since 12:30 this morning. She reports that she went to the urgent care 2 days ago due to UTI symptoms and was prescribed Macrobid and pyridium. She reports that she took pyridium last night and thinks that this has caused her pain and nausea. Patient is wanting to know does she continue taking this medication? Please call patient at [phone]. Thanks!

## 2015-06-25 NOTE — Telephone Encounter (Signed)
Pt called back to get an update on what Dr. Caryn Section advises her to do. Please advise. Thanks TNP

## 2015-06-25 NOTE — Telephone Encounter (Signed)
Yes, she can have the 9:45 same day slot. Thanks.

## 2015-06-25 NOTE — Telephone Encounter (Signed)
Patient has been advised as below, patient would like to be seen tomorrow in morning. Patient states that she is unable to come at any other time due to prior arrangements. Is it okay to place patient in a same day appt slot? You have available 9:45AM. Patient also wanted to know if she should still be taking the [place] as well? Please advise. KW

## 2015-06-26 ENCOUNTER — Encounter: Payer: Self-pay | Admitting: Family Medicine

## 2015-06-26 ENCOUNTER — Ambulatory Visit (INDEPENDENT_AMBULATORY_CARE_PROVIDER_SITE_OTHER): Payer: Medicare Other | Admitting: Family Medicine

## 2015-06-26 VITALS — BP 120/78 | HR 71 | Temp 97.7°F | Resp 16 | Wt 158.0 lb

## 2015-06-26 DIAGNOSIS — N390 Urinary tract infection, site not specified: Secondary | ICD-10-CM

## 2015-06-26 LAB — POCT URINALYSIS DIPSTICK
BILIRUBIN UA: NEGATIVE
GLUCOSE UA: NEGATIVE
KETONES UA: NEGATIVE
Nitrite, UA: POSITIVE
Protein, UA: NEGATIVE
SPEC GRAV UA: 1.015
Urobilinogen, UA: 0.2
pH, UA: 6

## 2015-06-26 MED ORDER — CIPROFLOXACIN HCL 500 MG PO TABS: 500.0000 mg | ORAL_TABLET | Freq: Two times a day (BID) | ORAL | Status: AC

## 2015-06-26 NOTE — Progress Notes (Signed)
Patient: Jade Gardner Female    DOB: June 05, 1945   70 y.o.   MRN: [id number] Visit Date: 06/26/2015  Today's Provider: Lelon Huh, MD   Chief Complaint  Patient presents with  . Abdominal Pain   Subjective:    Abdominal Pain This is a new problem. Episode onset: 2 days ago after starting Macrobid and Pyridium prescribed by Urgent care on 06/23/2015 for UTI. The problem has been gradually improving. The pain is located in the epigastric region. The quality of the pain is burning. Associated symptoms include anorexia, belching, diarrhea, dysuria, a fever (low grade per patient report), flatus, frequency, hematuria and nausea (severe; has improved). Pertinent negatives include no arthralgias, constipation, headaches, melena, myalgias or vomiting. The pain is aggravated by eating.  Patient states she stopped taking the Pyridium and Macrobid yesterday and abdominal pain and nausea have mostly resolved.    Results for orders placed or performed during the hospital encounter of 06/23/15  Urine culture  Result Value Ref Range   Specimen Description URINE, CLEAN CATCH    Special Requests NONE    Culture >=100,000 COLONIES/mL ESCHERICHIA COLI (A)    Report Status 06/25/2015 FINAL    Organism ID, Bacteria ESCHERICHIA COLI (A)       Susceptibility   Escherichia coli - MIC*    AMPICILLIN >=32 RESISTANT Resistant     CEFAZOLIN <=4 SENSITIVE Sensitive     CEFTRIAXONE <=1 SENSITIVE Sensitive     CIPROFLOXACIN <=0.25 SENSITIVE Sensitive     GENTAMICIN <=1 SENSITIVE Sensitive     IMIPENEM <=0.25 SENSITIVE Sensitive     NITROFURANTOIN <=16 SENSITIVE Sensitive     TRIMETH/SULFA <=20 SENSITIVE Sensitive     AMPICILLIN/SULBACTAM >=32 RESISTANT Resistant     PIP/TAZO <=4 SENSITIVE Sensitive     Extended ESBL NEGATIVE Sensitive     * >=100,000 COLONIES/mL ESCHERICHIA COLI  Urinalysis complete, with microscopic  Result Value Ref Range   Color, Urine STRAW (A) YELLOW   APPearance CLOUDY  (A) CLEAR   Glucose, UA NEGATIVE NEGATIVE mg/dL   Bilirubin Urine NEGATIVE NEGATIVE   Ketones, ur NEGATIVE NEGATIVE mg/dL   Specific Gravity, Urine 1.020 1.005 - 1.030   Hgb urine dipstick 2+ (A) NEGATIVE   pH 6.0 5.0 - 8.0   Protein, ur 30 (A) NEGATIVE mg/dL   Nitrite POSITIVE (A) NEGATIVE   Leukocytes, UA 3+ (A) NEGATIVE   RBC / HPF 0-5 0 - 5 RBC/hpf   WBC, UA TOO NUMEROUS TO COUNT 0 - 5 WBC/hpf   Bacteria, UA MANY (A) NONE SEEN   Squamous Epithelial / LPF NONE SEEN NONE SEEN    Results for orders placed or performed in visit on 06/26/15  POCT urinalysis dipstick  Result Value Ref Range   Color, UA yellow    Clarity, UA clear    Glucose, UA negative    Bilirubin, UA negative    Ketones, UA negative    Spec Grav, UA 1.015    Blood, UA trace    pH, UA 6.0    Protein, UA negative    Urobilinogen, UA 0.2    Nitrite, UA positive    Leukocytes, UA moderate (2+) (A) Negative       Allergies  Allergen Reactions  . Fish Oil   . Simvastatin     Other reaction(s): Muscle Pain   Previous Medications   ALBUTEROL (PROVENTIL HFA;VENTOLIN HFA) 108 (90 BASE) MCG/ACT INHALER    Inhale 1-2 puffs into the lungs  every 6 (six) hours as needed for wheezing or shortness of breath.   ALPRAZOLAM (XANAX) 0.5 MG TABLET    Take 0.5 mg by mouth at bedtime as needed for sleep. Reported on 03/06/2015   ASPIRIN 81 MG TABLET    Take 81 mg by mouth daily.   BENZONATATE (TESSALON) 100 MG CAPSULE    Take 1 capsule (100 mg total) by mouth every 8 (eight) hours as needed for cough.   BUDESONIDE-FORMOTEROL (SYMBICORT) 160-4.5 MCG/ACT INHALER    Inhale 2 puffs into the lungs 2 (two) times daily.   CALCIUM CARBONATE-VITAMIN D (CALTRATE 600+D PO)    Take by mouth daily.    CHOLECALCIFEROL (VITAMIN D) 2000 UNITS TABLET    Take 2,000 Units by mouth daily.   CLOPIDOGREL (PLAVIX) 75 MG TABLET    TAKE (1) TABLET BY MOUTH EVERY DAY WITH BREAKFAST   CYANOCOBALAMIN (,VITAMIN B-12,) 1000 MCG/ML INJECTION    INJECT 1  ML INTRAMUSCULARLY MONTHLY.   CYCLOBENZAPRINE (FLEXERIL) 10 MG TABLET    Take 0.5-1 tablets (5-10 mg total) by mouth at bedtime as needed for muscle spasms.   EZETIMIBE (ZETIA) 10 MG TABLET    Take 1 tablet (10 mg total) by mouth daily.   FLUTICASONE (FLONASE) 50 MCG/ACT NASAL SPRAY    Place 2 sprays into both nostrils daily.   IBANDRONATE (BONIVA) 150 MG TABLET    1 tablet once a month.   IBUPROFEN (ADVIL,MOTRIN) 200 MG TABLET    Take 200 mg by mouth every 6 (six) hours as needed.   LORATADINE (CLARITIN) 10 MG TABLET    Take 10 mg by mouth daily.   METOPROLOL SUCCINATE (TOPROL-XL) 50 MG 24 HR TABLET    TAKE ONE (1) TABLET BY MOUTH ONCE DAILY   NITROFURANTOIN, MACROCRYSTAL-MONOHYDRATE, (MACROBID) 100 MG CAPSULE    Take 1 capsule (100 mg total) by mouth 2 (two) times daily.   PANTOPRAZOLE (PROTONIX) 40 MG TABLET    Take 1 tablet (40 mg total) by mouth daily.   PHENAZOPYRIDINE (PYRIDIUM) 200 MG TABLET    Take 1 tablet (200 mg total) by mouth 3 (three) times daily as needed for pain.   VITAMIN B-12 (CYANOCOBALAMIN) 1000 MCG TABLET    Take 1,000 mcg by mouth daily.   VITAMIN C (ASCORBIC ACID) 500 MG TABLET    Take 500 mg by mouth daily.   VITAMIN E 1000 UNIT CAPSULE    Take 1,000 Units by mouth daily.    Review of Systems  Constitutional: Positive for fever (low grade per patient report). Negative for chills, appetite change and fatigue.  Respiratory: Negative for chest tightness and shortness of breath.   Cardiovascular: Negative for chest pain and palpitations.  Gastrointestinal: Positive for nausea (severe; has improved), abdominal pain, diarrhea, anorexia and flatus. Negative for vomiting, constipation and melena.  Genitourinary: Positive for dysuria, frequency and hematuria.  Musculoskeletal: Negative for myalgias and arthralgias.  Neurological: Negative for dizziness, weakness and headaches.    Social History  Substance Use Topics  . Smoking status: Never Smoker   . Smokeless tobacco:  Never Used  . Alcohol Use: No   Objective:   BP 120/78 mmHg  Pulse 71  Temp(Src) 97.7 F (36.5 C) (Oral)  Resp 16  Wt 158 lb (71.668 kg)  SpO2 95%  Physical Exam  General Appearance:    Alert, cooperative, no distress  Eyes:    PERRL, conjunctiva/corneas clear, EOM's intact       Lungs:     Clear to auscultation  bilaterally, respirations unlabored  Heart:    Regular rate and rhythm  Abdomen:   soft, round, nontender or nondistended. No CVA tenderness       Assessment & Plan:     1. Urinary tract infection without hematuria, site unspecified Did not tolerate combination of Cipro and pyridium.  - POCT urinalysis dipstick - ciprofloxacin (CIPRO) 500 MG tablet; Take 1 tablet (500 mg total) by mouth 2 (two) times daily.  Dispense: 14 tablet; Refill: 0        Lelon Huh, MD  [facility]

## 2015-07-01 ENCOUNTER — Telehealth: Payer: Self-pay | Admitting: Family Medicine

## 2015-07-01 MED ORDER — SULFAMETHOXAZOLE-TRIMETHOPRIM 800-160 MG PO TABS: 1.0000 | ORAL_TABLET | Freq: Two times a day (BID) | ORAL | Status: DC

## 2015-07-01 NOTE — Telephone Encounter (Signed)
L/M saying that Rx was sent into the pharmacy.

## 2015-07-01 NOTE — Telephone Encounter (Signed)
Have sent rx for Septra DS to [facility]

## 2015-07-01 NOTE — Telephone Encounter (Signed)
Pt stated that she thinks ciprofloxacin (CIPRO) 500 MG tablet has been making her sick on her stomach and would like something else sent to [facility] in [place]. Pt stated her granddaughter is getting married Saturday and she needs to be better by then. Please advise. Thanks TNP

## 2015-07-04 ENCOUNTER — Encounter: Payer: Self-pay | Admitting: Emergency Medicine

## 2015-07-04 ENCOUNTER — Ambulatory Visit
Admission: EM | Admit: 2015-07-04 | Discharge: 2015-07-04 | Disposition: A | Discharge status: Home / Self Care | Payer: Medicare Other | Attending: Family Medicine | Admitting: Family Medicine

## 2015-07-04 DIAGNOSIS — R109 Unspecified abdominal pain: Secondary | ICD-10-CM | POA: Diagnosis present

## 2015-07-04 DIAGNOSIS — Z8249 Family history of ischemic heart disease and other diseases of the circulatory system: Secondary | ICD-10-CM | POA: Diagnosis not present

## 2015-07-04 DIAGNOSIS — N390 Urinary tract infection, site not specified: Secondary | ICD-10-CM

## 2015-07-04 DIAGNOSIS — I471 Supraventricular tachycardia: Secondary | ICD-10-CM | POA: Insufficient documentation

## 2015-07-04 DIAGNOSIS — Z8673 Personal history of transient ischemic attack (TIA), and cerebral infarction without residual deficits: Secondary | ICD-10-CM | POA: Insufficient documentation

## 2015-07-04 DIAGNOSIS — Z823 Family history of stroke: Secondary | ICD-10-CM | POA: Insufficient documentation

## 2015-07-04 DIAGNOSIS — Z7982 Long term (current) use of aspirin: Secondary | ICD-10-CM | POA: Diagnosis not present

## 2015-07-04 DIAGNOSIS — K529 Noninfective gastroenteritis and colitis, unspecified: Secondary | ICD-10-CM | POA: Diagnosis not present

## 2015-07-04 DIAGNOSIS — Z79899 Other long term (current) drug therapy: Secondary | ICD-10-CM | POA: Diagnosis not present

## 2015-07-04 DIAGNOSIS — A080 Rotaviral enteritis: Secondary | ICD-10-CM

## 2015-07-04 DIAGNOSIS — R197 Diarrhea, unspecified: Secondary | ICD-10-CM | POA: Diagnosis present

## 2015-07-04 LAB — URINALYSIS COMPLETE WITH MICROSCOPIC (ARMC ONLY)
Bilirubin Urine: NEGATIVE
GLUCOSE, UA: NEGATIVE mg/dL
Ketones, ur: NEGATIVE mg/dL
Nitrite: NEGATIVE
PH: 5.5 (ref 5.0–8.0)
Protein, ur: 30 mg/dL — AB
Specific Gravity, Urine: 1.02 (ref 1.005–1.030)

## 2015-07-04 LAB — RAPID INFLUENZA A&B ANTIGENS: Influenza B (ARMC): NEGATIVE

## 2015-07-04 LAB — CBC WITH DIFFERENTIAL/PLATELET
Basophils Absolute: 0 10*3/uL (ref 0–0.1)
EOS ABS: 0.1 10*3/uL (ref 0–0.7)
Eosinophils Relative: 1 %
HCT: 49.3 % — ABNORMAL HIGH (ref 35.0–47.0)
Hemoglobin: 15.7 g/dL (ref 12.0–16.0)
Lymphocytes Relative: 6 %
Lymphs Abs: 0.8 10*3/uL — ABNORMAL LOW (ref 1.0–3.6)
MCH: 28.2 pg (ref 26.0–34.0)
MCHC: 31.8 g/dL — AB (ref 32.0–36.0)
MCV: 88.8 fL (ref 80.0–100.0)
MONO ABS: 0.4 10*3/uL (ref 0.2–0.9)
Neutro Abs: 12.3 10*3/uL — ABNORMAL HIGH (ref 1.4–6.5)
Neutrophils Relative %: 90 %
PLATELETS: 277 10*3/uL (ref 150–440)
RBC: 5.55 MIL/uL — ABNORMAL HIGH (ref 3.80–5.20)
RDW: 13 % (ref 11.5–14.5)
WBC: 13.7 10*3/uL — ABNORMAL HIGH (ref 3.6–11.0)

## 2015-07-04 LAB — LIPASE, BLOOD: LIPASE: 36 U/L (ref 11–51)

## 2015-07-04 LAB — AMYLASE: Amylase: 32 U/L (ref 28–100)

## 2015-07-04 LAB — RAPID INFLUENZA A&B ANTIGENS (ARMC ONLY): INFLUENZA A (ARMC): NEGATIVE

## 2015-07-04 MED ORDER — BISMUTH SUBSALICYLATE 262 MG/15ML PO SUSP: 30.0000 mL | Freq: Three times a day (TID) | ORAL | Status: DC

## 2015-07-04 MED ORDER — PROMETHAZINE HCL 25 MG/ML IJ SOLN
25.0000 mg | Freq: Once | INTRAMUSCULAR | Status: AC
  Administered 2015-07-04: 25 mg via INTRAMUSCULAR

## 2015-07-04 MED ORDER — RANITIDINE HCL 150 MG PO CAPS: 150.0000 mg | ORAL_CAPSULE | Freq: Three times a day (TID) | ORAL | Status: DC

## 2015-07-04 MED ORDER — CEFUROXIME AXETIL 500 MG PO TABS: 500.0000 mg | ORAL_TABLET | Freq: Two times a day (BID) | ORAL | Status: DC

## 2015-07-04 MED ORDER — CEFTRIAXONE SODIUM 1 G IJ SOLR
1.0000 g | Freq: Once | INTRAMUSCULAR | Status: AC
  Administered 2015-07-04: 1 g via INTRAMUSCULAR

## 2015-07-04 MED ORDER — DIPHENOXYLATE-ATROPINE 2.5-0.025 MG PO TABS: 1.0000 | ORAL_TABLET | Freq: Three times a day (TID) | ORAL | Status: DC | PRN

## 2015-07-04 NOTE — ED Provider Notes (Signed)
CSN: [id number]     Arrival date & time 07/04/15  1813 History   First MD Initiated Contact with Patient 07/04/15 1903     Nurses notes were reviewed. Chief Complaint  Patient presents with  . Emesis  . Diarrhea  . Abdominal Pain   Patient comes in with her daughter complaining of nausea and vomiting and diarrhea. Her son was diagnosed with the flu and was staying at her house. Flu test was ordered but in further questioning the food that he had was the intestinal flu and not respiratory flu. Patient also was diagnosed with a UTI about 2 weeks ago. Since then she's been unable to take the antibiotic was initially called in. Dr. Caryn Section called in last week Cipro but she was only able to take 1 dose before cramping throwing that up and unable to take that. She states that another antibiotic was called but she's been afraid to take that since she had the intolerance to the other 2 antibiotics. Thus we have a elderly female who's had nausea and vomiting on these 2 episodes which she try to take antibiotic for UTI she still complaining of a UTI and she has a son who had a stomach virus and now she does have persistent emesis diarrhea and abdominal pain.  His history is SVT TIAs and diverticulosis the past. She's had appendectomy cholecystectomy cardiac catheterization. Only pertinent family medical history is a son who has a rotavirus infection at home. Her father had heart disease and dementia and mother had heart disease and stroke. She never smoked.    (Consider location/radiation/quality/duration/timing/severity/associated sxs/prior Treatment) Patient is a 70 y.o. female presenting with vomiting, diarrhea, and abdominal pain. The history is provided by the patient and a relative. No language interpreter was used.  Emesis Severity:  Moderate Duration:  12 hours Timing:  Constant Quality:  Stomach contents Progression:  Unchanged Chronicity:  New Recent urination:  Increased Ineffective  treatments:  Antiemetics (She's tried Phenergan orally but threw that up she took 1 Imodium which slow down the diarrhea for a while but the diarrhea has started back up again) Associated symptoms: abdominal pain and diarrhea   Diarrhea Associated symptoms: abdominal pain and vomiting   Abdominal Pain Associated symptoms: diarrhea and vomiting     Past Medical History  Diagnosis Date  . SVT (supraventricular tachycardia) (HCC)     history of  . Pneumonia   . TIA (transient ischemic attack) 2010    Negative carotids.   . Diverticulosis   . Hypercalcemia     Secondar to calcium supplements. Normal PTH   Past Surgical History  Procedure Laterality Date  . Appendectomy  1954  . Cholecystectomy  1981  . Cardiac catheterization  [id number]   Family History  Problem Relation Age of Onset  . Heart disease Father   . Dementia Father   . Heart disease Mother   . Stroke    . Heart disease Brother    Social History  Substance Use Topics  . Smoking status: Never Smoker   . Smokeless tobacco: Never Used  . Alcohol Use: No   OB History    No data available     Review of Systems  Gastrointestinal: Positive for vomiting, abdominal pain and diarrhea.  All other systems reviewed and are negative.   Allergies  Fish oil and Simvastatin  Home Medications   Prior to Admission medications   Medication Sig Start Date End Date Taking? Authorizing Provider  albuterol (PROVENTIL HFA;VENTOLIN HFA) 108 (  90 Base) MCG/ACT inhaler Inhale 1-2 puffs into the lungs every 6 (six) hours as needed for wheezing or shortness of breath. 04/21/15   Jan Fireman, PA-C  ALPRAZolam Duanne Moron) 0.5 MG tablet Take 0.5 mg by mouth at bedtime as needed for sleep. Reported on 03/06/2015    Historical Provider, MD  aspirin 81 MG tablet Take 81 mg by mouth daily.    Historical Provider, MD  benzonatate (TESSALON) 100 MG capsule Take 1 capsule (100 mg total) by mouth every 8 (eight) hours as needed for cough. 05/01/15    Birdie Sons, MD  bismuth subsalicylate (PEPTO-BISMOL) 262 MG/15ML suspension Take 30 mLs by mouth 3 (three) times daily. Take with Zantac for 3-5 days the Pepto-Bismol may be in the equivalent as the DS tablets 07/04/15   Frederich Cha, MD  budesonide-formoterol [facility]) 160-4.5 MCG/ACT inhaler Inhale 2 puffs into the lungs 2 (two) times daily. 04/30/15 05/14/15  Birdie Sons, MD  Calcium Carbonate-Vitamin D (CALTRATE 600+D PO) Take by mouth daily.     Historical Provider, MD  cefUROXime (CEFTIN) 500 MG tablet Take 1 tablet (500 mg total) by mouth 2 (two) times daily. 07/04/15   Frederich Cha, MD  Cholecalciferol (VITAMIN D) 2000 UNITS tablet Take 2,000 Units by mouth daily.    Historical Provider, MD  clopidogrel (PLAVIX) 75 MG tablet TAKE (1) TABLET BY MOUTH EVERY DAY WITH BREAKFAST 07/23/14   Minna Merritts, MD  cyanocobalamin (,VITAMIN B-12,) 1000 MCG/ML injection INJECT 1 ML INTRAMUSCULARLY MONTHLY. 02/27/15   Birdie Sons, MD  cyclobenzaprine (FLEXERIL) 10 MG tablet Take 0.5-1 tablets (5-10 mg total) by mouth at bedtime as needed for muscle spasms. 12/28/14   Birdie Sons, MD  diphenoxylate-atropine (LOMOTIL) 2.5-0.025 MG tablet Take 1 tablet by mouth 3 (three) times daily as needed for diarrhea or loose stools. 07/04/15   Frederich Cha, MD  ezetimibe (ZETIA) 10 MG tablet Take 1 tablet (10 mg total) by mouth daily. 03/12/15   Birdie Sons, MD  fluticasone (FLONASE) 50 MCG/ACT nasal spray Place 2 sprays into both nostrils daily. 04/21/15   Jan Fireman, PA-C  ibandronate (BONIVA) 150 MG tablet 1 tablet once a month. 08/31/14   Birdie Sons, MD  ibuprofen (ADVIL,MOTRIN) 200 MG tablet Take 200 mg by mouth every 6 (six) hours as needed.    Historical Provider, MD  loratadine (CLARITIN) 10 MG tablet Take 10 mg by mouth daily.    Historical Provider, MD  metoprolol succinate (TOPROL-XL) 50 MG 24 hr tablet TAKE ONE (1) TABLET BY MOUTH ONCE DAILY 11/26/14   Minna Merritts, MD  nitrofurantoin,  macrocrystal-monohydrate, (MACROBID) 100 MG capsule Take 1 capsule (100 mg total) by mouth 2 (two) times daily. Patient not taking: Reported on 06/26/2015 06/23/15   Frederich Cha, MD  pantoprazole (PROTONIX) 40 MG tablet Take 1 tablet (40 mg total) by mouth daily. 03/12/15   Birdie Sons, MD  phenazopyridine (PYRIDIUM) 200 MG tablet Take 1 tablet (200 mg total) by mouth 3 (three) times daily as needed for pain. Patient not taking: Reported on 06/26/2015 06/23/15   Frederich Cha, MD  ranitidine (ZANTAC) 150 MG capsule Take 1 capsule (150 mg total) by mouth 3 (three) times daily. With Pepto-Bismol for 3-5 days 07/04/15   Frederich Cha, MD  sulfamethoxazole-trimethoprim (BACTRIM DS,SEPTRA DS) 800-160 MG tablet Take 1 tablet by mouth 2 (two) times daily. 07/01/15 07/08/15  Birdie Sons, MD  vitamin B-12 (CYANOCOBALAMIN) 1000 MCG tablet Take 1,000 mcg by  mouth daily.    Historical Provider, MD  vitamin C (ASCORBIC ACID) 500 MG tablet Take 500 mg by mouth daily.    Historical Provider, MD  vitamin E 1000 UNIT capsule Take 1,000 Units by mouth daily.    Historical Provider, MD   Meds Ordered and Administered this Visit   Medications  promethazine (PHENERGAN) injection 25 mg (25 mg Intramuscular Given 07/04/15 1940)  cefTRIAXone (ROCEPHIN) injection 1 g (1 g Intramuscular Given 07/04/15 2105)    BP 159/84 mmHg  Pulse 90  Temp(Src) 98.9 F (37.2 C) (Oral)  Resp 20  Ht 5' 2"  (1.575 m)  Wt 158 lb (71.668 kg)  BMI 28.89 kg/m2  SpO2 100% No data found.   Physical Exam  Constitutional: She appears well-developed and well-nourished.  HENT:  Head: Normocephalic and atraumatic.  Right Ear: External ear normal.  Left Ear: External ear normal.  Mouth/Throat: Oropharynx is clear and moist.  Eyes: Conjunctivae are normal. Pupils are equal, round, and reactive to light.  Neck: Neck supple. No tracheal deviation present.  Cardiovascular: Normal rate, regular rhythm and normal heart sounds.   Pulmonary/Chest:  Effort normal and breath sounds normal.  Abdominal: Soft. Bowel sounds are normal. She exhibits no distension. There is tenderness.  Musculoskeletal: Normal range of motion. She exhibits no tenderness.  Neurological: She is alert.  Skin: Skin is warm and dry.  Psychiatric: She has a normal mood and affect.  Vitals reviewed.   ED Course  Procedures (including critical care time)  Labs Review Labs Reviewed  CBC WITH DIFFERENTIAL/PLATELET - Abnormal; Notable for the following:    WBC 13.7 (*)    RBC 5.55 (*)    HCT 49.3 (*)    MCHC 31.8 (*)    Neutro Abs 12.3 (*)    Lymphs Abs 0.8 (*)    All other components within normal limits  URINALYSIS COMPLETEWITH MICROSCOPIC ([facility] ONLY) - Abnormal; Notable for the following:    APPearance HAZY (*)    Hgb urine dipstick TRACE (*)    Protein, ur 30 (*)    Leukocytes, UA 1+ (*)    Bacteria, UA FEW (*)    Squamous Epithelial / LPF 0-5 (*)    All other components within normal limits  RAPID INFLUENZA A&B ANTIGENS ([facility] ONLY)  URINE CULTURE  AMYLASE  LIPASE, BLOOD    Imaging Review No results found.   Visual Acuity Review  Right Eye Distance:   Left Eye Distance:   Bilateral Distance:    Right Eye Near:   Left Eye Near:    Bilateral Near:     Results for orders placed or performed during the hospital encounter of 07/04/15  Rapid Influenza A&B Antigens ([facility] only)  Result Value Ref Range   Influenza A ([facility]) NEGATIVE NEGATIVE   Influenza B ([facility]) NEGATIVE NEGATIVE  CBC with Differential  Result Value Ref Range   WBC 13.7 (H) 3.6 - 11.0 K/uL   RBC 5.55 (H) 3.80 - 5.20 MIL/uL   Hemoglobin 15.7 12.0 - 16.0 g/dL   HCT 49.3 (H) 35.0 - 47.0 %   MCV 88.8 80.0 - 100.0 fL   MCH 28.2 26.0 - 34.0 pg   MCHC 31.8 (L) 32.0 - 36.0 g/dL   RDW 13.0 11.5 - 14.5 %   Platelets 277 150 - 440 K/uL   Neutrophils Relative % 90% %   Neutro Abs 12.3 (H) 1.4 - 6.5 K/uL   Lymphocytes Relative 6% %   Lymphs Abs 0.8 (L) 1.0 -  3.6 K/uL    Monocytes Relative 3% %   Monocytes Absolute 0.4 0.2 - 0.9 K/uL   Eosinophils Relative 1% %   Eosinophils Absolute 0.1 0 - 0.7 K/uL   Basophils Relative 0% %   Basophils Absolute 0.0 0 - 0.1 K/uL  Amylase  Result Value Ref Range   Amylase 32 28 - 100 U/L  Lipase, blood  Result Value Ref Range   Lipase 36 11 - 51 U/L  Urinalysis complete, with microscopic  Result Value Ref Range   Color, Urine YELLOW YELLOW   APPearance HAZY (A) CLEAR   Glucose, UA NEGATIVE NEGATIVE mg/dL   Bilirubin Urine NEGATIVE NEGATIVE   Ketones, ur NEGATIVE NEGATIVE mg/dL   Specific Gravity, Urine 1.020 1.005 - 1.030   Hgb urine dipstick TRACE (A) NEGATIVE   pH 5.5 5.0 - 8.0   Protein, ur 30 (A) NEGATIVE mg/dL   Nitrite NEGATIVE NEGATIVE   Leukocytes, UA 1+ (A) NEGATIVE   RBC / HPF 0-5 0 - 5 RBC/hpf   WBC, UA 6-30 0 - 5 WBC/hpf   Bacteria, UA FEW (A) NONE SEEN   Squamous Epithelial / LPF 0-5 (A) NONE SEEN   Hyaline Casts, UA PRESENT    WBC Casts, UA PRESENT      MDM   1. Gastroenteritis, acute   2. Rotavirus enteritis   3. UTI (lower urinary tract infection)     At this point I don't want to try to treat patient with a antibiotic for a rotavirus infection. She does or did have a history of a UTI that may or may not be treated or had been treated adequately. In lieu of that we'll try to get a urine if the urine is abnormal may consider giving a shot of Rocephin. Since she was unable keep the finger down Phenergan works well for her her daughter is here who can give her a ride home will give her IM Phenergan while she is here. Will check a white count CMP amylase lipase to make sure we were not missing anything else since she does have a history of GI intolerance before this rotavirus hit her today. I have explained to her and her daughter that the being seen after 7 and generally we do not start IV fluids at this time of day but if she remains sick without improvement in nausea vomiting will need to get  to the emergency room to be seen and evaluated and monitored.    White count was 13,000 urine was positive for UTI. Will give gram Rocephin IM for UTI Ceftin 500 twice a day for that 4 week. Culture was also ordered. For the rotavirus placed on Pepto-Bismol and Zantac combined for 3 days for the next 3-5 days she has a Phenergan at home we'll give a new prescription will use Lomotil for the diarrhea warned her that the motor was not to stop the stools completely only to prevent her from basically living on the prone. Follow-up Dr. Caryn Section next week if not better. And for follow-up of UTI. Of course not take other antibiotics given for the UTI earlier. The Macrodantin she was unable to keep down Cipro she is unable to keep down she was afraid to start Septra.   Note: This dictation was prepared with Dragon dictation along with smaller phrase technology. Any transcriptional errors that result from this process are unintentional.  Frederich Cha, MD 07/04/15 2119

## 2015-07-04 NOTE — Discharge Instructions (Signed)
Urinary Tract Infection A urinary tract infection (UTI) can occur any place along the urinary tract. The tract includes the kidneys, ureters, bladder, and urethra. A type of germ called bacteria often causes a UTI. UTIs are often helped with antibiotic medicine.  HOME CARE   If given, take antibiotics as told by your doctor. Finish them even if you start to feel better.  Drink enough fluids to keep your pee (urine) clear or pale yellow.  Avoid tea, drinks with caffeine, and bubbly (carbonated) drinks.  Pee often. Avoid holding your pee in for a long time.  Pee before and after having sex (intercourse).  Wipe from front to back after you poop (bowel movement) if you are a woman. Use each tissue only once. GET HELP RIGHT AWAY IF:   You have back pain.  You have lower belly (abdominal) pain.  You have chills.  You feel sick to your stomach (nauseous).  You throw up (vomit).  Your burning or discomfort with peeing does not go away.  You have a fever.  Your symptoms are not better in 3 days. MAKE SURE YOU:   Understand these instructions.  Will watch your condition.  Will get help right away if you are not doing well or get worse.   This information is not intended to replace advice given to you by your health care provider. Make sure you discuss any questions you have with your health care provider.   Document Released: 08/05/2007 Document Revised: 03/09/2014 Document Reviewed: 09/17/2011 Elsevier Interactive Patient Education 2016 [employer].  Rotavirus Infection Rotaviruses are a group of viruses that cause acute stomach and bowel upset (gastroenteritis) in all ages. Rotavirus infection may also be called infantile diarrhea, winter diarrhea, acute nonbacterial infectious gastroenteritis, and acute viral gastroenteritis. It occurs especially in young children. Children 6 months to 53 years of age, premature infants, the elderly, and the immunocompromised are more likely  to have severe symptoms.  CAUSES  Rotaviruses are transmitted by the fecal-oral route. This means the virus is spread by eating or drinking food or water that is contaminated with infected stool. The virus is most commonly spread from person to person when someone's hands are contaminated with infected stool. For example, infected food handlers may contaminate foods. This can occur with foods that require handling and no further cooking, such as salads, fruits, and hors d'oeuvres. Rotaviruses are quite stable. They can be hard to control and eliminate in water supplies. Rotaviruses are a common cause of infection and diarrhea in child-care settings. SYMPTOMS  Some children have no symptoms. The period after infection but before symptoms begin (incubation period) ranges from 1 to 3 days. Symptoms usually begin with vomiting. Diarrhea follows for 4 to 8 days. Other symptoms may include:  Low-grade fever.  Temporary dairy (lactose) intolerance.  Cough.  Runny nose. DIAGNOSIS  The disease is diagnosed by identifying the virus in the stool. A person with rotavirus diarrhea often has large numbers of viruses in his or her stool. TREATMENT  There is no cure for rotavirus infection. Most people develop an immune response that eventually gets rid of the virus. While this natural response develops, the virus can make you very ill. The majority of people affected are young infants, so the disease can be dangerous. The most common symptom is diarrhea. Diarrhea alone can cause severe dehydration. It can also cause an electrolyte imbalance. Treatments are aimed at rehydration. Rehydration treatment can prevent the severe effects of dehydration. Antidiarrheal medicines are not  recommended. Such medicines may prolong the infection, since they prevent you from passing the viruses out of your body. Severe diarrhea without fluid and electrolyte replacement may be life threatening. HOME CARE INSTRUCTIONS Ask your  health care provider for specific rehydration instructions. SEEK IMMEDIATE MEDICAL CARE IF:   There is decreased urination.  You have a dry mouth, tongue, or lips.  You notice decreased tears or sunken eyes.  You have dry skin.  Your breathing is fast.  Your fingertip takes more than 2 seconds to turn pink again after a gentle squeeze.  There is blood in your vomit or stool.  Your abdomen is enlarged (distended) or very tender.  There is persistent vomiting. Most of this information is courtesy of the Center for Disease Control and Prevention of Food Illness Fact Sheet.   This information is not intended to replace advice given to you by your health care provider. Make sure you discuss any questions you have with your health care provider.   Document Released: 02/16/2005 Document Revised: 03/09/2014 Document Reviewed: 05/15/2010 Elsevier Interactive Patient Education [employer].

## 2015-07-04 NOTE — ED Notes (Addendum)
Pt presents with diarrhea and vomiting started today around lunch time. Son lives with her and was dx with GI Virus and the FLU two days ago. Also c/o abd pain/cramping.

## 2015-07-06 LAB — URINE CULTURE
Culture: >=100000 — AB
Special Requests: NORMAL

## 2015-07-08 ENCOUNTER — Other Ambulatory Visit: Payer: Self-pay

## 2015-07-08 ENCOUNTER — Encounter: Payer: Self-pay | Admitting: Emergency Medicine

## 2015-07-08 ENCOUNTER — Emergency Department: Payer: Medicare Other

## 2015-07-08 ENCOUNTER — Encounter: Payer: Self-pay | Admitting: Family Medicine

## 2015-07-08 ENCOUNTER — Ambulatory Visit (INDEPENDENT_AMBULATORY_CARE_PROVIDER_SITE_OTHER): Payer: Medicare Other | Admitting: Family Medicine

## 2015-07-08 ENCOUNTER — Emergency Department
Admission: EM | Admit: 2015-07-08 | Discharge: 2015-07-08 | Disposition: A | Discharge status: Home / Self Care | Payer: Medicare Other | Attending: Emergency Medicine | Admitting: Emergency Medicine

## 2015-07-08 VITALS — BP 140/102 | HR 71 | Temp 98.0°F | Resp 14 | Wt 150.2 lb

## 2015-07-08 DIAGNOSIS — I10 Essential (primary) hypertension: Secondary | ICD-10-CM | POA: Diagnosis not present

## 2015-07-08 DIAGNOSIS — Z7982 Long term (current) use of aspirin: Secondary | ICD-10-CM | POA: Diagnosis not present

## 2015-07-08 DIAGNOSIS — N390 Urinary tract infection, site not specified: Secondary | ICD-10-CM | POA: Diagnosis not present

## 2015-07-08 DIAGNOSIS — N179 Acute kidney failure, unspecified: Secondary | ICD-10-CM | POA: Diagnosis not present

## 2015-07-08 DIAGNOSIS — K529 Noninfective gastroenteritis and colitis, unspecified: Secondary | ICD-10-CM | POA: Insufficient documentation

## 2015-07-08 DIAGNOSIS — K7689 Other specified diseases of liver: Secondary | ICD-10-CM | POA: Diagnosis not present

## 2015-07-08 DIAGNOSIS — Z79899 Other long term (current) drug therapy: Secondary | ICD-10-CM | POA: Diagnosis not present

## 2015-07-08 DIAGNOSIS — E861 Hypovolemia: Secondary | ICD-10-CM

## 2015-07-08 DIAGNOSIS — R1110 Vomiting, unspecified: Secondary | ICD-10-CM | POA: Diagnosis present

## 2015-07-08 DIAGNOSIS — Z8701 Personal history of pneumonia (recurrent): Secondary | ICD-10-CM | POA: Diagnosis not present

## 2015-07-08 DIAGNOSIS — R112 Nausea with vomiting, unspecified: Secondary | ICD-10-CM | POA: Diagnosis not present

## 2015-07-08 LAB — URINALYSIS COMPLETE WITH MICROSCOPIC (ARMC ONLY)
Bacteria, UA: NONE SEEN
Bilirubin Urine: NEGATIVE
Glucose, UA: NEGATIVE mg/dL
Hgb urine dipstick: NEGATIVE
KETONES UR: NEGATIVE mg/dL
Nitrite: NEGATIVE
PH: 5 (ref 5.0–8.0)
PROTEIN: 30 mg/dL — AB
Specific Gravity, Urine: 1.01 (ref 1.005–1.030)

## 2015-07-08 LAB — MAGNESIUM: MAGNESIUM: 1.7 mg/dL (ref 1.7–2.4)

## 2015-07-08 LAB — CBC
HCT: 43 % (ref 35.0–47.0)
Hemoglobin: 14.1 g/dL (ref 12.0–16.0)
MCH: 28.4 pg (ref 26.0–34.0)
MCHC: 32.8 g/dL (ref 32.0–36.0)
MCV: 86.4 fL (ref 80.0–100.0)
PLATELETS: 227 10*3/uL (ref 150–440)
RBC: 4.97 MIL/uL (ref 3.80–5.20)
RDW: 13.1 % (ref 11.5–14.5)
WBC: 5.6 10*3/uL (ref 3.6–11.0)

## 2015-07-08 LAB — GASTROINTESTINAL PANEL BY PCR, STOOL (REPLACES STOOL CULTURE)
ASTROVIRUS: NOT DETECTED
Adenovirus F40/41: NOT DETECTED
CAMPYLOBACTER SPECIES: NOT DETECTED
Cryptosporidium: NOT DETECTED
Cyclospora cayetanensis: NOT DETECTED
E. COLI O157: NOT DETECTED
ENTAMOEBA HISTOLYTICA: NOT DETECTED
ENTEROTOXIGENIC E COLI (ETEC): NOT DETECTED
Enteroaggregative E coli (EAEC): NOT DETECTED
Enteropathogenic E coli (EPEC): NOT DETECTED
Giardia lamblia: NOT DETECTED
NOROVIRUS GI/GII: NOT DETECTED
PLESIMONAS SHIGELLOIDES: NOT DETECTED
ROTAVIRUS A: NOT DETECTED
SALMONELLA SPECIES: NOT DETECTED
SAPOVIRUS (I, II, IV, AND V): NOT DETECTED
SHIGA LIKE TOXIN PRODUCING E COLI (STEC): NOT DETECTED
SHIGELLA/ENTEROINVASIVE E COLI (EIEC): NOT DETECTED
Vibrio cholerae: NOT DETECTED
Vibrio species: NOT DETECTED
Yersinia enterocolitica: NOT DETECTED

## 2015-07-08 LAB — COMPREHENSIVE METABOLIC PANEL
ALK PHOS: 59 U/L (ref 38–126)
ALT: 34 U/L (ref 14–54)
AST: 52 U/L — AB (ref 15–41)
Albumin: 4.2 g/dL (ref 3.5–5.0)
Anion gap: 10 (ref 5–15)
BUN: 18 mg/dL (ref 6–20)
CALCIUM: 9.4 mg/dL (ref 8.9–10.3)
CHLORIDE: 109 mmol/L (ref 101–111)
CO2: 21 mmol/L — AB (ref 22–32)
CREATININE: 1.44 mg/dL — AB (ref 0.44–1.00)
GFR calc Af Amer: 41 mL/min — ABNORMAL LOW (ref >60)
GFR calc non Af Amer: 36 mL/min — ABNORMAL LOW (ref >60)
GLUCOSE: 100 mg/dL — AB (ref 65–99)
Potassium: 3.6 mmol/L (ref 3.5–5.1)
SODIUM: 140 mmol/L (ref 135–145)
Total Bilirubin: 0.6 mg/dL (ref 0.3–1.2)
Total Protein: 7.7 g/dL (ref 6.5–8.1)

## 2015-07-08 LAB — C DIFFICILE QUICK SCREEN W PCR REFLEX
C DIFFICILE (CDIFF) INTERP: NEGATIVE
C DIFFICILE (CDIFF) TOXIN: NEGATIVE
C DIFFICLE (CDIFF) ANTIGEN: NEGATIVE

## 2015-07-08 LAB — LIPASE, BLOOD: LIPASE: 46 U/L (ref 11–51)

## 2015-07-08 MED ORDER — SODIUM CHLORIDE 0.9 % IV BOLUS (SEPSIS)
1000.0000 mL | Freq: Once | INTRAVENOUS | Status: AC
  Administered 2015-07-08: 1000 mL via INTRAVENOUS

## 2015-07-08 MED ORDER — FOSFOMYCIN TROMETHAMINE 3 G PO PACK
3.0000 g | PACK | ORAL | Status: AC
  Administered 2015-07-08: 3 g via ORAL
  Filled 2015-07-08: qty 3

## 2015-07-08 MED ORDER — ONDANSETRON 4 MG PO TBDP: 4.0000 mg | ORAL_TABLET | Freq: Four times a day (QID) | ORAL | Status: DC | PRN

## 2015-07-08 MED ORDER — ONDANSETRON HCL 4 MG/2ML IJ SOLN
4.0000 mg | Freq: Once | INTRAMUSCULAR | Status: AC
  Administered 2015-07-08: 4 mg via INTRAVENOUS
  Filled 2015-07-08: qty 2

## 2015-07-08 MED ORDER — FOSFOMYCIN TROMETHAMINE 3 G PO PACK: 3.0000 g | PACK | Freq: Once | ORAL | Status: DC

## 2015-07-08 MED ORDER — DIATRIZOATE MEGLUMINE & SODIUM 66-10 % PO SOLN
15.0000 mL | ORAL | Status: AC
  Administered 2015-07-08: 30 mL via ORAL

## 2015-07-08 NOTE — ED Notes (Signed)
Pt has been having vomiting and diarrhea since April 25th. Has lost 8lbs since then, Dr called here per pts daughter and sending pt for IV fluids.

## 2015-07-08 NOTE — ED Notes (Signed)
Pt c/o n/v/d for last month but has gotten worse since 5/4.  Pt sts that he has been unable to keep down any food or water.  Pt denies pain.  Pt sts that she has been seen by PCP and [facility], but nothing rx'd has worked.

## 2015-07-08 NOTE — ED Provider Notes (Signed)
[facility] Emergency Department Provider Note  ____________________________________________  Time seen: Approximately 2:57 PM  I have reviewed the triage vital signs and the nursing notes.   HISTORY  Chief Complaint Emesis and Diarrhea    HPI TURNER KUNZMAN is a 70 y.o. female presents for evaluation of vomiting and diarrhea.  Patient reports that she's been having vomiting and diarrhea for well over 3 weeks. She continues to have nausea, eating poorly, and having loose stools 1-2 daily for about the last 3 weeks. She been diagnosed and treated for urinary tract infection 3 times, and reports her symptoms of UTI symptoms beginning better.  She had a follow-up with her doctor today who sent her to the ER for further evaluation and fluids for "dehydration".  Currently reports nausea, no pain. No fevers or chills. No numbness tingling or weakness.   Past Medical History  Diagnosis Date  . SVT (supraventricular tachycardia) (HCC)     history of  . Pneumonia   . TIA (transient ischemic attack) 2010    Negative carotids.   . Diverticulosis   . Hypercalcemia     Secondar to calcium supplements. Normal PTH    Patient Active Problem List   Diagnosis Date Noted  . Hay fever 05/29/2015  . NASH (nonalcoholic steatohepatitis) 05/29/2015  . H/O transient cerebral ischemia 05/29/2015  . Personal history of other diseases of the musculoskeletal system and connective tissue 05/29/2015  . Diverticulosis 08/24/2014  . Fibrocystic breast disease 08/24/2014  . Acquired sideroblastic anemia 08/23/2014  . Allergic rhinitis 08/23/2014  . DDD (degenerative disc disease), thoracolumbar 08/23/2014  . Abnormal liver enzymes 08/23/2014  . Hearing loss 08/23/2014  . Insomnia 08/23/2014  . Lumbago 08/23/2014  . Osteopenia 08/23/2014  . Overweight (BMI 25.0-29.9) 08/23/2014  . Pre-diabetes 08/23/2014  . Obstructive sleep apnea 08/23/2014  . Varicose vein  08/23/2014  . B12 deficiency 08/23/2014  . Vitamin D deficiency 08/23/2014  . Malaise and fatigue 09/15/2013  . Carotid stenosis 09/15/2013  . Hypertension, essential, benign 05/26/2011  . SVT (supraventricular tachycardia) ([place]) 05/26/2011  . Menopause 01/02/2011  . Nonalcoholic steatohepatitis (NASH) 05/20/2009  . Hypercholesterolemia without hypertriglyceridemia 05/20/2009  . Pure hypercholesterolemia 05/20/2009  . Diaphragmatic hernia 08/10/2008  . Cardiac conduction disorder 08/10/2008  . Benign essential HTN 08/10/2008  . GERD (gastroesophageal reflux disease) 08/10/2008    Past Surgical History  Procedure Laterality Date  . Appendectomy  1954  . Cholecystectomy  1981  . Cardiac catheterization  [id number]    Current Outpatient Rx  Name  Route  Sig  Dispense  Refill  . albuterol (PROVENTIL HFA;VENTOLIN HFA) 108 (90 Base) MCG/ACT inhaler   Inhalation   Inhale 1-2 puffs into the lungs every 6 (six) hours as needed for wheezing or shortness of breath.   1 Inhaler   0   . ALPRAZolam (XANAX) 0.5 MG tablet   Oral   Take 0.5 mg by mouth at bedtime as needed for sleep. Reported on 03/06/2015         . aspirin 81 MG tablet   Oral   Take 81 mg by mouth daily.         Marland Kitchen bismuth subsalicylate (PEPTO-BISMOL) 262 MG/15ML suspension   Oral   Take 30 mLs by mouth 3 (three) times daily. Take with Zantac for 3-5 days the Pepto-Bismol may be in the equivalent as the DS tablets   473 mL   0   . EXPIRED: budesonide-formoterol (SYMBICORT) 160-4.5 MCG/ACT inhaler   Inhalation  Inhale 2 puffs into the lungs 2 (two) times daily.   1 Inhaler   0   . Calcium Carbonate-Vitamin D (CALTRATE 600+D PO)   Oral   Take by mouth daily.          . Cholecalciferol (VITAMIN D) 2000 UNITS tablet   Oral   Take 2,000 Units by mouth daily.         . clopidogrel (PLAVIX) 75 MG tablet      TAKE (1) TABLET BY MOUTH EVERY DAY WITH BREAKFAST   90 tablet   3   . cyanocobalamin (,VITAMIN  B-12,) 1000 MCG/ML injection      INJECT 1 ML INTRAMUSCULARLY MONTHLY.   1 mL   0     PATIENT NEEDS TO SCHEDULE OFFICE VISIT FOR FOLLOW  ...   . cyclobenzaprine (FLEXERIL) 10 MG tablet   Oral   Take 0.5-1 tablets (5-10 mg total) by mouth at bedtime as needed for muscle spasms.   30 tablet   1   . diphenoxylate-atropine (LOMOTIL) 2.5-0.025 MG tablet   Oral   Take 1 tablet by mouth 3 (three) times daily as needed for diarrhea or loose stools.   20 tablet   0   . ezetimibe (ZETIA) 10 MG tablet   Oral   Take 1 tablet (10 mg total) by mouth daily.   30 tablet   3   . fluticasone (FLONASE) 50 MCG/ACT nasal spray   Each Nare   Place 2 sprays into both nostrils daily.   16 g   2   . fosfomycin (MONUROL) 3 g PACK   Oral   Take 3 g by mouth once. Please mix in 8 oz of water, take by mouth once   3 g   0   . ibandronate (BONIVA) 150 MG tablet      1 tablet once a month.   1 tablet   12     Take in the morning with a full glass of water, on ...   . ibuprofen (ADVIL,MOTRIN) 200 MG tablet   Oral   Take 200 mg by mouth every 6 (six) hours as needed.         . loratadine (CLARITIN) 10 MG tablet   Oral   Take 10 mg by mouth daily.         . metoprolol succinate (TOPROL-XL) 50 MG 24 hr tablet      TAKE ONE (1) TABLET BY MOUTH ONCE DAILY   30 tablet   3   . ondansetron (ZOFRAN ODT) 4 MG disintegrating tablet   Oral   Take 1 tablet (4 mg total) by mouth every 6 (six) hours as needed for nausea or vomiting.   20 tablet   0   . pantoprazole (PROTONIX) 40 MG tablet   Oral   Take 1 tablet (40 mg total) by mouth daily.   90 tablet   3   . phenazopyridine (PYRIDIUM) 200 MG tablet   Oral   Take 1 tablet (200 mg total) by mouth 3 (three) times daily as needed for pain.   15 tablet   0   . ranitidine (ZANTAC) 150 MG capsule   Oral   Take 1 capsule (150 mg total) by mouth 3 (three) times daily. With Pepto-Bismol for 3-5 days   15 capsule   0   . vitamin B-12  (CYANOCOBALAMIN) 1000 MCG tablet   Oral   Take 1,000 mcg by mouth daily.         Marland Kitchen  vitamin C (ASCORBIC ACID) 500 MG tablet   Oral   Take 500 mg by mouth daily.         . vitamin E 1000 UNIT capsule   Oral   Take 1,000 Units by mouth daily.           Allergies Fish oil and Simvastatin  Family History  Problem Relation Age of Onset  . Heart disease Father   . Dementia Father   . Heart disease Mother   . Stroke    . Heart disease Brother     Social History Social History  Substance Use Topics  . Smoking status: Never Smoker   . Smokeless tobacco: Never Used  . Alcohol Use: No    Review of Systems Constitutional: No fever/chills Eyes: No visual changes. ENT: No sore throat. Cardiovascular: Denies chest pain. Respiratory: Denies shortness of breath. Gastrointestinal: No constipation. Genitourinary: Negative for dysuria. Musculoskeletal: Negative for back pain. Skin: Negative for rash. Neurological: Negative for headaches, focal weakness or numbness.  No travel. No blood in stool.  10-point ROS otherwise negative.  ____________________________________________   PHYSICAL EXAM:  VITAL SIGNS: ED Triage Vitals  Enc Vitals Group     BP 07/08/15 1230 159/92 mmHg     Pulse Rate 07/08/15 1230 79     Resp 07/08/15 1230 14     Temp --      Temp src --      SpO2 07/08/15 1230 99 %     Weight 07/08/15 1130 150 lb (68.04 kg)     Height 07/08/15 1130 5' 2"  (1.575 m)     Head Cir --      Peak Flow --      Pain Score 07/08/15 1130 4     Pain Loc --      Pain Edu? --      Excl. in [place]? --    Constitutional: Alert and oriented. Well appearing and in no acute distress. Eyes: Conjunctivae are normal. PERRL. EOMI. Head: Atraumatic. Nose: No congestion/rhinnorhea. Mouth/Throat: Mucous membranes areDry.  Oropharynx non-erythematous. Neck: No stridor.   Cardiovascular: Normal rate, regular rhythm. Grossly normal heart sounds.  Good peripheral  circulation. Respiratory: Normal respiratory effort.  No retractions. Lungs CTAB. Gastrointestinal: Soft and nontender. No distention. No abdominal bruits. No CVA tenderness. Musculoskeletal: No lower extremity tenderness nor edema.  No joint effusions. Neurologic:  Normal speech and language. No gross focal neurologic deficits are appreciated. No gait instability. Skin:  Skin is warm, dry and intact. No rash noted. Psychiatric: Mood and affect are normal. Speech and behavior are normal.  ____________________________________________   LABS (all labs ordered are listed, but only abnormal results are displayed)  Labs Reviewed  COMPREHENSIVE METABOLIC PANEL - Abnormal; Notable for the following:    CO2 21 (*)    Glucose, Bld 100 (*)    Creatinine, Ser 1.44 (*)    AST 52 (*)    GFR calc non Af Amer 36 (*)    GFR calc Af Amer 41 (*)    All other components within normal limits  URINALYSIS COMPLETEWITH MICROSCOPIC ([facility] ONLY) - Abnormal; Notable for the following:    Color, Urine YELLOW (*)    APPearance CLEAR (*)    Protein, ur 30 (*)    Leukocytes, UA 3+ (*)    Squamous Epithelial / LPF 0-5 (*)    All other components within normal limits  GASTROINTESTINAL PANEL BY PCR, STOOL (REPLACES STOOL CULTURE)  C DIFFICILE QUICK SCREEN W PCR  REFLEX  URINE CULTURE  LIPASE, BLOOD  CBC  MAGNESIUM   ____________________________________________  EKG   ____________________________________________  RADIOLOGY   CT Abdomen Pelvis Wo Contrast (Final result) Result time: 07/08/15 17:23:32   Final result by Rad Results In Interface (07/08/15 17:23:32)   Narrative:   CLINICAL DATA: Nausea vomiting and diarrhea.  EXAM: CT ABDOMEN AND PELVIS WITHOUT CONTRAST  TECHNIQUE: Multidetector CT imaging of the abdomen and pelvis was performed following the standard protocol without IV contrast.  COMPARISON: None.  FINDINGS: Lower chest: Lung bases are clear.  Hepatobiliary: The liver  has a nodular contour suggestive of cirrhosis. No focal liver abnormality noted. Previous cholecystectomy. No biliary dilatation.  Pancreas: No inflammation or mass identified.  Spleen: The spleen appears normal.  Adrenals/Urinary Tract: Normal adrenal glands. Normal appearance of the kidneys. The urinary bladder appears normal.  Stomach/Bowel: The stomach is within normal limits. The small bowel loops have a normal course and caliber. No obstruction. Normal appearance of the colon.  Vascular/Lymphatic: Calcified atherosclerotic disease involves the abdominal aorta. No aneurysm. No enlarged retroperitoneal or mesenteric adenopathy. No enlarged pelvic or inguinal lymph nodes.  Reproductive: The uterus and adnexal structures are unremarkable  Other: There is no ascites or focal fluid collections within the abdomen or pelvis.  Musculoskeletal: No aggressive lytic or sclerotic bone lesions identified. Facet degenerative changes noted within the lumbar spine.  IMPRESSION: 1. No acute findings identified. 2. Morphologic features of the liver suggestive of cirrhosis 3. Aortic atherosclerosis.   Electronically Signed By: Kerby Moors M.D. On: 07/08/2015 17:23       ____________________________________________   PROCEDURES  Procedure(s) performed: None  Critical Care performed: No  ____________________________________________   INITIAL IMPRESSION / ASSESSMENT AND PLAN / ED COURSE  Pertinent labs & imaging results that were available during my care of the patient were reviewed by me and considered in my medical decision making (see chart for details).  Patient presents for nausea vomiting and loose stool ongoing for several weeks. She also reports she been treated 3 times for possible urinary tract infection of the symptoms seem to be improving. She is very reassuring exam but does appear mildly dehydrated. She is hemodynamically stable and afebrile.  Her lab  results today seem to indicate acute renal insufficiency. I discussed with the patient, and she reports that she feels dehydrated and her doctors following her very closely. In addition her CT scan was reassuring, and was able to get stool studies today which are negative for obvious infection. She is not having elevated white count, no peritonitis. Because of the ongoing nature of her symptoms we did discuss and perform CT scan to exclude other causes including colitis although there are limitations due to a noncontrast image.  After hydration, Zofran, the patient reports she feels much better. She may question of a still have a urinary tract infection and this was sent for culture and appears to be a clean sample today as opposed to previous was growing multiple organisms. I will place her on fosfomycin in case of a possible resistant infection, give her a second dose to take in 72 hours, and did offer admission for dehydration and renal insufficiency but the patient and her daughter seem very reasonable and were able to set up a follow-up appointment tomorrow at 10 AM for reevaluation. I think this is reasonable, she is clinically stable, and with close follow-up she appears appropriate for ongoing outpatient evaluation.  Return precautions and treatment recommendations and follow-up discussed with the patient  who is agreeable with the plan.  No cardiac, pulmonary or neurologic symptoms. Reassuring CT and labs today.   ____________________________________________   FINAL CLINICAL IMPRESSION(S) / ED DIAGNOSES  Final diagnoses:  Gastroenteritis  Acute kidney injury (HCC)      Delman Kitten, MD 07/08/15 1900

## 2015-07-08 NOTE — Discharge Instructions (Signed)
Units stool and urine culture done today, these should result in about the next 2-3 days.  Please take your second dose of antibiotic this Thursday.  Follow-up with your doctor tomorrow at 10 AM. I recommend that you have your renal function tests including "creatinine" repeated.  Colitis Colitis is inflammation of the colon. Colitis may last a short time (acute) or it may last a long time (chronic). CAUSES This condition may be caused by:  Viruses.  Bacteria.  Reactions to medicine.  Certain autoimmune diseases, such as Crohn disease or ulcerative colitis. SYMPTOMS Symptoms of this condition include:  Diarrhea.  Passing bloody or tarry stool.  Pain.  Fever.  Vomiting.  Tiredness (fatigue).  Weight loss.  Bloating.  Sudden increase in abdominal pain.  Having fewer bowel movements than usual. DIAGNOSIS This condition is diagnosed with a stool test or a blood test. You may also have other tests, including X-rays, a CT scan, or a colonoscopy. TREATMENT Treatment may include:  Resting the bowel. This involves not eating or drinking for a period of time.  Fluids that are given through an IV tube.  Medicine for pain and diarrhea.  Antibiotic medicines.  Cortisone medicines.  Surgery. HOME CARE INSTRUCTIONS Eating and Drinking  Follow instructions from your health care provider about eating or drinking restrictions.  Drink enough fluid to keep your urine clear or pale yellow.  Work with a dietitian to determine which foods cause your condition to flare up.  Avoid foods that cause flare-ups.  Eat a well-balanced diet. Medicines  Take over-the-counter and prescription medicines only as told by your health care provider.  If you were prescribed an antibiotic medicine, take it as told by your health care provider. Do not stop taking the antibiotic even if you start to feel better. General Instructions  Keep all follow-up visits as told by your health  care provider. This is important. SEEK MEDICAL CARE IF:  Your symptoms do not go away.  You develop new symptoms. SEEK IMMEDIATE MEDICAL CARE IF:  You have a fever that does not go away with treatment.  You develop chills.  You have extreme weakness, fainting, or dehydration.  You have repeated vomiting.  You develop severe pain in your abdomen.  You pass bloody or tarry stool.   This information is not intended to replace advice given to you by your health care provider. Make sure you discuss any questions you have with your health care provider.   Document Released: 03/26/2004 Document Revised: 11/07/2014 Document Reviewed: 06/11/2014 Elsevier Interactive Patient Education [employer].

## 2015-07-08 NOTE — ED Notes (Signed)
Pt able to walk w/o difficilty.  Pt able to keep down fluids.  Sts that she feels better.

## 2015-07-08 NOTE — Progress Notes (Signed)
Patient ID: Jade Gardner, female   DOB: [birth year], 70 y.o.   MRN: [id number]   Patient: Jade Gardner Female    DOB: 1945-12-05   70 y.o.   MRN: [id number] Visit Date: 07/08/2015  Today's Provider: Vernie Murders, PA   Chief Complaint  Patient presents with  . Diarrhea  . Urinary Tract Infection  . Emesis   Subjective:    HPI Patient was seen at Urgent Care on 07/04/2015. Patient was diagnosed with   Gastroenteritis, UTI,  And Roto Virus.     Patient was prescribed antibiotics. Patient reports symptoms are the same. Current symptoms include diarrhea with blood, vomiting, and abdominal pain. States her first visit to the Urgent Care was on 06-23-15 for UTI symptoms and was treated with Macrobid and Pyridium. Developed nausea and vomiting with generalized abdominal pain. Was check by her PCP (Dr. Caryn Section) on 06-26-15 with urine culture identifying E.Coli bacteria that was sensitive to all antibiotics except ampicillin and Augmentin. She was given Cipro and stopped the Pyridium because she felt that was the reason for her N&V. Over the next 4 days she tried to take the Cipro but could no keep any of this down. Went back to the Urgent care late 07-04-15 because the N,V&D recurred and relayed a history of a family member having GI flu ("rotavirus"). She was given Phenergan and Rocephin with Zantac and Pepto-Bismol. She was advised to use Imodium-AD if diarrhea not controlled. She was to follow the Rocephin with Ceftin the next day. Culture was repeated and reported mixed bacteria at >100,000 colonies. Continue to be unable to keep medication down because of N,V & D. Feeling weak with light headed sensation when she stands.  Past Medical History  Diagnosis Date  . SVT (supraventricular tachycardia) (HCC)     history of  . Pneumonia   . TIA (transient ischemic attack) 2010    Negative carotids.   . Diverticulosis   . Hypercalcemia     Secondar to calcium supplements. Normal PTH   Past  Surgical History  Procedure Laterality Date  . Appendectomy  1954  . Cholecystectomy  1981  . Cardiac catheterization  [id number]   Family History  Problem Relation Age of Onset  . Heart disease Father   . Dementia Father   . Heart disease Mother   . Stroke    . Heart disease Brother    Previous Medications   ALBUTEROL (PROVENTIL HFA;VENTOLIN HFA) 108 (90 BASE) MCG/ACT INHALER    Inhale 1-2 puffs into the lungs every 6 (six) hours as needed for wheezing or shortness of breath.   ALPRAZOLAM (XANAX) 0.5 MG TABLET    Take 0.5 mg by mouth at bedtime as needed for sleep. Reported on 03/06/2015   ASPIRIN 81 MG TABLET    Take 81 mg by mouth daily.   BISMUTH SUBSALICYLATE (PEPTO-BISMOL) 262 MG/15ML SUSPENSION    Take 30 mLs by mouth 3 (three) times daily. Take with Zantac for 3-5 days the Pepto-Bismol may be in the equivalent as the DS tablets   BUDESONIDE-FORMOTEROL (SYMBICORT) 160-4.5 MCG/ACT INHALER    Inhale 2 puffs into the lungs 2 (two) times daily.   CALCIUM CARBONATE-VITAMIN D (CALTRATE 600+D PO)    Take by mouth daily.    CEFUROXIME (CEFTIN) 500 MG TABLET    Take 1 tablet (500 mg total) by mouth 2 (two) times daily.   CHOLECALCIFEROL (VITAMIN D) 2000 UNITS TABLET    Take 2,000 Units by mouth daily.   CLOPIDOGREL (  PLAVIX) 75 MG TABLET    TAKE (1) TABLET BY MOUTH EVERY DAY WITH BREAKFAST   CYANOCOBALAMIN (,VITAMIN B-12,) 1000 MCG/ML INJECTION    INJECT 1 ML INTRAMUSCULARLY MONTHLY.   CYCLOBENZAPRINE (FLEXERIL) 10 MG TABLET    Take 0.5-1 tablets (5-10 mg total) by mouth at bedtime as needed for muscle spasms.   DIPHENOXYLATE-ATROPINE (LOMOTIL) 2.5-0.025 MG TABLET    Take 1 tablet by mouth 3 (three) times daily as needed for diarrhea or loose stools.   EZETIMIBE (ZETIA) 10 MG TABLET    Take 1 tablet (10 mg total) by mouth daily.   FLUTICASONE (FLONASE) 50 MCG/ACT NASAL SPRAY    Place 2 sprays into both nostrils daily.   IBANDRONATE (BONIVA) 150 MG TABLET    1 tablet once a month.   IBUPROFEN  (ADVIL,MOTRIN) 200 MG TABLET    Take 200 mg by mouth every 6 (six) hours as needed.   LORATADINE (CLARITIN) 10 MG TABLET    Take 10 mg by mouth daily.   METOPROLOL SUCCINATE (TOPROL-XL) 50 MG 24 HR TABLET    TAKE ONE (1) TABLET BY MOUTH ONCE DAILY   PANTOPRAZOLE (PROTONIX) 40 MG TABLET    Take 1 tablet (40 mg total) by mouth daily.   PHENAZOPYRIDINE (PYRIDIUM) 200 MG TABLET    Take 1 tablet (200 mg total) by mouth 3 (three) times daily as needed for pain.   RANITIDINE (ZANTAC) 150 MG CAPSULE    Take 1 capsule (150 mg total) by mouth 3 (three) times daily. With Pepto-Bismol for 3-5 days   VITAMIN B-12 (CYANOCOBALAMIN) 1000 MCG TABLET    Take 1,000 mcg by mouth daily.   VITAMIN C (ASCORBIC ACID) 500 MG TABLET    Take 500 mg by mouth daily.   VITAMIN E 1000 UNIT CAPSULE    Take 1,000 Units by mouth daily.   Allergies  Allergen Reactions  . Fish Oil   . Simvastatin     Other reaction(s): Muscle Pain    Review of Systems  Constitutional: Negative.   HENT: Negative.   Eyes: Negative.   Respiratory: Negative.   Cardiovascular: Negative.   Gastrointestinal: Positive for nausea, vomiting, abdominal pain and diarrhea.  Endocrine: Negative.   Genitourinary: Negative.   Musculoskeletal: Negative.   Skin: Negative.   Allergic/Immunologic: Negative.   Neurological: Negative.   Hematological: Negative.   Psychiatric/Behavioral: Negative.     Social History  Substance Use Topics  . Smoking status: Never Smoker   . Smokeless tobacco: Never Used  . Alcohol Use: No   Objective:   BP 140/102 mmHg  Pulse 71  Temp(Src) 98 F (36.7 C) (Oral)  Resp 14  Wt 150 lb 3.2 oz (68.13 kg) Wt Readings from Last 3 Encounters:  07/08/15 150 lb 3.2 oz (68.13 kg)  07/04/15 158 lb (71.668 kg)  06/26/15 158 lb (71.668 kg)   BP Readings from Last 3 Encounters:  07/08/15 140/102  07/04/15 159/84  06/26/15 120/78     Physical Exam  Constitutional: She is oriented to person, place, and time. She  appears well-developed and well-nourished. No distress.  HENT:  Head: Normocephalic and atraumatic.  Right Ear: Hearing normal.  Left Ear: Hearing normal.  Nose: Nose normal.  Eyes: Conjunctivae and lids are normal. Right eye exhibits no discharge. Left eye exhibits no discharge. No scleral icterus.  Neck: Neck supple.  Cardiovascular: Normal rate and regular rhythm.   Pulmonary/Chest: Effort normal and breath sounds normal. No respiratory distress.  Abdominal:  Hyperactive bowel sounds  with generalized soreness. No CVA tenderness to percussion posteriorly.  Musculoskeletal: Normal range of motion.  Neurological: She is alert and oriented to person, place, and time.  Skin: Skin is intact. No lesion and no rash noted.  Psychiatric: She has a normal mood and affect. Her speech is normal and behavior is normal. Thought content normal.      Assessment & Plan:     1. Hypovolemia dehydration Persistent nausea, vomiting and diarrhea with orthostatic drop in BP (158/90 supine - 118/88 standing with pulse up to 102). Unable to keep regular medications or food down despite antiemetic. Discussed situation with Dr. Caryn Section and recommended she go to the hospital for evaluation and IV hydration.  2. UTI (lower urinary tract infection) Persistent UTI that started 06-23-15. Initial culture isolated E.coli. Unable to take antibiotics due to GI upset. Recheck of culture at the Urgent Care on 07-04-15 reported mixed bacteria >100,000 colonies. No fever.

## 2015-07-09 ENCOUNTER — Telehealth: Payer: Self-pay | Admitting: Family Medicine

## 2015-07-09 ENCOUNTER — Encounter: Payer: Self-pay | Admitting: Family Medicine

## 2015-07-09 ENCOUNTER — Ambulatory Visit (INDEPENDENT_AMBULATORY_CARE_PROVIDER_SITE_OTHER): Payer: Medicare Other | Admitting: Family Medicine

## 2015-07-09 VITALS — BP 136/88 | HR 60 | Temp 97.7°F | Resp 16 | Wt 153.8 lb

## 2015-07-09 DIAGNOSIS — N179 Acute kidney failure, unspecified: Secondary | ICD-10-CM

## 2015-07-09 DIAGNOSIS — K529 Noninfective gastroenteritis and colitis, unspecified: Secondary | ICD-10-CM | POA: Diagnosis not present

## 2015-07-09 NOTE — Telephone Encounter (Signed)
Suanne Marker, Lorana's daughter, called stating [facility] in [place] told her that her Monourol (Fosfomyscin) has to have a prior authorization.  She has to start taking this Thursday (5/11)

## 2015-07-09 NOTE — Progress Notes (Signed)
Patient ID: Jade Gardner, female   DOB: 1945/10/14, 70 y.o.   MRN: [id number]   Patient: Jade Gardner Female    DOB: 1945/07/28   70 y.o.   MRN: [id number] Visit Date: 07/09/2015  Today's Provider: Vernie Murders, PA   Chief Complaint  Patient presents with  . Hospitalization Follow-up   Subjective:    HPI  Follow up Hospitalization  Patient was admitted to [facility] on 07/08/2015 and discharged on 07/08/2015. She was treated for Gastroenteritis and acute kidney injury. Treatment for this included started Fosfomycin 3 g pack and Ondansetron 4 mg. Stopped Ceftin. She reports good compliance with treatment. She reports this condition is Improved.  ------------------------------------------------------------------------------------  Past Medical History  Diagnosis Date  . SVT (supraventricular tachycardia) (HCC)     history of  . Pneumonia   . TIA (transient ischemic attack) 2010    Negative carotids.   . Diverticulosis   . Hypercalcemia     Secondar to calcium supplements. Normal PTH   Past Surgical History  Procedure Laterality Date  . Appendectomy  1954  . Cholecystectomy  1981  . Cardiac catheterization  [id number]   Family History  Problem Relation Age of Onset  . Heart disease Father   . Dementia Father   . Heart disease Mother   . Stroke    . Heart disease Brother    Previous Medications   ALBUTEROL (PROVENTIL HFA;VENTOLIN HFA) 108 (90 BASE) MCG/ACT INHALER    Inhale 1-2 puffs into the lungs every 6 (six) hours as needed for wheezing or shortness of breath.   ALPRAZOLAM (XANAX) 0.5 MG TABLET    Take 0.5 mg by mouth at bedtime as needed for sleep. Reported on 03/06/2015   ASPIRIN 81 MG TABLET    Take 81 mg by mouth daily.   BISMUTH SUBSALICYLATE (PEPTO-BISMOL) 262 MG/15ML SUSPENSION    Take 30 mLs by mouth 3 (three) times daily. Take with Zantac for 3-5 days the Pepto-Bismol may be in the equivalent as the DS tablets   BUDESONIDE-FORMOTEROL (SYMBICORT) 160-4.5  MCG/ACT INHALER    Inhale 2 puffs into the lungs 2 (two) times daily.   CALCIUM CARBONATE-VITAMIN D (CALTRATE 600+D PO)    Take by mouth daily.    CHOLECALCIFEROL (VITAMIN D) 2000 UNITS TABLET    Take 2,000 Units by mouth daily.   CLOPIDOGREL (PLAVIX) 75 MG TABLET    TAKE (1) TABLET BY MOUTH EVERY DAY WITH BREAKFAST   CYANOCOBALAMIN (,VITAMIN B-12,) 1000 MCG/ML INJECTION    INJECT 1 ML INTRAMUSCULARLY MONTHLY.   CYCLOBENZAPRINE (FLEXERIL) 10 MG TABLET    Take 0.5-1 tablets (5-10 mg total) by mouth at bedtime as needed for muscle spasms.   DIPHENOXYLATE-ATROPINE (LOMOTIL) 2.5-0.025 MG TABLET    Take 1 tablet by mouth 3 (three) times daily as needed for diarrhea or loose stools.   EZETIMIBE (ZETIA) 10 MG TABLET    Take 1 tablet (10 mg total) by mouth daily.   FLUTICASONE (FLONASE) 50 MCG/ACT NASAL SPRAY    Place 2 sprays into both nostrils daily.   FOSFOMYCIN (MONUROL) 3 G PACK    Take 3 g by mouth once. Please mix in 8 oz of water, take by mouth once   IBANDRONATE (BONIVA) 150 MG TABLET    1 tablet once a month.   IBUPROFEN (ADVIL,MOTRIN) 200 MG TABLET    Take 200 mg by mouth every 6 (six) hours as needed.   LORATADINE (CLARITIN) 10 MG TABLET    Take 10 mg by  mouth daily.   METOPROLOL SUCCINATE (TOPROL-XL) 50 MG 24 HR TABLET    TAKE ONE (1) TABLET BY MOUTH ONCE DAILY   ONDANSETRON (ZOFRAN ODT) 4 MG DISINTEGRATING TABLET    Take 1 tablet (4 mg total) by mouth every 6 (six) hours as needed for nausea or vomiting.   PANTOPRAZOLE (PROTONIX) 40 MG TABLET    Take 1 tablet (40 mg total) by mouth daily.   PHENAZOPYRIDINE (PYRIDIUM) 200 MG TABLET    Take 1 tablet (200 mg total) by mouth 3 (three) times daily as needed for pain.   RANITIDINE (ZANTAC) 150 MG CAPSULE    Take 1 capsule (150 mg total) by mouth 3 (three) times daily. With Pepto-Bismol for 3-5 days   VITAMIN B-12 (CYANOCOBALAMIN) 1000 MCG TABLET    Take 1,000 mcg by mouth daily.   VITAMIN C (ASCORBIC ACID) 500 MG TABLET    Take 500 mg by mouth  daily.   VITAMIN E 1000 UNIT CAPSULE    Take 1,000 Units by mouth daily.   Allergies  Allergen Reactions  . Fish Oil   . Simvastatin     Other reaction(s): Muscle Pain    Review of Systems  Constitutional: Negative.   HENT: Negative.   Eyes: Negative.   Respiratory: Negative.   Cardiovascular: Negative.   Gastrointestinal: Positive for diarrhea.  Endocrine: Negative.   Genitourinary: Negative.   Musculoskeletal: Negative.   Skin: Negative.   Allergic/Immunologic: Negative.   Neurological: Negative.   Hematological: Negative.   Psychiatric/Behavioral: Negative.     Social History  Substance Use Topics  . Smoking status: Never Smoker   . Smokeless tobacco: Never Used  . Alcohol Use: No   Objective:   BP 136/88 mmHg  Pulse 60  Temp(Src) 97.7 F (36.5 C) (Oral)  Resp 16  Wt 153 lb 12.8 oz (69.763 kg)  Physical Exam  Constitutional: She is oriented to person, place, and time. She appears well-developed and well-nourished.  HENT:  Head: Normocephalic.  Eyes: Conjunctivae are normal.  Neck: Neck supple.  Cardiovascular: Normal rate and regular rhythm.   Pulmonary/Chest: Effort normal and breath sounds normal.  Abdominal: Soft. She exhibits no mass. There is no tenderness. There is no guarding.  Hyperactive bowel sounds. No CVA tenderness to percussion posteriorly.  Neurological: She is alert and oriented to person, place, and time.  Psychiatric: She has a normal mood and affect. Her behavior is normal.      Assessment & Plan:     1. Gastroenteritis Had 1-2 loose stools daily for about 3 weeks. Sent to the ER yesterday for suspected dehydration with orthostatic drop in BP in the office. Fluid hydration at the ER and she feels better today with less diarrhea. Will get follow up labs and recheck pending reports. - CBC with Differential/Platelet - Basic Metabolic Panel (BMET)  2. Acute kidney injury ([place]) Was given Fosfomycin for suspected UTI. Creatinine elevated  to 1.44 in the ER. Feeling stronger today. Will check labs for onset of renal failure. Proceed with second dose of Fosfomycin. Initial urine culture isolated E. Coli. Awaiting repeat culture report. - CBC with Differential/Platelet - Basic Metabolic Panel (BMET)

## 2015-07-09 NOTE — Telephone Encounter (Signed)
Please proceed with PA process to get this antibiotic for her persistent UTI not cleared by Cipro or Macrobid due to N,V &D. ER doctor started Fosfomycin on 07-08-15 with instructions for a second dose in 72 hours. Initial culture isolated E. Coli - repeat culture report pending.

## 2015-07-10 ENCOUNTER — Telehealth: Payer: Self-pay | Admitting: Family Medicine

## 2015-07-10 LAB — BASIC METABOLIC PANEL
BUN/Creatinine Ratio: 8 — ABNORMAL LOW (ref 12–28)
BUN: 11 mg/dL (ref 8–27)
CALCIUM: 9.6 mg/dL (ref 8.7–10.3)
CHLORIDE: 105 mmol/L (ref 96–106)
CO2: 21 mmol/L (ref 18–29)
CREATININE: 1.3 mg/dL — AB (ref 0.57–1.00)
GFR calc non Af Amer: 41 mL/min/{1.73_m2} — ABNORMAL LOW (ref >59)
GFR, EST AFRICAN AMERICAN: 48 mL/min/{1.73_m2} — AB (ref >59)
Glucose: 97 mg/dL (ref 65–99)
Potassium: 4.2 mmol/L (ref 3.5–5.2)
Sodium: 142 mmol/L (ref 134–144)

## 2015-07-10 LAB — CBC WITH DIFFERENTIAL/PLATELET
BASOS ABS: 0.1 10*3/uL (ref 0.0–0.2)
Basos: 1 %
EOS (ABSOLUTE): 0.3 10*3/uL (ref 0.0–0.4)
EOS: 5 %
HEMATOCRIT: 41.9 % (ref 34.0–46.6)
HEMOGLOBIN: 14.2 g/dL (ref 11.1–15.9)
IMMATURE GRANS (ABS): 0 10*3/uL (ref 0.0–0.1)
IMMATURE GRANULOCYTES: 0 %
Lymphocytes Absolute: 2.6 10*3/uL (ref 0.7–3.1)
Lymphs: 46 %
MCH: 29.4 pg (ref 26.6–33.0)
MCHC: 33.9 g/dL (ref 31.5–35.7)
MCV: 87 fL (ref 79–97)
MONOS ABS: 0.4 10*3/uL (ref 0.1–0.9)
Monocytes: 8 %
NEUTROS PCT: 40 %
Neutrophils Absolute: 2.2 10*3/uL (ref 1.4–7.0)
PLATELETS: 275 10*3/uL (ref 150–379)
RBC: 4.83 x10E6/uL (ref 3.77–5.28)
RDW: 13.3 % (ref 12.3–15.4)
WBC: 5.5 10*3/uL (ref 3.4–10.8)

## 2015-07-10 LAB — URINE CULTURE: Special Requests: NORMAL

## 2015-07-10 NOTE — Telephone Encounter (Signed)
Slightly impaired kidney functions, otherwise labs normal. Be sure to drink plenty of fluids.

## 2015-07-10 NOTE — Telephone Encounter (Signed)
Pt is requesting results from lab work.  CB#[phone]

## 2015-07-10 NOTE — Telephone Encounter (Signed)
Please review-aa 

## 2015-07-11 ENCOUNTER — Telehealth: Payer: Self-pay

## 2015-07-11 NOTE — Telephone Encounter (Signed)
-----   Message from Margo Common, [place] sent at 07/11/2015  8:26 AM EDT ----- Kidney function improving. Continue fluid intake and recheck labs once more in 3-4 weeks.

## 2015-07-11 NOTE — Telephone Encounter (Signed)
Patient advised as directed below. Patient verbalized understanding.  

## 2015-07-11 NOTE — Telephone Encounter (Signed)
Patient advised and verbally voiced understanding.  

## 2015-07-12 ENCOUNTER — Ambulatory Visit: Payer: Medicare Other | Admitting: Family Medicine

## 2015-08-05 ENCOUNTER — Encounter: Payer: Self-pay | Admitting: Family Medicine

## 2015-08-05 ENCOUNTER — Ambulatory Visit (INDEPENDENT_AMBULATORY_CARE_PROVIDER_SITE_OTHER): Payer: Medicare Other | Admitting: Family Medicine

## 2015-08-05 VITALS — BP 136/82 | HR 68 | Temp 97.9°F | Resp 16

## 2015-08-05 DIAGNOSIS — Z8719 Personal history of other diseases of the digestive system: Secondary | ICD-10-CM

## 2015-08-05 DIAGNOSIS — N179 Acute kidney failure, unspecified: Secondary | ICD-10-CM | POA: Diagnosis not present

## 2015-08-05 LAB — POCT URINALYSIS DIPSTICK
Bilirubin, UA: NEGATIVE
Glucose, UA: NEGATIVE
Ketones, UA: NEGATIVE
Leukocytes, UA: NEGATIVE
Nitrite, UA: NEGATIVE
PH UA: 6
PROTEIN UA: NEGATIVE
RBC UA: NEGATIVE
SPEC GRAV UA: 1.015
UROBILINOGEN UA: 0.2

## 2015-08-05 NOTE — Progress Notes (Signed)
Patient ID: Jade Gardner, female   DOB: Oct 04, 1945, 70 y.o.   MRN: [id number]       Patient: Jade Gardner Female    DOB: [birth year]   70 y.o.   MRN: [id number] Visit Date: 08/05/2015  Today's Provider: Vernie Murders, PA   No chief complaint on file.  Subjective:    HPI Patient is here to follow up on acute kidney injury. Patient was seen in the ED and in our office on 07/08/15 due to vomiting and diarrhea. On 07/09/15, her kidney function was improving, but it was recommended that we recheck renal panel to ensure stability. Patient reports that she is feeling much better. No longer having diarrhea or vomiting. Eating well. Urine cultures and CBC showed infections of significance 07-08-15. No fevers or dysuria now.    Past Medical History  Diagnosis Date  . SVT (supraventricular tachycardia) (HCC)     history of  . Pneumonia   . TIA (transient ischemic attack) 2010    Negative carotids.   . Diverticulosis   . Hypercalcemia     Secondar to calcium supplements. Normal PTH   Past Surgical History  Procedure Laterality Date  . Appendectomy  1954  . Cholecystectomy  1981  . Cardiac catheterization  [id number]   Family History  Problem Relation Age of Onset  . Heart disease Father   . Dementia Father   . Heart disease Mother   . Stroke    . Heart disease Brother    Allergies  Allergen Reactions  . Fish Oil   . Simvastatin     Other reaction(s): Muscle Pain   Previous Medications   ALBUTEROL (PROVENTIL HFA;VENTOLIN HFA) 108 (90 BASE) MCG/ACT INHALER    Inhale 1-2 puffs into the lungs every 6 (six) hours as needed for wheezing or shortness of breath.   ALPRAZOLAM (XANAX) 0.5 MG TABLET    Take 0.5 mg by mouth at bedtime as needed for sleep. Reported on 03/06/2015   ASPIRIN 81 MG TABLET    Take 81 mg by mouth daily.   BUDESONIDE-FORMOTEROL (SYMBICORT) 160-4.5 MCG/ACT INHALER    Inhale 2 puffs into the lungs 2 (two) times daily.   CALCIUM CARBONATE-VITAMIN D (CALTRATE 600+D  PO)    Take by mouth daily.    CHOLECALCIFEROL (VITAMIN D) 2000 UNITS TABLET    Take 2,000 Units by mouth daily.   CLOPIDOGREL (PLAVIX) 75 MG TABLET    TAKE (1) TABLET BY MOUTH EVERY DAY WITH BREAKFAST   CYANOCOBALAMIN (,VITAMIN B-12,) 1000 MCG/ML INJECTION    INJECT 1 ML INTRAMUSCULARLY MONTHLY.   CYCLOBENZAPRINE (FLEXERIL) 10 MG TABLET    Take 0.5-1 tablets (5-10 mg total) by mouth at bedtime as needed for muscle spasms.   EZETIMIBE (ZETIA) 10 MG TABLET    Take 1 tablet (10 mg total) by mouth daily.   FLUTICASONE (FLONASE) 50 MCG/ACT NASAL SPRAY    Place 2 sprays into both nostrils daily.   IBANDRONATE (BONIVA) 150 MG TABLET    1 tablet once a month.   IBUPROFEN (ADVIL,MOTRIN) 200 MG TABLET    Take 200 mg by mouth every 6 (six) hours as needed.   LORATADINE (CLARITIN) 10 MG TABLET    Take 10 mg by mouth daily.   METOPROLOL SUCCINATE (TOPROL-XL) 50 MG 24 HR TABLET    TAKE ONE (1) TABLET BY MOUTH ONCE DAILY   ONDANSETRON (ZOFRAN ODT) 4 MG DISINTEGRATING TABLET    Take 1 tablet (4 mg total) by mouth every 6 (six)  hours as needed for nausea or vomiting.   PANTOPRAZOLE (PROTONIX) 40 MG TABLET    Take 1 tablet (40 mg total) by mouth daily.   RANITIDINE (ZANTAC) 150 MG CAPSULE    Take 1 capsule (150 mg total) by mouth 3 (three) times daily. With Pepto-Bismol for 3-5 days   VITAMIN B-12 (CYANOCOBALAMIN) 1000 MCG TABLET    Take 1,000 mcg by mouth daily. Reported on 08/05/2015   VITAMIN C (ASCORBIC ACID) 500 MG TABLET    Take 500 mg by mouth daily.   VITAMIN E 1000 UNIT CAPSULE    Take 1,000 Units by mouth daily.    Review of Systems  Constitutional: Negative.   Respiratory: Negative.   Cardiovascular: Negative.   Gastrointestinal: Negative.   Genitourinary: Negative.     Social History  Substance Use Topics  . Smoking status: Never Smoker   . Smokeless tobacco: Never Used  . Alcohol Use: No   Objective:   BP 136/82 mmHg  Temp(Src) 97.9 F (36.6 C)  Resp 16  Wt   Physical Exam    Constitutional: She is oriented to person, place, and time. She appears well-developed and well-nourished. No distress.  HENT:  Head: Normocephalic and atraumatic.  Right Ear: Hearing normal.  Left Ear: Hearing normal.  Nose: Nose normal.  Eyes: Conjunctivae and lids are normal. Right eye exhibits no discharge. Left eye exhibits no discharge. No scleral icterus.  Neck: Neck supple.  Cardiovascular: Normal rate and regular rhythm.   Pulmonary/Chest: Effort normal. No respiratory distress.  Abdominal: Soft. Bowel sounds are normal.  Musculoskeletal: Normal range of motion.  Neurological: She is alert and oriented to person, place, and time.  Skin: Skin is intact. No lesion and no rash noted.  Psychiatric: She has a normal mood and affect. Her speech is normal and behavior is normal. Thought content normal.      Assessment & Plan:     1. Acute nontraumatic kidney injury ([place]) Urinalysis clear today. No signs of protein or infection. Finished all the Macrobid and feeling very well. No significant urgency or frequency. Will recheck renal function and WBC count. Continue to drink plenty of water in diet. Recheck prn. - POCT urinalysis dipstick - CBC with Differential/Platelet - Comprehensive metabolic panel  2. Hx of gastroenteritis No longer having diarrhea or nausea. Back to normal soft solid stool daily and eating without nausea or vomiting. Recheck labs and follow up prn. - CBC with Differential/Platelet - Comprehensive metabolic panel    Vernie Murders, PA  [facility]

## 2015-08-06 LAB — COMPREHENSIVE METABOLIC PANEL
A/G RATIO: 1.3 (ref 1.2–2.2)
ALT: 21 IU/L (ref 0–32)
AST: 25 IU/L (ref 0–40)
Albumin: 4.1 g/dL (ref 3.5–4.8)
Alkaline Phosphatase: 59 IU/L (ref 39–117)
BILIRUBIN TOTAL: 0.3 mg/dL (ref 0.0–1.2)
BUN/Creatinine Ratio: 12 (ref 12–28)
BUN: 11 mg/dL (ref 8–27)
CHLORIDE: 102 mmol/L (ref 96–106)
CO2: 21 mmol/L (ref 18–29)
Calcium: 9.9 mg/dL (ref 8.7–10.3)
Creatinine, Ser: 0.9 mg/dL (ref 0.57–1.00)
GFR calc non Af Amer: 65 mL/min/{1.73_m2} (ref >59)
GFR, EST AFRICAN AMERICAN: 74 mL/min/{1.73_m2} (ref >59)
Globulin, Total: 3.2 g/dL (ref 1.5–4.5)
Glucose: 89 mg/dL (ref 65–99)
POTASSIUM: 4.5 mmol/L (ref 3.5–5.2)
Sodium: 141 mmol/L (ref 134–144)
TOTAL PROTEIN: 7.3 g/dL (ref 6.0–8.5)

## 2015-08-06 LAB — CBC WITH DIFFERENTIAL/PLATELET
BASOS: 1 %
Basophils Absolute: 0 10*3/uL (ref 0.0–0.2)
EOS (ABSOLUTE): 0.4 10*3/uL (ref 0.0–0.4)
Eos: 5 %
Hematocrit: 40.8 % (ref 34.0–46.6)
Hemoglobin: 13.2 g/dL (ref 11.1–15.9)
Immature Grans (Abs): 0 10*3/uL (ref 0.0–0.1)
Immature Granulocytes: 0 %
Lymphocytes Absolute: 3.2 10*3/uL — ABNORMAL HIGH (ref 0.7–3.1)
Lymphs: 46 %
MCH: 28.5 pg (ref 26.6–33.0)
MCHC: 32.4 g/dL (ref 31.5–35.7)
MCV: 88 fL (ref 79–97)
MONOS ABS: 0.6 10*3/uL (ref 0.1–0.9)
Monocytes: 8 %
NEUTROS ABS: 2.8 10*3/uL (ref 1.4–7.0)
Neutrophils: 40 %
PLATELETS: 249 10*3/uL (ref 150–379)
RBC: 4.63 x10E6/uL (ref 3.77–5.28)
RDW: 13.7 % (ref 12.3–15.4)
WBC: 6.9 10*3/uL (ref 3.4–10.8)

## 2015-09-05 ENCOUNTER — Encounter: Payer: Self-pay | Admitting: Family Medicine

## 2015-09-05 ENCOUNTER — Ambulatory Visit (INDEPENDENT_AMBULATORY_CARE_PROVIDER_SITE_OTHER): Payer: Medicare Other | Admitting: Family Medicine

## 2015-09-05 ENCOUNTER — Encounter: Payer: Medicare Other | Admitting: Family Medicine

## 2015-09-05 VITALS — BP 132/60 | HR 68 | Temp 98.1°F | Resp 16 | Wt 158.0 lb

## 2015-09-05 DIAGNOSIS — Z0000 Encounter for general adult medical examination without abnormal findings: Secondary | ICD-10-CM

## 2015-09-05 DIAGNOSIS — M8580 Other specified disorders of bone density and structure, unspecified site: Secondary | ICD-10-CM | POA: Diagnosis not present

## 2015-09-05 DIAGNOSIS — K2100 Gastro-esophageal reflux disease with esophagitis, without bleeding: Secondary | ICD-10-CM

## 2015-09-05 DIAGNOSIS — Z1231 Encounter for screening mammogram for malignant neoplasm of breast: Secondary | ICD-10-CM

## 2015-09-05 DIAGNOSIS — I10 Essential (primary) hypertension: Secondary | ICD-10-CM

## 2015-09-05 DIAGNOSIS — M5135 Other intervertebral disc degeneration, thoracolumbar region: Secondary | ICD-10-CM

## 2015-09-05 DIAGNOSIS — I459 Conduction disorder, unspecified: Secondary | ICD-10-CM

## 2015-09-05 DIAGNOSIS — G4700 Insomnia, unspecified: Secondary | ICD-10-CM

## 2015-09-05 DIAGNOSIS — E7800 Pure hypercholesterolemia, unspecified: Secondary | ICD-10-CM | POA: Diagnosis not present

## 2015-09-05 NOTE — Progress Notes (Signed)
Patient: Jade Gardner, Female    DOB: 1946/01/17, 70 y.o.   MRN: [id number] Visit Date: 09/05/2015  Today's Provider: Vernie Murders, PA   Chief Complaint  Patient presents with  . Annual Wellness Exam   Subjective:    Annual wellness visit Jade Gardner is a 70 y.o. female. She feels fairly well. She reports exercising occasionally. She reports she is sleeping fairly well.  -----------------------------------------------------------   Review of Systems  Constitutional: Negative.   HENT: Negative.   Eyes: Negative.   Respiratory: Negative.   Cardiovascular: Negative.   Gastrointestinal: Negative.   Endocrine: Negative.   Genitourinary: Negative.   Musculoskeletal: Positive for back pain. Negative for joint swelling.       Some spasms with standing too long. Some constant 4-5/10 pain in mid back (thoracolumbar region) for 30 years. Relief from ice pack or heat pad. Uses Flexeril and Advil/Aleve for flares.  Psychiatric/Behavioral: Positive for sleep disturbance and dysphoric mood.       Intermittent symptoms with spontaneous crying spells associated with husband's death 5 years ago.    Social History   Social History  . Marital Status: Widowed    Spouse Name: N/A  . Number of Children: 2  . Years of Education: HS Grad   Occupational History  . Retired     [employer]   Social History Main Topics  . Smoking status: Never Smoker   . Smokeless tobacco: Never Used  . Alcohol Use: No  . Drug Use: No  . Sexual Activity: Not on file   Other Topics Concern  . Not on file   Social History Narrative    Past Medical History  Diagnosis Date  . SVT (supraventricular tachycardia) (HCC)     history of  . Pneumonia   . TIA (transient ischemic attack) 2010    Negative carotids.   . Diverticulosis   . Hypercalcemia     Secondar to calcium supplements. Normal PTH     Patient Active Problem List   Diagnosis Date Noted  . Hay fever 05/29/2015    . NASH (nonalcoholic steatohepatitis) 05/29/2015  . H/O transient cerebral ischemia 05/29/2015  . Personal history of other diseases of the musculoskeletal system and connective tissue 05/29/2015  . Diverticulosis 08/24/2014  . Fibrocystic breast disease 08/24/2014  . Acquired sideroblastic anemia 08/23/2014  . Allergic rhinitis 08/23/2014  . DDD (degenerative disc disease), thoracolumbar 08/23/2014  . Abnormal liver enzymes 08/23/2014  . Hearing loss 08/23/2014  . Insomnia 08/23/2014  . Lumbago 08/23/2014  . Osteopenia 08/23/2014  . Overweight (BMI 25.0-29.9) 08/23/2014  . Pre-diabetes 08/23/2014  . Obstructive sleep apnea 08/23/2014  . Varicose vein 08/23/2014  . B12 deficiency 08/23/2014  . Vitamin D deficiency 08/23/2014  . Malaise and fatigue 09/15/2013  . Carotid stenosis 09/15/2013  . Hypertension, essential, benign 05/26/2011  . SVT (supraventricular tachycardia) ([place]) 05/26/2011  . Menopause 01/02/2011  . Nonalcoholic steatohepatitis ([facility]) 05/20/2009  . Hypercholesterolemia without hypertriglyceridemia 05/20/2009  . Pure hypercholesterolemia 05/20/2009  . Diaphragmatic hernia 08/10/2008  . Cardiac conduction disorder 08/10/2008  . Benign essential HTN 08/10/2008  . GERD (gastroesophageal reflux disease) 08/10/2008    Past Surgical History  Procedure Laterality Date  . Appendectomy  1954  . Cholecystectomy  1981  . Cardiac catheterization  [id number]    Her family history includes Dementia in her father; Heart disease in her brother, father, and mother.    Current Meds  Medication Sig  . albuterol (PROVENTIL HFA;VENTOLIN  HFA) 108 (90 Base) MCG/ACT inhaler Inhale 1-2 puffs into the lungs every 6 (six) hours as needed for wheezing or shortness of breath.  . ALPRAZolam (XANAX) 0.5 MG tablet Take 0.5 mg by mouth at bedtime as needed for sleep. Reported on 03/06/2015  . aspirin 81 MG tablet Take 81 mg by mouth daily.  . Calcium Carbonate-Vitamin D (CALTRATE 600+D PO)  Take by mouth daily.   . Cholecalciferol (VITAMIN D) 2000 UNITS tablet Take 2,000 Units by mouth daily.  . clopidogrel (PLAVIX) 75 MG tablet TAKE (1) TABLET BY MOUTH EVERY DAY WITH BREAKFAST  . cyanocobalamin (,VITAMIN B-12,) 1000 MCG/ML injection INJECT 1 ML INTRAMUSCULARLY MONTHLY.  . cyclobenzaprine (FLEXERIL) 10 MG tablet Take 0.5-1 tablets (5-10 mg total) by mouth at bedtime as needed for muscle spasms.  Marland Kitchen ezetimibe (ZETIA) 10 MG tablet Take 1 tablet (10 mg total) by mouth daily.  . fluticasone (FLONASE) 50 MCG/ACT nasal spray Place 2 sprays into both nostrils daily.  Marland Kitchen ibandronate (BONIVA) 150 MG tablet 1 tablet once a month.  . ibuprofen (ADVIL,MOTRIN) 200 MG tablet Take 200 mg by mouth every 6 (six) hours as needed.  . loratadine (CLARITIN) 10 MG tablet Take 10 mg by mouth daily.  . metoprolol succinate (TOPROL-XL) 50 MG 24 hr tablet TAKE ONE (1) TABLET BY MOUTH ONCE DAILY  . ondansetron (ZOFRAN ODT) 4 MG disintegrating tablet Take 1 tablet (4 mg total) by mouth every 6 (six) hours as needed for nausea or vomiting.  . pantoprazole (PROTONIX) 40 MG tablet Take 1 tablet (40 mg total) by mouth daily.  . ranitidine (ZANTAC) 150 MG capsule Take 1 capsule (150 mg total) by mouth 3 (three) times daily. With Pepto-Bismol for 3-5 days  . vitamin B-12 (CYANOCOBALAMIN) 1000 MCG tablet Take 1,000 mcg by mouth daily. Reported on 08/05/2015  . vitamin C (ASCORBIC ACID) 500 MG tablet Take 500 mg by mouth daily.  . vitamin E 1000 UNIT capsule Take 1,000 Units by mouth daily.   Current Facility-Administered Medications for the 09/05/15 encounter (Office Visit) with Margo Common, PA  Medication  . cyanocobalamin ((VITAMIN B-12)) injection 1,000 mcg    Patient Care Team: Birdie Sons, MD as PCP - General (Family Medicine)    Objective:   Vitals: BP 132/60 mmHg  Pulse 68  Temp(Src) 98.1 F (36.7 C)  Resp 16  Wt 158 lb (71.668 kg) Body mass index is 28.89 kg/(m^2).  Wt Readings from Last 3  Encounters:  09/05/15 158 lb (71.668 kg)  07/09/15 153 lb 12.8 oz (69.763 kg)  07/08/15 150 lb (68.04 kg)   Physical Exam  Constitutional: She is oriented to person, place, and time. She appears well-developed and well-nourished.  HENT:  Head: Normocephalic and atraumatic.  Right Ear: External ear normal.  Left Ear: External ear normal.  Nose: Nose normal.  Mouth/Throat: Oropharynx is clear and moist.  Eyes: Conjunctivae and EOM are normal. Pupils are equal, round, and reactive to light. Right eye exhibits no discharge.  Neck: Normal range of motion. Neck supple. No tracheal deviation present. No thyromegaly present.  Cardiovascular: Normal rate, regular rhythm, normal heart sounds and intact distal pulses.   No murmur heard. Pulmonary/Chest: Effort normal and breath sounds normal. No respiratory distress. She has no wheezes. She has no rales. She exhibits no tenderness.  Abdominal: Soft. She exhibits no distension and no mass. There is no tenderness. There is no rebound and no guarding.  Musculoskeletal: She exhibits no edema or tenderness.  Fair range  of motion throughout. Some back stiffness.  Lymphadenopathy:    She has no cervical adenopathy.  Neurological: She is alert and oriented to person, place, and time. She has normal reflexes. No cranial nerve deficit. She exhibits normal muscle tone. Coordination normal.  Skin: Skin is warm and dry. No rash noted. No erythema.  Psychiatric: Her speech is normal and behavior is normal. Judgment and thought content normal.  Slight sadness. No suicidal ideation.    Activities of Daily Living In your present state of health, do you have any difficulty performing the following activities: 09/05/2015  Hearing? Y  Vision? N  Difficulty concentrating or making decisions? N  Walking or climbing stairs? N  Dressing or bathing? N  Doing errands, shopping? N    Fall Risk Assessment Fall Risk  09/05/2015 08/23/2014  Falls in the past year? No No     Depression Screen PHQ 2/9 Scores 09/05/2015 08/23/2014  PHQ - 2 Score 2 0  PHQ- 9 Score 5 -    Cognitive Testing - 6-CIT  Correct? Score   What year is it? yes 0 0 or 4  What month is it? yes 0 0 or 3  Memorize:    Pia Mau,  41,  [address]  [place],      What time is it? (within 1 hour) yes 0 0 or 3  Count backwards from 20 yes 0 0, 2, or 4  Name the months of the year yes 0 0, 2, or 4  Repeat name & address above yes 0 0, 2, 4, 6, 8, or 10       TOTAL SCORE  0/28   Interpretation:  Normal  Normal (0-7) Abnormal (8-28)   Assessment & Plan:     Annual Wellness Visit  Reviewed patient's Family Medical History Reviewed and updated list of patient's medical providers Assessment of cognitive impairment was done Assessed patient's functional ability Established a written schedule for health screening [place] Completed and Reviewed  Exercise Activities and Dietary recommendations Goals    Continue to walk for exercise 20-30 minutes 3-4 times a week and follow low fat diet.      Immunization History  Administered Date(s) Administered  . Hepatitis A 12/17/2008, 01/14/2009  . Hepatitis B 12/17/2008, 01/14/2009, 06/14/2009, 06/14/2009  . Influenza,inj,Quad PF,36+ Mos 03/06/2015  . Influenza-Unspecified 12/31/2012, 11/08/2013  . Pneumococcal Conjugate-13 01/18/2014  . Pneumococcal Polysaccharide-23 08/12/2011    Health Maintenance  Topic Date Due  . Hepatitis C Screening  02-Mar-1946  . TETANUS/TDAP  03/08/1963  . MAMMOGRAM  03/07/1994  . ZOSTAVAX  03/07/2004  . INFLUENZA VACCINE  10/01/2015  . COLONOSCOPY  07/04/2019  . DEXA SCAN  Completed  . PNA vac Low Risk Adult  Completed      Discussed health benefits of physical activity, and encouraged her to engage in regular exercise appropriate for her age and condition.    ------------------------------------------------------------------------------------------------------------ 1. Medicare  annual wellness visit, subsequent Stable health with mild depression and persistent bereavement. No longer using Xanax. Immunizations up to date except unsure about last tetanus booster. Wants to check with insurance and her pharmacy about tetanus and shingles vaccinations. Recheck annually.  2. Benign essential HTN Very good control of BP and control of SVT taking Metoprolol-XL 50 mg qd.   3. Cardiac conduction disorder History of SVT. Will have follow up with Dr. Rockey Situ next month (August). No significant palpitations or chest pains recently.  4. Gastroesophageal reflux disease with esophagitis Tolerating Protonix with  good control of dyspepsia  5. DDD (degenerative disc disease), thoracolumbar Diagnosed in 2013 and continues to use Flexeril for muscle spasms and Ibuprofen for inflammation. Trying to exercise a little to maintain mobility. May use moist heat or Aspercreme with Lidocaine prn.  6. Osteopenia BMD in 2016 showed Boniva helped bring bone density back up to osteopenia versus osteoporosis. Will continue Caltrate-D also. Recheck BMD next year.   7. Insomnia Associated sleep disturbance due to living alone since her sone moved out 2 weeks ago and husband died 5 years ago. May use Tylenol-PM for now. May need to consider Trazodone if not improved.  8. Encounter for screening mammogram for breast cancer No masses or nipple discharge noted. - MM Digital Screening  9. Pure hypercholesterolemia Tolerating Zetia 10 mg qd and low fat diet. Trying to exercise some but limited by DDD. Will recheck lipid panel. - Lipid panel     Vernie Murders, PA  [facility]

## 2015-09-06 LAB — LIPID PANEL
CHOL/HDL RATIO: 5.7 ratio — AB (ref 0.0–4.4)
CHOLESTEROL TOTAL: 252 mg/dL — AB (ref 100–199)
HDL: 44 mg/dL (ref >39)
LDL Calculated: 164 mg/dL — ABNORMAL HIGH (ref 0–99)
TRIGLYCERIDES: 218 mg/dL — AB (ref 0–149)
VLDL Cholesterol Cal: 44 mg/dL — ABNORMAL HIGH (ref 5–40)

## 2015-09-10 ENCOUNTER — Other Ambulatory Visit: Payer: Self-pay

## 2015-09-10 ENCOUNTER — Telehealth: Payer: Self-pay

## 2015-09-10 MED ORDER — EZETIMIBE 10 MG PO TABS: 10.0000 mg | ORAL_TABLET | Freq: Every day | ORAL | Status: DC

## 2015-09-10 NOTE — Telephone Encounter (Signed)
May refill Zetia (generic) 10 mg qd #30 & 3 RF at her pharmacy of preference.Marland Kitchen

## 2015-09-10 NOTE — Telephone Encounter (Signed)
Advised patient of results. Patient is requesting refill on medication. She reports that her insurance may not pay for it. We may have to try something else. Ok to send medication into the pharmacy? Please advise. Thanks!

## 2015-09-10 NOTE — Telephone Encounter (Signed)
-----   Message from Margo Common, [place] sent at 09/06/2015  5:44 PM EDT ----- Cholesterol, triglycerides and LDL still high. Need to get back on Zetia 10 mg qd regularly, exercise 20-30 minutes 4 times a week and follow a low fat diet. May need other medications if no better on this regularly for 3 months.

## 2015-09-10 NOTE — Telephone Encounter (Signed)
Medication was sent into patient's pharmacy.

## 2015-09-12 ENCOUNTER — Other Ambulatory Visit: Payer: Self-pay | Admitting: Family Medicine

## 2015-09-30 ENCOUNTER — Telehealth: Payer: Self-pay | Admitting: Family Medicine

## 2015-10-08 NOTE — Telephone Encounter (Signed)
Ask patient if she has accomplished mammograms or if it is scheduled.

## 2015-10-31 ENCOUNTER — Encounter: Payer: Self-pay | Admitting: Cardiovascular Disease

## 2015-10-31 ENCOUNTER — Ambulatory Visit (INDEPENDENT_AMBULATORY_CARE_PROVIDER_SITE_OTHER): Payer: Medicare Other | Admitting: Cardiovascular Disease

## 2015-10-31 VITALS — BP 140/84 | HR 67 | Ht 62.5 in | Wt 159.0 lb

## 2015-10-31 DIAGNOSIS — I471 Supraventricular tachycardia: Secondary | ICD-10-CM | POA: Diagnosis not present

## 2015-10-31 DIAGNOSIS — I10 Essential (primary) hypertension: Secondary | ICD-10-CM | POA: Diagnosis not present

## 2015-10-31 DIAGNOSIS — I6523 Occlusion and stenosis of bilateral carotid arteries: Secondary | ICD-10-CM | POA: Diagnosis not present

## 2015-10-31 DIAGNOSIS — E7800 Pure hypercholesterolemia, unspecified: Secondary | ICD-10-CM | POA: Diagnosis not present

## 2015-10-31 MED ORDER — PRAVASTATIN SODIUM 20 MG PO TABS: 20.0000 mg | ORAL_TABLET | Freq: Every evening | ORAL | 6 refills | Status: DC

## 2015-10-31 MED ORDER — EZETIMIBE 10 MG PO TABS: 10.0000 mg | ORAL_TABLET | Freq: Every day | ORAL | 11 refills | Status: DC

## 2015-10-31 MED ORDER — LOVASTATIN 20 MG PO TABS: 20.0000 mg | ORAL_TABLET | Freq: Every day | ORAL | 6 refills | Status: DC

## 2015-10-31 NOTE — Progress Notes (Signed)
Cardiology Office Note  Date:  10/31/2015   ID:  Jade Gardner, DOB January 06, 1946, MRN [id number]  PCP:  Jade Huh, MD   Chief Complaint  Patient presents with  . Other    1 yr f/u c/o sob with exertion. Meds reviewed verbally with pt.    HPI:  Jade Gardner is a very pleasant 70 year old woman with a history of SVT, TIA in 0867 of uncertain etiology with H/A, hypertension, hyperlipidemia with total cholesterol in March of 2011 of 279, reaction to simvastatin, Jade Gardner,  cardiac catheterization in 25-May-1989 showing no significant coronary artery disease who presents for routine followup of her SVT and high cholesterol 40% bilateral carotid arterial disease Last cholesterol in 05/25/13 was 275  In follow-up, she reports having side effects on Crestor, Lipitor, simvastatin She would be open to trying alternate statin Denies any significant SVT episodes Previously was on livalo 2 mg daily. Was tolerating this, no longer takes the medication. Details unclear She does not take zetia Trying to exercise daily, active, works part time  EKG on today's visit shows normal sinus rhythm with rate 67 bpm, no significant ST or T-wave changes  Other past medical history  Previous TIA on 03/24/2013. She had acute onset of slurred speech, headache, dizziness, blurred vision, numbness on her for head. She went to the emergency room and blood pressure was 200/100. She stayed most of the day and was discharged home. TIA happened while on low-dose aspirin  CT scan of the head showed no stroke MRI of the head showed no acute stroke Lab work is essentially normal including basic metabolic panel and CBC  echocardiogram done that showed no ASD or PFO, otherwise normal study Carotid ultrasound showed bilateral 40-59% carotid arterial disease Holter monitor showed one short run of atrial tachycardia/SVT 7 beats, rare APCs  She had a difficult year 05-25-12  as her husband passed away last year from a reaction to  blood transfusion. She has had some problems adjusting, insomnia, increasing her weight. She has a supportive family and is trying to stay active.  For SVT, she has tried Cardizem in the past but had fatigue  Echocardiogram May 2006 showing mild LVH, mild MR, moderate TR, biventricular systolic pressure 46 mmHg with mild pulmonary hypertension  Perfusion stress test January 2006 showing normal ejection fraction, poor exercise tolerance on Bruce protocol for 3 minutes 30 seconds, or Apical septal ischemia  Significant family history with brother with bypass surgery, father with bypass surgery at age 89, mother with stroke and heart attack in her 60s  PMH:   has a past medical history of Diverticulosis; Hypercalcemia; Pneumonia; SVT (supraventricular tachycardia) (Jade Gardner); and TIA (transient ischemic attack) 05-25-08).  PSH:    Past Surgical History:  Procedure Laterality Date  . APPENDECTOMY  1954  . CARDIAC CATHETERIZATION  [id number]  . CHOLECYSTECTOMY  1981    Current Outpatient Prescriptions  Medication Sig Dispense Refill  . albuterol (PROVENTIL HFA;VENTOLIN HFA) 108 (90 Base) MCG/ACT inhaler Inhale 1-2 puffs into the lungs every 6 (six) hours as needed for wheezing or shortness of breath. 1 Inhaler 0  . aspirin 81 MG tablet Take 81 mg by mouth daily.    . budesonide-formoterol (SYMBICORT) 160-4.5 MCG/ACT inhaler Inhale 2 puffs into the lungs 2 (two) times daily. 1 Inhaler 0  . Calcium Carbonate-Vitamin D (CALTRATE 600+D PO) Take by mouth daily.     . Cholecalciferol (VITAMIN D) 2000 UNITS tablet Take 2,000 Units by mouth daily.    . clopidogrel (  PLAVIX) 75 MG tablet TAKE (1) TABLET BY MOUTH EVERY DAY WITH BREAKFAST 90 tablet 3  . cyanocobalamin (,VITAMIN B-12,) 1000 MCG/ML injection INJECT 1 ML INTRAMUSCULARLY MONTHLY. 1 mL 0  . cyclobenzaprine (FLEXERIL) 10 MG tablet Take 0.5-1 tablets (5-10 mg total) by mouth at bedtime as needed for muscle spasms. 30 tablet 1  . fluticasone (FLONASE)  50 MCG/ACT nasal spray Place 2 sprays into both nostrils daily. 16 g 2  . ibandronate (BONIVA) 150 MG tablet TAKE 1 TABLET BY MOUTH MONTHLY- DRINK WITH FULL GLASS OF WATER, ON AN EMPTY STOMACH AND WITH NO OTHER FOOD/DRINK/MEDICATION 1 tablet 12  . ibuprofen (ADVIL,MOTRIN) 200 MG tablet Take 200 mg by mouth every 6 (six) hours as needed.    . loratadine (CLARITIN) 10 MG tablet Take 10 mg by mouth daily.    . metoprolol succinate (TOPROL-XL) 50 MG 24 hr tablet TAKE ONE (1) TABLET BY MOUTH ONCE DAILY 30 tablet 3  . ondansetron (ZOFRAN ODT) 4 MG disintegrating tablet Take 1 tablet (4 mg total) by mouth every 6 (six) hours as needed for nausea or vomiting. 20 tablet 0  . pantoprazole (PROTONIX) 40 MG tablet Take 1 tablet (40 mg total) by mouth daily. 90 tablet 3  . ranitidine (ZANTAC) 150 MG capsule Take 1 capsule (150 mg total) by mouth 3 (three) times daily. With Pepto-Bismol for 3-5 days 15 capsule 0  . vitamin C (ASCORBIC ACID) 500 MG tablet Take 500 mg by mouth daily.    . vitamin E 1000 UNIT capsule Take 1,000 Units by mouth daily.    Marland Kitchen ezetimibe (ZETIA) 10 MG tablet Take 1 tablet (10 mg total) by mouth daily. 30 tablet 11  . lovastatin (MEVACOR) 20 MG tablet Take 1 tablet (20 mg total) by mouth at bedtime. 30 tablet 6   Current Facility-Administered Medications  Medication Dose Route Frequency Provider Last Rate Last Dose  . cyanocobalamin ((VITAMIN B-12)) injection 1,000 mcg  1,000 mcg Intramuscular Q30 days Jade Sons, MD   1,000 mcg at 08/23/14 1032     Allergies:   Fish oil and Simvastatin   Social History:  The patient  reports that she has never smoked. She has never used smokeless tobacco. She reports that she does not drink alcohol or use drugs.   Family History:   family history includes Dementia in her father; Heart disease in her brother, father, and mother.    Review of Systems: Review of Systems  Constitutional: Negative.   Respiratory: Negative.   Cardiovascular:  Negative.   Gastrointestinal: Negative.   Musculoskeletal: Negative.   Neurological: Negative.   Psychiatric/Behavioral: Negative.   All other systems reviewed and are negative.    PHYSICAL EXAM: VS:  BP 140/84 (BP Location: Left Arm, Patient Position: Sitting, Cuff Size: Normal)   Pulse 67   Ht 5' 2.5" (1.588 m)   Wt 159 lb (72.1 kg)   BMI 28.62 kg/m  , BMI Body mass index is 28.62 kg/m. GEN: Well nourished, well developed, in no acute distress  HEENT: normal  Neck: no JVD, carotid bruits, or masses Cardiac: RRR; no murmurs, rubs, or gallops,no edema  Respiratory:  clear to auscultation bilaterally, normal work of breathing GI: soft, nontender, nondistended, + BS MS: no deformity or atrophy  Skin: warm and dry, no rash Neuro:  Strength and sensation are intact Psych: euthymic mood, full affect    Recent Labs: 07/08/2015: Hemoglobin 14.1; Magnesium 1.7 08/05/2015: ALT 21; BUN 11; Creatinine, Ser 0.90; Platelets 249; Potassium  4.5; Sodium 141    Lipid Panel Lab Results  Component Value Date   CHOL 252 (H) 09/05/2015   HDL 44 09/05/2015   LDLCALC 164 (H) 09/05/2015   TRIG 218 (H) 09/05/2015      Wt Readings from Last 3 Encounters:  10/31/15 159 lb (72.1 kg)  09/05/15 158 lb (71.7 kg)  07/09/15 153 lb 12.8 oz (69.8 kg)       ASSESSMENT AND PLAN:  SVT (supraventricular tachycardia) ([place]) - Plan: EKG 12-Lead No significant arrhythmia, no change to her medications  Carotid stenosis, bilateral 40% to 59% disease bilaterally Stressed importance of aggressive cholesterol management  Hypertension, essential, benign Blood pressure is well controlled on today's visit. No changes made to the medications.  Pure hypercholesterolemia Recommended she start lovastatin 20 mg every other day then start zetia 10 mg daily. If tolerated, would increase lovastatin to 20 mgrams daily   Total encounter time more than 25 minutes  Greater than 50% was spent in counseling and  coordination of care with the patient   Disposition:   F/U  12 months   Orders Placed This Encounter  Procedures  . EKG 12-Lead     Signed, Esmond Plants, M.D., Ph.D. 10/31/2015  [place], [place]

## 2015-10-31 NOTE — Patient Instructions (Addendum)
Medication Instructions:   Please try lovastatin every other day to start For a few weeks  Then start the zetia one a day  Then after a few weeks, Consider increasing the lovastatin up to everyday  Labwork:  No new labs needed  Testing/Procedures:  No further testing at this time   Follow-Up: It was a pleasure seeing you in the office today. Please call us if you have new issues that need to be addressed before your next appt.  [phone]  Your physician wants you to follow-up in: 12 months.  You will receive a reminder letter in the mail two months in advance. If you don't receive a letter, please call our office to schedule the follow-up appointment.  If you need a refill on your cardiac medications before your next appointment, please call your pharmacy.

## 2015-11-22 ENCOUNTER — Encounter: Payer: Self-pay | Admitting: Emergency Medicine

## 2015-11-22 ENCOUNTER — Ambulatory Visit
Admission: EM | Admit: 2015-11-22 | Discharge: 2015-11-22 | Disposition: A | Discharge status: Home / Self Care | Payer: Medicare Other | Attending: Family Medicine | Admitting: Family Medicine

## 2015-11-22 DIAGNOSIS — N390 Urinary tract infection, site not specified: Secondary | ICD-10-CM

## 2015-11-22 LAB — URINALYSIS COMPLETE WITH MICROSCOPIC (ARMC ONLY): SQUAMOUS EPITHELIAL / LPF: NONE SEEN

## 2015-11-22 MED ORDER — SULFAMETHOXAZOLE-TRIMETHOPRIM 800-160 MG PO TABS: 1.0000 | ORAL_TABLET | Freq: Two times a day (BID) | ORAL | 0 refills | Status: DC

## 2015-11-22 NOTE — ED Provider Notes (Signed)
[facility]    CSN: [id number] Arrival date & time: 11/22/15  1717  First Provider Contact:  None       History   Chief Complaint Chief Complaint  Patient presents with  . Dysuria    HPI Jade Gardner is a 70 y.o. female.   The history is provided by the patient.  Dysuria  Pain quality:  Burning Pain severity:  Mild Onset quality:  Sudden Duration:  2 days Timing:  Constant Progression:  Worsening Chronicity:  New Recent urinary tract infections: no   Relieved by:  Nothing Ineffective treatments:  Phenazopyridine Urinary symptoms: discolored urine and frequent urination   Urinary symptoms: no foul-smelling urine, no hematuria, no hesitancy and no bladder incontinence   Associated symptoms: no abdominal pain, no fever, no flank pain, no nausea and no vomiting   Risk factors: no hx of pyelonephritis, no hx of urolithiasis, no kidney transplant, not pregnant, no recurrent urinary tract infections, no renal cysts, no renal disease, not sexually active, no sexually transmitted infections, no single kidney and no urinary catheter     Past Medical History:  Diagnosis Date  . Diverticulosis   . Hypercalcemia    Secondar to calcium supplements. Normal PTH  . Pneumonia   . SVT (supraventricular tachycardia) ([facility])    history of  . TIA (transient ischemic attack) 2010   Negative carotids.     Patient Active Problem List   Diagnosis Date Noted  . Hay fever 05/29/2015  . NASH (nonalcoholic steatohepatitis) 05/29/2015  . H/O transient cerebral ischemia 05/29/2015  . Personal history of other diseases of the musculoskeletal system and connective tissue 05/29/2015  . Diverticulosis 08/24/2014  . Fibrocystic breast disease 08/24/2014  . Acquired sideroblastic anemia 08/23/2014  . Allergic rhinitis 08/23/2014  . DDD (degenerative disc disease), thoracolumbar 08/23/2014  . Abnormal liver enzymes 08/23/2014  . Hearing loss 08/23/2014  . Insomnia 08/23/2014    . Lumbago 08/23/2014  . Osteopenia 08/23/2014  . Overweight (BMI 25.0-29.9) 08/23/2014  . Pre-diabetes 08/23/2014  . Obstructive sleep apnea 08/23/2014  . Varicose vein 08/23/2014  . B12 deficiency 08/23/2014  . Vitamin D deficiency 08/23/2014  . Malaise and fatigue 09/15/2013  . Carotid stenosis 09/15/2013  . Hypertension, essential, benign 05/26/2011  . SVT (supraventricular tachycardia) ([place]) 05/26/2011  . Menopause 01/02/2011  . Nonalcoholic steatohepatitis ([facility]) 05/20/2009  . Hypercholesterolemia without hypertriglyceridemia 05/20/2009  . Pure hypercholesterolemia 05/20/2009  . Diaphragmatic hernia 08/10/2008  . Cardiac conduction disorder 08/10/2008  . Benign essential HTN 08/10/2008  . GERD (gastroesophageal reflux disease) 08/10/2008    Past Surgical History:  Procedure Laterality Date  . APPENDECTOMY  1954  . CARDIAC CATHETERIZATION  [id number]  . CHOLECYSTECTOMY  1981    OB History    No data available       Home Medications    Prior to Admission medications   Medication Sig Start Date End Date Taking? Authorizing Provider  albuterol (PROVENTIL HFA;VENTOLIN HFA) 108 (90 Base) MCG/ACT inhaler Inhale 1-2 puffs into the lungs every 6 (six) hours as needed for wheezing or shortness of breath. 04/21/15   Jan Fireman, PA-C  aspirin 81 MG tablet Take 81 mg by mouth daily.    Historical Provider, MD  budesonide-formoterol (SYMBICORT) 160-4.5 MCG/ACT inhaler Inhale 2 puffs into the lungs 2 (two) times daily. 04/30/15 10/31/15  Birdie Sons, MD  Calcium Carbonate-Vitamin D (CALTRATE 600+D PO) Take by mouth daily.     Historical Provider, MD  Cholecalciferol (VITAMIN D) 2000  UNITS tablet Take 2,000 Units by mouth daily.    Historical Provider, MD  clopidogrel (PLAVIX) 75 MG tablet TAKE (1) TABLET BY MOUTH EVERY DAY WITH BREAKFAST 07/23/14   Minna Merritts, MD  cyanocobalamin (,VITAMIN B-12,) 1000 MCG/ML injection INJECT 1 ML INTRAMUSCULARLY MONTHLY. 02/27/15   Birdie Sons, MD  cyclobenzaprine (FLEXERIL) 10 MG tablet Take 0.5-1 tablets (5-10 mg total) by mouth at bedtime as needed for muscle spasms. 12/28/14   Birdie Sons, MD  ezetimibe (ZETIA) 10 MG tablet Take 1 tablet (10 mg total) by mouth daily. 10/31/15   Minna Merritts, MD  fluticasone (FLONASE) 50 MCG/ACT nasal spray Place 2 sprays into both nostrils daily. 04/21/15   Jan Fireman, PA-C  ibandronate (BONIVA) 150 MG tablet TAKE 1 TABLET BY MOUTH MONTHLY- DRINK WITH FULL GLASS OF WATER, ON AN EMPTY STOMACH AND WITH NO OTHER FOOD/DRINK/MEDICATION 09/12/15   Birdie Sons, MD  ibuprofen (ADVIL,MOTRIN) 200 MG tablet Take 200 mg by mouth every 6 (six) hours as needed.    Historical Provider, MD  loratadine (CLARITIN) 10 MG tablet Take 10 mg by mouth daily.    Historical Provider, MD  lovastatin (MEVACOR) 20 MG tablet Take 1 tablet (20 mg total) by mouth at bedtime. 10/31/15   Minna Merritts, MD  metoprolol succinate (TOPROL-XL) 50 MG 24 hr tablet TAKE ONE (1) TABLET BY MOUTH ONCE DAILY 11/26/14   Minna Merritts, MD  ondansetron (ZOFRAN ODT) 4 MG disintegrating tablet Take 1 tablet (4 mg total) by mouth every 6 (six) hours as needed for nausea or vomiting. 07/08/15   Delman Kitten, MD  pantoprazole (PROTONIX) 40 MG tablet Take 1 tablet (40 mg total) by mouth daily. 03/12/15   Birdie Sons, MD  ranitidine (ZANTAC) 150 MG capsule Take 1 capsule (150 mg total) by mouth 3 (three) times daily. With Pepto-Bismol for 3-5 days 07/04/15   Frederich Cha, MD  sulfamethoxazole-trimethoprim (BACTRIM DS,SEPTRA DS) 800-160 MG tablet Take 1 tablet by mouth 2 (two) times daily. 11/22/15   Norval Gable, MD  vitamin C (ASCORBIC ACID) 500 MG tablet Take 500 mg by mouth daily.    Historical Provider, MD  vitamin E 1000 UNIT capsule Take 1,000 Units by mouth daily.    Historical Provider, MD    Family History Family History  Problem Relation Age of Onset  . Heart disease Father   . Dementia Father   . Heart disease Mother   .  Heart disease Brother   . Stroke      Social History Social History  Substance Use Topics  . Smoking status: Never Smoker  . Smokeless tobacco: Never Used  . Alcohol use No     Allergies   Fish oil and Simvastatin   Review of Systems Review of Systems  Constitutional: Negative for fever.  Gastrointestinal: Negative for abdominal pain, nausea and vomiting.  Genitourinary: Positive for dysuria. Negative for flank pain.     Physical Exam Triage Vital Signs ED Triage Vitals  Enc Vitals Group     BP 11/22/15 1801 (!) 190/79     Pulse Rate 11/22/15 1801 66     Resp 11/22/15 1801 16     Temp 11/22/15 1801 97.7 F (36.5 C)     Temp Source 11/22/15 1801 Tympanic     SpO2 11/22/15 1801 97 %     Weight 11/22/15 1801 158 lb (71.7 kg)     Height 11/22/15 1801 5' 2.5" (1.588 m)  Head Circumference --      Peak Flow --      Pain Score 11/22/15 1803 8     Pain Loc --      Pain Edu? --      Excl. in [place]? --    No data found.   Updated Vital Signs BP (!) 177/76   Pulse 66   Temp 97.7 F (36.5 C) (Tympanic)   Resp 16   Ht 5' 2.5" (1.588 m)   Wt 158 lb (71.7 kg)   SpO2 97%   BMI 28.44 kg/m   Visual Acuity Right Eye Distance:   Left Eye Distance:   Bilateral Distance:    Right Eye Near:   Left Eye Near:    Bilateral Near:     Physical Exam  Constitutional: She appears well-developed and well-nourished. No distress.  Abdominal: Soft. Bowel sounds are normal. She exhibits no distension and no mass. There is no tenderness. There is no rebound and no guarding.  Skin: She is not diaphoretic.  Nursing note and vitals reviewed.    UC Treatments / Results  Labs (all labs ordered are listed, but only abnormal results are displayed) Labs Reviewed  URINALYSIS COMPLETEWITH MICROSCOPIC ([place]) - Abnormal; Notable for the following:       Result Value   Color, Urine ORANGE (*)    APPearance CLOUDY (*)    Glucose, UA   (*)    Value: TEST NOT REPORTED DUE TO  COLOR INTERFERENCE OF URINE PIGMENT   Bilirubin Urine   (*)    Value: TEST NOT REPORTED DUE TO COLOR INTERFERENCE OF URINE PIGMENT   Ketones, ur   (*)    Value: TEST NOT REPORTED DUE TO COLOR INTERFERENCE OF URINE PIGMENT   Hgb urine dipstick   (*)    Value: TEST NOT REPORTED DUE TO COLOR INTERFERENCE OF URINE PIGMENT   Protein, ur   (*)    Value: TEST NOT REPORTED DUE TO COLOR INTERFERENCE OF URINE PIGMENT   Nitrite   (*)    Value: TEST NOT REPORTED DUE TO COLOR INTERFERENCE OF URINE PIGMENT   Leukocytes, UA   (*)    Value: TEST NOT REPORTED DUE TO COLOR INTERFERENCE OF URINE PIGMENT   Bacteria, UA MANY (*)    All other components within normal limits  URINE CULTURE    EKG  EKG Interpretation None       Radiology No results found.  Procedures Procedures (including critical care time)  Medications Ordered in UC Medications - No data to display   Initial Impression / Assessment and Plan / UC Course  I have reviewed the triage vital signs and the nursing notes.  Pertinent labs & imaging results that were available during my care of the patient were reviewed by me and considered in my medical decision making (see chart for details).  Clinical Course      Final Clinical Impressions(s) / UC Diagnoses   Final diagnoses:  UTI (lower urinary tract infection)    New Prescriptions Discharge Medication List as of 11/22/2015  6:26 PM    START taking these medications   Details  sulfamethoxazole-trimethoprim (BACTRIM DS,SEPTRA DS) 800-160 MG tablet Take 1 tablet by mouth 2 (two) times daily., Starting Fri 11/22/2015, Normal       1. Lab results and diagnosis reviewed with patient 2. rx as per orders above; reviewed possible side effects, interactions, risks and benefits  3. Recommend supportive treatment with increased water 4. Follow-up prn  if symptoms worsen or don't improve   Norval Gable, MD 11/22/15 1836

## 2015-11-22 NOTE — ED Triage Notes (Signed)
Patient c/o burning when urinating and urinary frequency and urgency that started on Wed.

## 2015-11-25 LAB — URINE CULTURE

## 2015-11-28 ENCOUNTER — Encounter: Payer: Self-pay | Admitting: Family Medicine

## 2015-11-28 ENCOUNTER — Ambulatory Visit (INDEPENDENT_AMBULATORY_CARE_PROVIDER_SITE_OTHER): Payer: Medicare Other | Admitting: Family Medicine

## 2015-11-28 VITALS — BP 122/78 | HR 61 | Temp 97.9°F | Resp 14 | Wt 157.2 lb

## 2015-11-28 DIAGNOSIS — I868 Varicose veins of other specified sites: Secondary | ICD-10-CM

## 2015-11-28 DIAGNOSIS — I8390 Asymptomatic varicose veins of unspecified lower extremity: Secondary | ICD-10-CM

## 2015-11-28 DIAGNOSIS — N3000 Acute cystitis without hematuria: Secondary | ICD-10-CM

## 2015-11-28 DIAGNOSIS — R252 Cramp and spasm: Secondary | ICD-10-CM

## 2015-11-28 DIAGNOSIS — E785 Hyperlipidemia, unspecified: Secondary | ICD-10-CM | POA: Insufficient documentation

## 2015-11-28 LAB — POCT URINALYSIS DIPSTICK
BILIRUBIN UA: NEGATIVE
Blood, UA: NEGATIVE
GLUCOSE UA: NEGATIVE
Ketones, UA: NEGATIVE
LEUKOCYTES UA: NEGATIVE
NITRITE UA: NEGATIVE
Protein, UA: NEGATIVE
Spec Grav, UA: 1.01
UROBILINOGEN UA: 0.2
pH, UA: 6

## 2015-11-28 NOTE — Progress Notes (Signed)
Patient: Jade Gardner Female    DOB: 10/29/1945   70 y.o.   MRN: [id number] Visit Date: 11/28/2015  Today's Provider: Vernie Murders, PA   Chief Complaint  Patient presents with  . Leg Pain   Subjective:    Leg Pain   The incident occurred more than 1 week ago. There was no injury mechanism. The pain is present in the left leg and right leg. The quality of the pain is described as cramping. The pain has been fluctuating since onset. Associated symptoms comments: Discomfort with weight bearing .  Patient reports cramps are worse at night, and awakens her during the night. Patient states right leg is more painful than left leg.   Past Medical History:  Diagnosis Date  . Diverticulosis   . Hypercalcemia    Secondar to calcium supplements. Normal PTH  . Pneumonia   . SVT (supraventricular tachycardia) (HCC)    history of  . TIA (transient ischemic attack) 2010   Negative carotids.    Past Surgical History:  Procedure Laterality Date  . APPENDECTOMY  1954  . CARDIAC CATHETERIZATION  [id number]  . CHOLECYSTECTOMY  1981   Family History  Problem Relation Age of Onset  . Heart disease Father   . Dementia Father   . Heart disease Mother   . Heart disease Brother   . Stroke     Allergies  Allergen Reactions  . Fish Oil   . Simvastatin     Other reaction(s): Muscle Pain     Previous Medications   ALBUTEROL (PROVENTIL HFA;VENTOLIN HFA) 108 (90 BASE) MCG/ACT INHALER    Inhale 1-2 puffs into the lungs every 6 (six) hours as needed for wheezing or shortness of breath.   ASPIRIN 81 MG TABLET    Take 81 mg by mouth daily.   BUDESONIDE-FORMOTEROL (SYMBICORT) 160-4.5 MCG/ACT INHALER    Inhale 2 puffs into the lungs 2 (two) times daily.   CALCIUM CARBONATE-VITAMIN D (CALTRATE 600+D PO)    Take by mouth daily.    CHOLECALCIFEROL (VITAMIN D) 2000 UNITS TABLET    Take 2,000 Units by mouth daily.   CLOPIDOGREL (PLAVIX) 75 MG TABLET    TAKE (1) TABLET BY MOUTH EVERY DAY WITH  BREAKFAST   CYANOCOBALAMIN (,VITAMIN B-12,) 1000 MCG/ML INJECTION    INJECT 1 ML INTRAMUSCULARLY MONTHLY.   CYCLOBENZAPRINE (FLEXERIL) 10 MG TABLET    Take 0.5-1 tablets (5-10 mg total) by mouth at bedtime as needed for muscle spasms.   EZETIMIBE (ZETIA) 10 MG TABLET    Take 1 tablet (10 mg total) by mouth daily.   FLUTICASONE (FLONASE) 50 MCG/ACT NASAL SPRAY    Place 2 sprays into both nostrils daily.   IBANDRONATE (BONIVA) 150 MG TABLET    TAKE 1 TABLET BY MOUTH MONTHLY- DRINK WITH FULL GLASS OF WATER, ON AN EMPTY STOMACH AND WITH NO OTHER FOOD/DRINK/MEDICATION   IBUPROFEN (ADVIL,MOTRIN) 200 MG TABLET    Take 200 mg by mouth every 6 (six) hours as needed.   LORATADINE (CLARITIN) 10 MG TABLET    Take 10 mg by mouth daily.   LOVASTATIN (MEVACOR) 20 MG TABLET    Take 1 tablet (20 mg total) by mouth at bedtime.   METOPROLOL SUCCINATE (TOPROL-XL) 50 MG 24 HR TABLET    TAKE ONE (1) TABLET BY MOUTH ONCE DAILY   ONDANSETRON (ZOFRAN ODT) 4 MG DISINTEGRATING TABLET    Take 1 tablet (4 mg total) by mouth every 6 (six) hours as needed for nausea or  vomiting.   PANTOPRAZOLE (PROTONIX) 40 MG TABLET    Take 1 tablet (40 mg total) by mouth daily.   RANITIDINE (ZANTAC) 150 MG CAPSULE    Take 1 capsule (150 mg total) by mouth 3 (three) times daily. With Pepto-Bismol for 3-5 days   SULFAMETHOXAZOLE-TRIMETHOPRIM (BACTRIM DS,SEPTRA DS) 800-160 MG TABLET    Take 1 tablet by mouth 2 (two) times daily.   VITAMIN C (ASCORBIC ACID) 500 MG TABLET    Take 500 mg by mouth daily.   VITAMIN E 1000 UNIT CAPSULE    Take 1,000 Units by mouth daily.    Review of Systems  Constitutional: Negative.   Respiratory: Negative.   Cardiovascular: Negative.   Musculoskeletal: Positive for myalgias.       Leg cramps     Social History  Substance Use Topics  . Smoking status: Never Smoker  . Smokeless tobacco: Never Used  . Alcohol use No   Objective:   BP 122/78 (BP Location: Right Arm, Patient Position: Sitting, Cuff Size:  Normal)   Pulse 61   Temp 97.9 F (36.6 C) (Oral)   Resp 14   Wt 157 lb 3.2 oz (71.3 kg)   BMI 28.29 kg/m  Wt Readings from Last 3 Encounters:  11/28/15 157 lb 3.2 oz (71.3 kg)  11/22/15 158 lb (71.7 kg)  10/31/15 159 lb (72.1 kg)    Physical Exam  Constitutional: She is oriented to person, place, and time. She appears well-developed and well-nourished. No distress.  HENT:  Head: Normocephalic and atraumatic.  Right Ear: Hearing normal.  Left Ear: Hearing normal.  Nose: Nose normal.  Eyes: Conjunctivae and lids are normal. Right eye exhibits no discharge. Left eye exhibits no discharge. No scleral icterus.  Cardiovascular: Normal rate and regular rhythm.   Pulmonary/Chest: Effort normal and breath sounds normal. No respiratory distress.  Abdominal: Soft. Bowel sounds are normal.  Musculoskeletal: Normal range of motion.  Good pulses throughout. Some cramping to compress right calf or dorsiflex ankle. No edema or redness.  Neurological: She is alert and oriented to person, place, and time.  Skin: Skin is intact. No lesion and no rash noted.  Psychiatric: She has a normal mood and affect. Her speech is normal and behavior is normal. Thought content normal.      Assessment & Plan:     1. Cramps of right lower extremity Onset over the past week. Primarily occurs at night. No problems with walking for exercise (not like claudication). May use Tonic Water mixed with juice in the evenings (4-6 ounces) to try to prevent cramps. Will check labs for evidence of electrolyte imbalance or infection. May need evaluation of circulation by ABI or vascular referral if worsening or swelling. - CBC with Differential/Platelet - Comprehensive metabolic panel - Magnesium  2. Varicose vein No swelling. May use support hose prn.  3. Acute cystitis without hematuria Normal urinalysis today and has finished the Septra-DS. Some electrolyte imbalances can be a side effect of sulfa drugs. Increase  fluid intake. - POCT urinalysis dipstick

## 2015-11-29 ENCOUNTER — Telehealth: Payer: Self-pay

## 2015-11-29 LAB — CBC WITH DIFFERENTIAL/PLATELET
BASOS ABS: 0 10*3/uL (ref 0.0–0.2)
BASOS: 1 %
EOS (ABSOLUTE): 0.2 10*3/uL (ref 0.0–0.4)
Eos: 4 %
Hematocrit: 42.5 % (ref 34.0–46.6)
Hemoglobin: 13.9 g/dL (ref 11.1–15.9)
IMMATURE GRANULOCYTES: 0 %
Immature Grans (Abs): 0 10*3/uL (ref 0.0–0.1)
Lymphocytes Absolute: 2.9 10*3/uL (ref 0.7–3.1)
Lymphs: 47 %
MCH: 28.7 pg (ref 26.6–33.0)
MCHC: 32.7 g/dL (ref 31.5–35.7)
MCV: 88 fL (ref 79–97)
MONOS ABS: 0.4 10*3/uL (ref 0.1–0.9)
Monocytes: 7 %
NEUTROS PCT: 41 %
Neutrophils Absolute: 2.5 10*3/uL (ref 1.4–7.0)
PLATELETS: 281 10*3/uL (ref 150–379)
RBC: 4.84 x10E6/uL (ref 3.77–5.28)
RDW: 14 % (ref 12.3–15.4)
WBC: 6.1 10*3/uL (ref 3.4–10.8)

## 2015-11-29 LAB — COMPREHENSIVE METABOLIC PANEL
A/G RATIO: 1.7 (ref 1.2–2.2)
ALT: 24 IU/L (ref 0–32)
AST: 35 IU/L (ref 0–40)
Albumin: 4.5 g/dL (ref 3.5–4.8)
Alkaline Phosphatase: 58 IU/L (ref 39–117)
BUN/Creatinine Ratio: 10 — ABNORMAL LOW (ref 12–28)
BUN: 13 mg/dL (ref 8–27)
Bilirubin Total: 0.6 mg/dL (ref 0.0–1.2)
CALCIUM: 10.6 mg/dL — AB (ref 8.7–10.3)
CHLORIDE: 99 mmol/L (ref 96–106)
CO2: 23 mmol/L (ref 18–29)
Creatinine, Ser: 1.24 mg/dL — ABNORMAL HIGH (ref 0.57–1.00)
GFR calc Af Amer: 50 mL/min/{1.73_m2} — ABNORMAL LOW (ref >59)
GFR, EST NON AFRICAN AMERICAN: 44 mL/min/{1.73_m2} — AB (ref >59)
Globulin, Total: 2.7 g/dL (ref 1.5–4.5)
Glucose: 96 mg/dL (ref 65–99)
Potassium: 5.4 mmol/L — ABNORMAL HIGH (ref 3.5–5.2)
Sodium: 139 mmol/L (ref 134–144)
Total Protein: 7.2 g/dL (ref 6.0–8.5)

## 2015-11-29 LAB — MAGNESIUM: Magnesium: 2.3 mg/dL (ref 1.6–2.3)

## 2015-11-29 NOTE — Telephone Encounter (Signed)
Patient has been advised. KW 

## 2015-11-29 NOTE — Telephone Encounter (Signed)
-----   Message from Margo Common, [place] sent at 11/29/2015 11:45 AM EDT ----- All tests normal except potassium and calcium a little high. Creatinine elevated but better than in May 2017. Need extra fluids and stop the Caltrate until labs rechecked in 3-4 weeks. If cramps worsening or becoming more frequent, will need to have circulation checked by vascular surgeon.

## 2016-02-17 ENCOUNTER — Telehealth: Payer: Self-pay | Admitting: Family Medicine

## 2016-02-17 MED ORDER — OSELTAMIVIR PHOSPHATE 75 MG PO CAPS: 75.0000 mg | ORAL_CAPSULE | Freq: Every day | ORAL | 0 refills | Status: DC

## 2016-02-17 NOTE — Telephone Encounter (Signed)
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 Pt stated she was advised to call to see if she needed to come in or take anything to try to prevent her from getting the flu since she keeps him and was with him yesterday. [facility] [place]. Please advise. Thanks TNP

## 2016-02-17 NOTE — Telephone Encounter (Signed)
Left patient a voicemail advising her that the RX has been sent to pharmacy.

## 2016-02-17 NOTE — Telephone Encounter (Signed)
Will make Tamiflu 75 mg 1 capsule daily #10 available as prevention. Sent to [facility].

## 2016-03-11 ENCOUNTER — Other Ambulatory Visit: Payer: Self-pay | Admitting: Family Medicine

## 2016-03-11 NOTE — Telephone Encounter (Signed)
Pt contacted office for refill request on the following medications: cyclobenzaprine (FLEXERIL) 10 MG tablet.  [facility],  CB#[phone]/MW

## 2016-03-12 NOTE — Telephone Encounter (Signed)
Advise patient filled out paperwork [facility] for refill of Cyclobenzaprine on 03-10-16 and faxed back to them.

## 2016-03-12 NOTE — Telephone Encounter (Signed)
Patient advised.

## 2016-03-26 ENCOUNTER — Telehealth: Payer: Self-pay | Admitting: Family Medicine

## 2016-03-26 NOTE — Telephone Encounter (Signed)
Called Pt to schedule AWV with [facility] - knb

## 2016-04-27 ENCOUNTER — Telehealth: Payer: Self-pay

## 2016-04-27 ENCOUNTER — Ambulatory Visit (INDEPENDENT_AMBULATORY_CARE_PROVIDER_SITE_OTHER): Payer: Medicare Other | Admitting: Family Medicine

## 2016-04-27 ENCOUNTER — Other Ambulatory Visit: Payer: Self-pay

## 2016-04-27 ENCOUNTER — Encounter: Payer: Self-pay | Admitting: Family Medicine

## 2016-04-27 VITALS — BP 158/104 | HR 70 | Temp 97.6°F | Resp 16 | Wt 162.6 lb

## 2016-04-27 DIAGNOSIS — I10 Essential (primary) hypertension: Secondary | ICD-10-CM | POA: Diagnosis not present

## 2016-04-27 DIAGNOSIS — I471 Supraventricular tachycardia, unspecified: Secondary | ICD-10-CM

## 2016-04-27 DIAGNOSIS — R51 Headache: Secondary | ICD-10-CM

## 2016-04-27 DIAGNOSIS — R519 Headache, unspecified: Secondary | ICD-10-CM

## 2016-04-27 DIAGNOSIS — R079 Chest pain, unspecified: Secondary | ICD-10-CM

## 2016-04-27 NOTE — Progress Notes (Signed)
Patient: Jade Gardner Female    DOB: May 12, 1945   71 y.o.   MRN: [id number] Visit Date: 04/27/2016  Today's Provider: Vernie Murders, PA   Chief Complaint  Patient presents with  . Hypertension  . Chest Pain  . Headache  . Dizziness  . Fatigue   Subjective:    Hypertension  This is a chronic problem. Associated symptoms include chest pain, headaches and malaise/fatigue. Risk factors for coronary artery disease include dyslipidemia. Treatments tried: currently taking Metoprolol 50 mg. There are no compliance problems.   Chest Pain   This is a new problem. Episode onset: Friday. The onset quality is sudden. The problem occurs 2 to 4 times per day. The quality of the pain is described as sharp. The pain radiates to the left arm. Associated symptoms include dizziness, headaches, malaise/fatigue and weakness. She has tried nothing for the symptoms.  Her past medical history is significant for hypertension.  Headache   This is a new problem. Episode onset: Friday. The quality of the pain is described as aching. Associated symptoms include dizziness and weakness. Her past medical history is significant for hypertension.  Dizziness  This is a new problem. The current episode started in the past 7 days. Associated symptoms include chest pain, fatigue, headaches and weakness. She has tried nothing for the symptoms.   Past Medical History:  Diagnosis Date  . Diverticulosis   . Hypercalcemia    Secondar to calcium supplements. Normal PTH  . Pneumonia   . SVT (supraventricular tachycardia) (HCC)    history of  . TIA (transient ischemic attack) 2010   Negative carotids.    Past Surgical History:  Procedure Laterality Date  . APPENDECTOMY  1954  . CARDIAC CATHETERIZATION  [id number]  . CHOLECYSTECTOMY  1981   Family History  Problem Relation Age of Onset  . Heart disease Father   . Dementia Father   . Heart disease Mother   . Heart disease Brother   . Stroke     Allergies    Allergen Reactions  . Fish Oil   . Lovastatin     Muscle aches   . Simvastatin     Other reaction(s): Muscle Pain     Previous Medications   ALBUTEROL (PROVENTIL HFA;VENTOLIN HFA) 108 (90 BASE) MCG/ACT INHALER    Inhale 1-2 puffs into the lungs every 6 (six) hours as needed for wheezing or shortness of breath.   ASPIRIN 81 MG TABLET    Take 81 mg by mouth daily.   BUDESONIDE-FORMOTEROL (SYMBICORT) 160-4.5 MCG/ACT INHALER    Inhale 2 puffs into the lungs 2 (two) times daily.   CALCIUM CARBONATE-VITAMIN D (CALTRATE 600+D PO)    Take by mouth daily.    CHOLECALCIFEROL (VITAMIN D) 2000 UNITS TABLET    Take 2,000 Units by mouth daily.   CLOPIDOGREL (PLAVIX) 75 MG TABLET    TAKE (1) TABLET BY MOUTH EVERY DAY WITH BREAKFAST   CYANOCOBALAMIN (,VITAMIN B-12,) 1000 MCG/ML INJECTION    INJECT 1 ML INTRAMUSCULARLY MONTHLY.   CYCLOBENZAPRINE (FLEXERIL) 10 MG TABLET    Take 0.5-1 tablets (5-10 mg total) by mouth at bedtime as needed for muscle spasms.   EZETIMIBE (ZETIA) 10 MG TABLET    Take 1 tablet (10 mg total) by mouth daily.   FLUTICASONE (FLONASE) 50 MCG/ACT NASAL SPRAY    Place 2 sprays into both nostrils daily.   IBANDRONATE (BONIVA) 150 MG TABLET    TAKE 1 TABLET BY MOUTH MONTHLY- DRINK WITH FULL GLASS  OF WATER, ON AN EMPTY STOMACH AND WITH NO OTHER FOOD/DRINK/MEDICATION   IBUPROFEN (ADVIL,MOTRIN) 200 MG TABLET    Take 200 mg by mouth every 6 (six) hours as needed.   LORATADINE (CLARITIN) 10 MG TABLET    Take 10 mg by mouth daily.   METOPROLOL SUCCINATE (TOPROL-XL) 50 MG 24 HR TABLET    TAKE ONE (1) TABLET BY MOUTH ONCE DAILY   ONDANSETRON (ZOFRAN ODT) 4 MG DISINTEGRATING TABLET    Take 1 tablet (4 mg total) by mouth every 6 (six) hours as needed for nausea or vomiting.   PANTOPRAZOLE (PROTONIX) 40 MG TABLET    Take 1 tablet (40 mg total) by mouth daily.   SULFAMETHOXAZOLE-TRIMETHOPRIM (BACTRIM DS,SEPTRA DS) 800-160 MG TABLET    Take 1 tablet by mouth 2 (two) times daily.   VITAMIN C (ASCORBIC  ACID) 500 MG TABLET    Take 500 mg by mouth daily.   VITAMIN E 1000 UNIT CAPSULE    Take 1,000 Units by mouth daily.    Review of Systems  Constitutional: Positive for fatigue and malaise/fatigue.  Respiratory: Negative.   Cardiovascular: Positive for chest pain.  Neurological: Positive for dizziness, weakness and headaches.    Social History  Substance Use Topics  . Smoking status: Never Smoker  . Smokeless tobacco: Never Used  . Alcohol use No   Objective:   BP (!) 162/100 (BP Location: Right Arm, Patient Position: Sitting, Cuff Size: Normal)   Pulse 70   Temp 97.6 F (36.4 C) (Oral)   Resp 16   Wt 162 lb 9.6 oz (73.8 kg)   SpO2 98%   BMI 29.27 kg/m   Physical Exam  Constitutional: She is oriented to person, place, and time. She appears well-developed and well-nourished.  HENT:  Head: Normocephalic.  Right Ear: External ear normal.  Left Ear: External ear normal.  Nose: Nose normal.  Mouth/Throat: Oropharynx is clear and moist.  Eyes: Conjunctivae are normal.  Neck: Neck supple. No JVD present.  Cardiovascular: Normal rate, regular rhythm and normal heart sounds.   Pulmonary/Chest: Effort normal and breath sounds normal.  Abdominal: Soft. Bowel sounds are normal.  Musculoskeletal: Normal range of motion.  Lymphadenopathy:    She has no cervical adenopathy.  Neurological: She is alert and oriented to person, place, and time.  Psychiatric: She has a normal mood and affect. Her behavior is normal. Thought content normal.      Assessment & Plan:     1. Chest pain, unspecified type Onset of a sharp pain in the left upper chest and occasionally into the left arm since 04-24-16. No dyspnea or significant dyspepsia. No sweats but feeling fatigued. No nausea. Not always associated with physical activities but does improve with rest. Is not having chest discomfort at the present. Has a history of SVT with 40% carotid stenosis. 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 Did not want to go to the ER and presents for evaluation in the office. EKG shows sinus rhythm with voltage criteria for LVH and associated nonspecific ST depression. No arrhythmias or peripheral edema. Continue Plavix, ASA and Metoprolol Succinate. History of normal echocardiogram 04-19-13 and rare short runs of SVT on Holter 04-20-13 by Dr. Rockey Situ. Carotid US on 08-27-14 by Dr. Rockey Situ showed 40% stenosis both sides and advised to recheck 2018. Recommend she go home to rest, continue ASA with Plavix and increase Metoprolol Succinate to 75 mg qd. Schedule follow up with Dr. Rockey Situ. Will get  CBC, CMP and Troponin level. Advised family to take her to the ER if pain recurs and persists. - EKG 12-Lead - CBC with Differential/Platelet - Comprehensive metabolic panel - Troponin I - Ambulatory referral to Cardiology  2. Nonintractable headache, unspecified chronicity pattern, unspecified headache type Persistent headache and fatigue over the past 3-4 days. Minimal relief from Tylenol. May use Percogesic and will get CBC with CMP to evaluate kidney and liver function. Recheck pending reports. - CBC with Differential/Platelet - Comprehensive metabolic panel  3. Benign essential HTN BP high over the past 3-4 days. Recommend she increase Metoprolol Succinate to 75 mg qd and restrict sodium in diet. Will check CBC and CMP. Recheck pending lab reports. - CBC with Differential/Platelet - Comprehensive metabolic panel  4. SVT (supraventricular tachycardia) (HCC) No palpitations or flutters recently.

## 2016-04-27 NOTE — Telephone Encounter (Signed)
Pt called requesting an appointment. She states she has checked her BP and it read 160/100 and 155/100. Pt is c/o H/A. Pt reports she is having headache. When asking if pt is having any other sx, pt admits to having left sided chest pain and arm pain intermittently for the last 2 days, and some SOB (after walking 1-2 miles this morning; SOB is NOT while resting). Advised pt to go to ED, but she refused due to germs. At that point I offered pt the earliest appointment available, which was 2:30. Again pt refused because she needs to pick up her grandson from school. Grandson's parents are out of town and she is the only one available to pick him up. I scheduled appointment for 3:30. Advised pt to go ED if sx worsen. Pt verbally agreed to ED if she feels like she needs to. Renaldo Fiddler, CMA

## 2016-04-28 ENCOUNTER — Telehealth: Payer: Self-pay | Admitting: Family Medicine

## 2016-04-28 ENCOUNTER — Telehealth: Payer: Self-pay | Admitting: Cardiovascular Disease

## 2016-04-28 LAB — COMPREHENSIVE METABOLIC PANEL
ALT: 26 IU/L (ref 0–32)
AST: 25 IU/L (ref 0–40)
Albumin/Globulin Ratio: 1.6 (ref 1.2–2.2)
Albumin: 4.8 g/dL (ref 3.5–4.8)
Alkaline Phosphatase: 70 IU/L (ref 39–117)
BUN/Creatinine Ratio: 11 — ABNORMAL LOW (ref 12–28)
BUN: 9 mg/dL (ref 8–27)
Bilirubin Total: 0.6 mg/dL (ref 0.0–1.2)
CALCIUM: 10.2 mg/dL (ref 8.7–10.3)
CO2: 24 mmol/L (ref 18–29)
CREATININE: 0.81 mg/dL (ref 0.57–1.00)
Chloride: 101 mmol/L (ref 96–106)
GFR calc Af Amer: 84 mL/min/{1.73_m2} (ref >59)
GFR, EST NON AFRICAN AMERICAN: 73 mL/min/{1.73_m2} (ref >59)
Globulin, Total: 3 g/dL (ref 1.5–4.5)
Glucose: 99 mg/dL (ref 65–99)
POTASSIUM: 4.3 mmol/L (ref 3.5–5.2)
SODIUM: 140 mmol/L (ref 134–144)
Total Protein: 7.8 g/dL (ref 6.0–8.5)

## 2016-04-28 LAB — CBC WITH DIFFERENTIAL/PLATELET
BASOS ABS: 0 10*3/uL (ref 0.0–0.2)
Basos: 0 %
EOS (ABSOLUTE): 0.4 10*3/uL (ref 0.0–0.4)
Eos: 4 %
Hematocrit: 43.4 % (ref 34.0–46.6)
Hemoglobin: 14.1 g/dL (ref 11.1–15.9)
IMMATURE GRANULOCYTES: 0 %
Immature Grans (Abs): 0 10*3/uL (ref 0.0–0.1)
LYMPHS ABS: 3.2 10*3/uL — AB (ref 0.7–3.1)
Lymphs: 34 %
MCH: 28.6 pg (ref 26.6–33.0)
MCHC: 32.5 g/dL (ref 31.5–35.7)
MCV: 88 fL (ref 79–97)
MONOS ABS: 0.6 10*3/uL (ref 0.1–0.9)
Monocytes: 7 %
NEUTROS PCT: 55 %
Neutrophils Absolute: 5.1 10*3/uL (ref 1.4–7.0)
PLATELETS: 242 10*3/uL (ref 150–379)
RBC: 4.93 x10E6/uL (ref 3.77–5.28)
RDW: 13.6 % (ref 12.3–15.4)
WBC: 9.3 10*3/uL (ref 3.4–10.8)

## 2016-04-28 LAB — TROPONIN I: Troponin I: <0.01 ng/mL (ref 0.00–0.04)

## 2016-04-28 NOTE — Progress Notes (Signed)
Will wait on the final results before calling patient.

## 2016-04-28 NOTE — Telephone Encounter (Signed)
Patient advised of lab results thus far. Results in labs.

## 2016-04-28 NOTE — Telephone Encounter (Signed)
Pt requesting lab results.

## 2016-04-28 NOTE — Telephone Encounter (Signed)
-----   Message from Margo Common, [place] sent at 04/28/2016  2:53 PM EST ----- So far, blood tests are normal. Awaiting final report of the Troponin. Still feel it would be good for her to have Dr. Rockey Situ check this out.

## 2016-04-28 NOTE — Telephone Encounter (Signed)
Per [facility] referral patient needs to be seen for cp.  Patient stated she has been having some cp headaches and elevated bp.  She says Jade Gardner made some bp medication changes.    Scheduled next available appt with Gollan 05-04-16 at 8 : 20 am.  Added to waitlist gollan ryan and chris berge   Patient thinks she should maybe be seen sooner.  Please call.

## 2016-04-28 NOTE — Telephone Encounter (Signed)
Patient advised. She has a appointment with Dr. Rockey Situ on Monday.

## 2016-04-28 NOTE — Telephone Encounter (Signed)
Christell Faith, PA reviewed pt's chart and is agreeable to pt being seen 05/04/16. Pt's PCP advised her to proceed to ED if sx become emergent.  Pt added to wait list in the event of a cancellation.

## 2016-04-30 ENCOUNTER — Ambulatory Visit (INDEPENDENT_AMBULATORY_CARE_PROVIDER_SITE_OTHER): Payer: Medicare Other | Admitting: Cardiovascular Disease

## 2016-04-30 ENCOUNTER — Encounter: Payer: Self-pay | Admitting: Cardiovascular Disease

## 2016-04-30 VITALS — BP 156/98 | HR 66 | Ht 62.5 in | Wt 160.2 lb

## 2016-04-30 DIAGNOSIS — E782 Mixed hyperlipidemia: Secondary | ICD-10-CM

## 2016-04-30 DIAGNOSIS — R5383 Other fatigue: Secondary | ICD-10-CM

## 2016-04-30 DIAGNOSIS — R079 Chest pain, unspecified: Secondary | ICD-10-CM | POA: Diagnosis not present

## 2016-04-30 DIAGNOSIS — Z8673 Personal history of transient ischemic attack (TIA), and cerebral infarction without residual deficits: Secondary | ICD-10-CM

## 2016-04-30 DIAGNOSIS — R7303 Prediabetes: Secondary | ICD-10-CM

## 2016-04-30 DIAGNOSIS — I10 Essential (primary) hypertension: Secondary | ICD-10-CM | POA: Diagnosis not present

## 2016-04-30 DIAGNOSIS — I779 Disorder of arteries and arterioles, unspecified: Secondary | ICD-10-CM | POA: Diagnosis not present

## 2016-04-30 DIAGNOSIS — R5381 Other malaise: Secondary | ICD-10-CM

## 2016-04-30 DIAGNOSIS — I471 Supraventricular tachycardia: Secondary | ICD-10-CM

## 2016-04-30 DIAGNOSIS — K7581 Nonalcoholic steatohepatitis (NASH): Secondary | ICD-10-CM | POA: Diagnosis not present

## 2016-04-30 DIAGNOSIS — R42 Dizziness and giddiness: Secondary | ICD-10-CM

## 2016-04-30 DIAGNOSIS — I739 Peripheral vascular disease, unspecified: Secondary | ICD-10-CM

## 2016-04-30 MED ORDER — EZETIMIBE 10 MG PO TABS: 10.0000 mg | ORAL_TABLET | Freq: Every day | ORAL | 3 refills | Status: DC

## 2016-04-30 MED ORDER — LOVASTATIN 20 MG PO TABS: 20.0000 mg | ORAL_TABLET | Freq: Every day | ORAL | 3 refills | Status: DC

## 2016-04-30 MED ORDER — AMLODIPINE BESYLATE 10 MG PO TABS: 10.0000 mg | ORAL_TABLET | Freq: Every day | ORAL | 11 refills | Status: DC

## 2016-04-30 NOTE — Patient Instructions (Addendum)
Goal blood pressure <140 on the top <90 on the bottom   Medication Instructions:   Please start 1/2 pill of the amlodipine for blood pressure Continue metoprolol 75 mg daily  For cholesterol Start lovastatin every other day Start zetia  Labwork:  No new labs needed  Testing/Procedures:  We will order a carotid ultrasound for known carotid disease   I recommend watching educational videos on topics of interest to you at:       [email or web address]  Enter code: [email or web address]    Follow-Up: It was a pleasure seeing you in the office today. Please call us if you have new issues that need to be addressed before your next appt.  [phone]  Your physician wants you to follow-up in: 6 months.  You will receive a reminder letter in the mail two months in advance. If you don't receive a letter, please call our office to schedule the follow-up appointment.  If you need a refill on your cardiac medications before your next appointment, please call your pharmacy.

## 2016-04-30 NOTE — Progress Notes (Signed)
Cardiology Office Note  Date:  04/30/2016   ID:  Jade Gardner, DOB January 05, 1946, MRN [id number]  PCP:  Vernie Murders, PA   Chief Complaint  Patient presents with  . other    Early 6 m fu headaches chest pain elevated bp. Pt c/o dizziness at times. Reviewed meds with pt verbally.    HPI:  [name] is a very pleasant 71 year old woman with a history of SVT, TIA in 5176 of uncertain etiology,  history of H/A, hypertension, hyperlipidemia,  reaction to simvastatin/Crestor/Lipitor, [name], cardiac catheterization in 05/17/89 showing no significant coronary artery disease who presents for routine followup of her SVT and high cholesterol, hypertension, PAD 40% bilateral carotid arterial disease Average cholesterol around 250  She reports numerous issues on today's visit She is having high blood pressure. Recently checked by a friend, systolic pressures typically 150 up to 160/100 She is concerned, does not really want to take more medications but does not want to have a TIA or stroke She is open to starting new medications for blood pressure Denies any change in her weight, no dietary changes, no excessive salt Denies having pain  She does report having periodic headaches, wonders if it could be related to her blood pressure  Other issue has had periodic left-sided chest pain radiating to her arm Presenting at rest, not typically associate with exertion Comes on quickly, does not state, sometimes of pain down her left arm Last episode of chest pain was approximately one week ago  On her last clinic visit we recommended she start lovastatin every other day and Zetia She did not pick up these medications   Denies any significant SVT episodes active, works part time  She reports having occasional dizziness, not very often  Orthostatics done in the office today show no significant change in her pressure standing 150 up to 160 over 90s heart rate in the 60s   EKG on today's visit  shows normal sinus rhythm with rate 66 bpm, no significant ST or T-wave changes  Other past medical history Previous TIA on 03/24/2013. She had acute onset of slurred speech, headache, dizziness, blurred vision, numbness on her for head. She went to the emergency room and blood pressure was 200/100. She stayed most of the day and was discharged home. TIA happened while on low-dose aspirin  CT scan of the head showed no stroke MRI of the head showed no acute stroke Lab work is essentially normal including basic metabolic panel and CBC  echocardiogram done that showed no ASD or PFO, otherwise normal study Carotid ultrasound showed bilateral 40-59% carotid arterial disease Holter monitor showed one short run of atrial tachycardia/SVT 7 beats, rare APCs  She had a difficult year 05/17/2012 as her husband passed away last year from a reaction to blood transfusion. She has had some problems adjusting, insomnia, increasing her weight. She has a supportive family and is trying to stay active. For SVT, she has tried Cardizem in the past but had fatigue  Echocardiogram May 2006 showing mild LVH, mild MR, moderate TR, biventricular systolic pressure 46 mmHg with mild pulmonary hypertension  Perfusion stress test January 2006 showing normal ejection fraction, poor exercise tolerance on Bruce protocol for 3 minutes 30 seconds, or Apical septal ischemia  Significant family history with brother with bypass surgery, father with bypass surgery at age 11, mother with stroke and heart attack in her 15s   PMH:   has a past medical history of Diverticulosis; Hypercalcemia; Pneumonia; SVT (supraventricular tachycardia) (  [place]); and TIA (transient ischemic attack) (2010).  PSH:    Past Surgical History:  Procedure Laterality Date  . APPENDECTOMY  1954  . CARDIAC CATHETERIZATION  [id number]  . CHOLECYSTECTOMY  1981    Current Outpatient Prescriptions  Medication Sig Dispense Refill  . albuterol  (PROVENTIL HFA;VENTOLIN HFA) 108 (90 Base) MCG/ACT inhaler Inhale 1-2 puffs into the lungs every 6 (six) hours as needed for wheezing or shortness of breath. 1 Inhaler 0  . aspirin 81 MG tablet Take 81 mg by mouth daily.    . Calcium Carbonate-Vitamin D (CALTRATE 600+D PO) Take by mouth daily.     . Cholecalciferol (VITAMIN D) 2000 UNITS tablet Take 2,000 Units by mouth daily.    . clopidogrel (PLAVIX) 75 MG tablet TAKE (1) TABLET BY MOUTH EVERY DAY WITH BREAKFAST 90 tablet 3  . cyanocobalamin (,VITAMIN B-12,) 1000 MCG/ML injection INJECT 1 ML INTRAMUSCULARLY MONTHLY. 1 mL 0  . cyclobenzaprine (FLEXERIL) 10 MG tablet Take 0.5-1 tablets (5-10 mg total) by mouth at bedtime as needed for muscle spasms. 30 tablet 1  . ezetimibe (ZETIA) 10 MG tablet Take 1 tablet (10 mg total) by mouth daily. 90 tablet 3  . fluticasone (FLONASE) 50 MCG/ACT nasal spray Place 2 sprays into both nostrils daily. 16 g 2  . ibandronate (BONIVA) 150 MG tablet TAKE 1 TABLET BY MOUTH MONTHLY- DRINK WITH FULL GLASS OF WATER, ON AN EMPTY STOMACH AND WITH NO OTHER FOOD/DRINK/MEDICATION 1 tablet 12  . ibuprofen (ADVIL,MOTRIN) 200 MG tablet Take 200 mg by mouth every 6 (six) hours as needed.    . loratadine (CLARITIN) 10 MG tablet Take 10 mg by mouth daily.    . metoprolol succinate (TOPROL-XL) 50 MG 24 hr tablet Take 75 mg by mouth daily. Take with or immediately following a meal.    . ondansetron (ZOFRAN ODT) 4 MG disintegrating tablet Take 1 tablet (4 mg total) by mouth every 6 (six) hours as needed for nausea or vomiting. 20 tablet 0  . pantoprazole (PROTONIX) 40 MG tablet Take 1 tablet (40 mg total) by mouth daily. 90 tablet 3  . vitamin C (ASCORBIC ACID) 500 MG tablet Take 500 mg by mouth daily.    . vitamin E 1000 UNIT capsule Take 1,000 Units by mouth daily.    Marland Kitchen amLODipine (NORVASC) 10 MG tablet Take 1 tablet (10 mg total) by mouth daily. 30 tablet 11  . budesonide-formoterol (SYMBICORT) 160-4.5 MCG/ACT inhaler Inhale 2  puffs into the lungs 2 (two) times daily. 1 Inhaler 0  . lovastatin (MEVACOR) 20 MG tablet Take 1 tablet (20 mg total) by mouth at bedtime. 90 tablet 3   Current Facility-Administered Medications  Medication Dose Route Frequency Provider Last Rate Last Dose  . cyanocobalamin ((VITAMIN B-12)) injection 1,000 mcg  1,000 mcg Intramuscular Q30 days Birdie Sons, MD   1,000 mcg at 08/23/14 1032     Allergies:   Fish oil and Simvastatin   Social History:  The patient  reports that she has never smoked. She has never used smokeless tobacco. She reports that she does not drink alcohol or use drugs.   Family History:   family history includes Dementia in her father; Heart disease in her brother, father, and mother.    Review of Systems: Review of Systems  Constitutional: Negative.   Respiratory: Negative.   Cardiovascular: Positive for chest pain.       Arm pain  Gastrointestinal: Negative.   Musculoskeletal: Negative.   Neurological: Positive for dizziness  and headaches.  Psychiatric/Behavioral: Negative.   All other systems reviewed and are negative.    PHYSICAL EXAM: VS:  BP (!) 156/98 (BP Location: Left Arm, Patient Position: Sitting, Cuff Size: Normal)   Pulse 66   Ht 5' 2.5" (1.588 m)   Wt 160 lb 4 oz (72.7 kg)   BMI 28.84 kg/m  , BMI Body mass index is 28.84 kg/m. GEN: Well nourished, well developed, in no acute distress, obese  HEENT: normal  Neck: no JVD, carotid bruits, or masses Cardiac: RRR; no murmurs, rubs, or gallops,no edema  Respiratory:  clear to auscultation bilaterally, normal work of breathing GI: soft, nontender, nondistended, + BS MS: no deformity or atrophy  Skin: warm and dry, no rash Neuro:  Strength and sensation are intact Psych: euthymic mood, full affect    Recent Labs: 07/08/2015: Hemoglobin 14.1 11/28/2015: Magnesium 2.3 04/27/2016: ALT 26; BUN 9; Creatinine, Ser 0.81; Platelets 242; Potassium 4.3; Sodium 140    Lipid Panel Lab Results   Component Value Date   CHOL 252 (H) 09/05/2015   HDL 44 09/05/2015   LDLCALC 164 (H) 09/05/2015   TRIG 218 (H) 09/05/2015      Wt Readings from Last 3 Encounters:  04/30/16 160 lb 4 oz (72.7 kg)  04/27/16 162 lb 9.6 oz (73.8 kg)  11/28/15 157 lb 3.2 oz (71.3 kg)       ASSESSMENT AND PLAN:  Hypertension, essential, benign - Plan: EKG 12-Lead, VAS US CAROTID Elevated blood pressure on today's visit. Discussed various treatment options for her, types of medications. Recommended she buy blood pressure cuff, discussed when to monitor blood pressure. Recommended she start amlodipine 5 mg daily. If she develops leg swelling recommended she call our office. If blood pressure is not well controlled, recommended she increase up to 10 mg daily Other options for blood pressure include losartan, HCTZ  SVT (supraventricular tachycardia) (HCC) - Plan: EKG 12-Lead, VAS US CAROTID She denies any events concerning for SVT  H/O transient cerebral ischemia - Plan: EKG 12-Lead, VAS US CAROTID No recent neurologic type events concerning for TIA or stroke  Carotid artery disease, unspecified laterality ([place]) - Plan: VAS US CAROTID Long discussion with her, family who presents with her today would like her to have repeat carotid ultrasound. Patient does not want to commit to an ultrasound at this time. We have placed the order, she can schedule this as she wishes Discussed prior carotid ultrasound 40-59% in 2015  Mixed hyperlipidemia Markedly elevated total cholesterol running 250 on a regular basis She did not pick up lovastatin or Zetia on her last clinic visit Discussed with her again, she is willing to try them We also spent time discussing other options such as repath and praluent She may benefit from company assistance  Malaise and fatigue Unable to exclude component of depression Recommended regular exercise program  Nonalcoholic steatohepatitis (NASH) Secondary to obesity, recommended  weight loss, low carbohydrate diet  Pre-diabetes As above recommended low carbohydrate diet and exercise program  Chest pain, unspecified type Long discussion with her concerning her symptoms, atypical in nature. We discussed screening options such as CT coronary calcium scoring Other option would be routine stress testing She has declined both at this time Recommended she call us if she has recurrent symptoms especially with exertion  Dizziness Atypical symptoms, reported having dizziness sitting up but blood pressure was well controlled   Total encounter time more than 45 minutes  Greater than 50% was spent in counseling and coordination  of care with the patient   Disposition:   F/U  6 months   Orders Placed This Encounter  Procedures  . EKG 12-Lead     Signed, Esmond Plants, M.D., Ph.D. 04/30/2016  [place], [place]

## 2016-05-03 ENCOUNTER — Encounter: Payer: Self-pay | Admitting: Gynecology

## 2016-05-03 ENCOUNTER — Ambulatory Visit
Admission: EM | Admit: 2016-05-03 | Discharge: 2016-05-03 | Disposition: A | Discharge status: Home / Self Care | Payer: Medicare Other | Attending: Family Medicine | Admitting: Family Medicine

## 2016-05-03 DIAGNOSIS — J020 Streptococcal pharyngitis: Secondary | ICD-10-CM | POA: Diagnosis not present

## 2016-05-03 LAB — RAPID STREP SCREEN (MED CTR MEBANE ONLY): Streptococcus, Group A Screen (Direct): POSITIVE — AB

## 2016-05-03 MED ORDER — AMOXICILLIN 500 MG PO CAPS: 500.0000 mg | ORAL_CAPSULE | Freq: Two times a day (BID) | ORAL | 0 refills | Status: DC

## 2016-05-03 NOTE — ED Triage Notes (Signed)
Per patient c/o sore sore throat / chills x yesterday.

## 2016-05-03 NOTE — ED Provider Notes (Signed)
[facility]    CSN: [id number] Arrival date & time: 05/03/16  0800   History   Chief Complaint Chief Complaint  Patient presents with  . Sore Throat   HPI  71 year old female presents with sore throat, headache, body aches, chills.  Patient states her symptoms started yesterday. She is most troubled by sore throat. No fever. She took some ibuprofen without second improvement. She's also use some Chloraseptic spray without improvement. 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 No other associated symptoms. No known exacerbating or relieving factors. No other complaints at this time.  Past Medical History:  Diagnosis Date  . Diverticulosis   . Hypercalcemia    Secondar to calcium supplements. Normal PTH  . Pneumonia   . SVT (supraventricular tachycardia) (HCC)    history of  . TIA (transient ischemic attack) 2010   Negative carotids.     Patient Active Problem List   Diagnosis Date Noted  . Hypertension 04/27/2016  . Hyperlipidemia 11/28/2015  . Hay fever 05/29/2015  . NASH (nonalcoholic steatohepatitis) 05/29/2015  . H/O transient cerebral ischemia 05/29/2015  . Personal history of other diseases of the musculoskeletal system and connective tissue 05/29/2015  . Bronchitis, mucopurulent recurrent ([place]) 05/29/2015  . Diverticulosis 08/24/2014  . Fibrocystic breast disease 08/24/2014  . Acquired sideroblastic anemia 08/23/2014  . Allergic rhinitis 08/23/2014  . DDD (degenerative disc disease), thoracolumbar 08/23/2014  . Abnormal liver enzymes 08/23/2014  . Hearing loss 08/23/2014  . Insomnia 08/23/2014  . Osteopenia 08/23/2014  . Overweight (BMI 25.0-29.9) 08/23/2014  . Pre-diabetes 08/23/2014  . Obstructive sleep apnea 08/23/2014  . Varicose vein 08/23/2014  . B12 deficiency 08/23/2014  . Vitamin D deficiency 08/23/2014  . Carotid stenosis 09/15/2013  . Hypertension, essential, benign 05/26/2011  . SVT (supraventricular  tachycardia) ([place]) 05/26/2011  . Nonalcoholic steatohepatitis (NASH) 05/20/2009  . Hypercholesterolemia without hypertriglyceridemia 05/20/2009  . Pure hypercholesterolemia 05/20/2009  . Diaphragmatic hernia 08/10/2008  . Cardiac conduction disorder 08/10/2008  . Benign essential HTN 08/10/2008  . GERD (gastroesophageal reflux disease) 08/10/2008    Past Surgical History:  Procedure Laterality Date  . APPENDECTOMY  1954  . CARDIAC CATHETERIZATION  [id number]  . CHOLECYSTECTOMY  1981    OB History    No data available     Home Medications    Prior to Admission medications   Medication Sig Start Date End Date Taking? Authorizing Provider  albuterol (PROVENTIL HFA;VENTOLIN HFA) 108 (90 Base) MCG/ACT inhaler Inhale 1-2 puffs into the lungs every 6 (six) hours as needed for wheezing or shortness of breath. 04/21/15  Yes Jan Fireman, PA-C  amLODipine (NORVASC) 10 MG tablet Take 1 tablet (10 mg total) by mouth daily. 04/30/16 04/30/17 Yes Minna Merritts, MD  aspirin 81 MG tablet Take 81 mg by mouth daily.   Yes Historical Provider, MD  Calcium Carbonate-Vitamin D (CALTRATE 600+D PO) Take by mouth daily.    Yes Historical Provider, MD  Cholecalciferol (VITAMIN D) 2000 UNITS tablet Take 2,000 Units by mouth daily.   Yes Historical Provider, MD  clopidogrel (PLAVIX) 75 MG tablet TAKE (1) TABLET BY MOUTH EVERY DAY WITH BREAKFAST 07/23/14  Yes Minna Merritts, MD  cyanocobalamin (,VITAMIN B-12,) 1000 MCG/ML injection INJECT 1 ML INTRAMUSCULARLY MONTHLY. 02/27/15  Yes Birdie Sons, MD  cyclobenzaprine (FLEXERIL) 10 MG tablet Take 0.5-1 tablets (5-10 mg total) by mouth at bedtime as needed for muscle spasms. 12/28/14  Yes Birdie Sons, MD  ezetimibe (ZETIA) 10 MG  tablet Take 1 tablet (10 mg total) by mouth daily. 04/30/16  Yes Minna Merritts, MD  fluticasone (FLONASE) 50 MCG/ACT nasal spray Place 2 sprays into both nostrils daily. 04/21/15  Yes Jan Fireman, PA-C  ibandronate (BONIVA) 150 MG  tablet TAKE 1 TABLET BY MOUTH MONTHLY- DRINK WITH FULL GLASS OF WATER, ON AN EMPTY STOMACH AND WITH NO OTHER FOOD/DRINK/MEDICATION 09/12/15  Yes Birdie Sons, MD  ibuprofen (ADVIL,MOTRIN) 200 MG tablet Take 200 mg by mouth every 6 (six) hours as needed.   Yes Historical Provider, MD  loratadine (CLARITIN) 10 MG tablet Take 10 mg by mouth daily.   Yes Historical Provider, MD  lovastatin (MEVACOR) 20 MG tablet Take 1 tablet (20 mg total) by mouth at bedtime. 04/30/16 04/30/17 Yes Minna Merritts, MD  metoprolol succinate (TOPROL-XL) 50 MG 24 hr tablet Take 75 mg by mouth daily. Take with or immediately following a meal.   Yes Historical Provider, MD  ondansetron (ZOFRAN ODT) 4 MG disintegrating tablet Take 1 tablet (4 mg total) by mouth every 6 (six) hours as needed for nausea or vomiting. 07/08/15  Yes Delman Kitten, MD  pantoprazole (PROTONIX) 40 MG tablet Take 1 tablet (40 mg total) by mouth daily. 03/12/15  Yes Birdie Sons, MD  vitamin C (ASCORBIC ACID) 500 MG tablet Take 500 mg by mouth daily.   Yes Historical Provider, MD  vitamin E 1000 UNIT capsule Take 1,000 Units by mouth daily.   Yes Historical Provider, MD  amoxicillin (AMOXIL) 500 MG capsule Take 1 capsule (500 mg total) by mouth 2 (two) times daily. 05/03/16   Coral Spikes, DO  budesonide-formoterol (SYMBICORT) 160-4.5 MCG/ACT inhaler Inhale 2 puffs into the lungs 2 (two) times daily. 04/30/15 10/31/15  Birdie Sons, MD   Family History Family History  Problem Relation Age of Onset  . Heart disease Father   . Dementia Father   . Heart disease Mother   . Heart disease Brother   . Stroke     Social History Social History  Substance Use Topics  . Smoking status: Never Smoker  . Smokeless tobacco: Never Used  . Alcohol use No   Allergies   Fish oil and Simvastatin  Review of Systems Review of Systems  Constitutional: Positive for chills. Negative for fever.  HENT: Positive for sore throat.   Musculoskeletal:       Body aches.     Physical Exam Triage Vital Signs ED Triage Vitals  Enc Vitals Group     BP 05/03/16 0822 115/80     Pulse Rate 05/03/16 0822 60     Resp 05/03/16 0822 16     Temp 05/03/16 0822 97.8 F (36.6 C)     Temp Source 05/03/16 0822 Oral     SpO2 05/03/16 0822 98 %     Weight 05/03/16 0824 160 lb (72.6 kg)     Height --      Head Circumference --      Peak Flow --      Pain Score 05/03/16 0830 3     Pain Loc --      Pain Edu? --      Excl. in [place]? --     Updated Vital Signs BP 115/80 (BP Location: Left Arm)   Pulse 60   Temp 97.8 F (36.6 C) (Oral)   Resp 16   Wt 160 lb (72.6 kg)   SpO2 98%   BMI 28.80 kg/m   Physical Exam  Constitutional: She is oriented to person, place, and time. She appears well-developed. No distress.  HENT:  Mouth/Throat: Oropharynx is clear and moist.  Cardiovascular: Normal rate and regular rhythm.   Pulmonary/Chest: Effort normal and breath sounds normal.  Neurological: She is alert and oriented to person, place, and time.  Psychiatric: She has a normal mood and affect.  Vitals reviewed.  UC Treatments / Results  Labs (all labs ordered are listed, but only abnormal results are displayed) Labs Reviewed  RAPID STREP SCREEN (NOT AT [facility]) - Abnormal; Notable for the following:       Result Value   Streptococcus, Group A Screen (Direct) POSITIVE (*)    All other components within normal limits    EKG  EKG Interpretation None      Radiology No results found.  Procedures Procedures (including critical care time)  Medications Ordered in UC Medications - No data to display   Initial Impression / Assessment and Plan / UC Course  I have reviewed the triage vital signs and the nursing notes.  Pertinent labs & imaging results that were available during my care of the patient were reviewed by me and considered in my medical decision making (see chart for details).    71 year old female presents with a several influenza. Her predominant  symptom is sore throat. Streptest was positive. Treating with amoxicillin.  Final Clinical Impressions(s) / UC Diagnoses   Final diagnoses:  Strep pharyngitis   New Prescriptions Discharge Medication List as of 05/03/2016  9:03 AM    START taking these medications   Details  amoxicillin (AMOXIL) 500 MG capsule Take 1 capsule (500 mg total) by mouth 2 (two) times daily., Starting Sun 05/03/2016, Normal         Coral Spikes, DO 05/03/16 0915

## 2016-05-03 NOTE — Discharge Instructions (Signed)
Antibiotic as prescribed.

## 2016-05-04 ENCOUNTER — Ambulatory Visit: Payer: Medicare Other | Admitting: Cardiovascular Disease

## 2016-05-05 ENCOUNTER — Telehealth: Payer: Self-pay

## 2016-05-05 NOTE — Telephone Encounter (Signed)
Patient advised. Patient verbalized understanding.

## 2016-05-05 NOTE — Telephone Encounter (Signed)
Pt was at [facility] on 05/03/2016, and was diagnosed with strep throat. Was given Amoxicillin, without relief. Pt is c/o aches, chills, nausea, headache. UC note in encounters tab. [facility] [place]. Please advise. Renaldo Fiddler, CMA

## 2016-05-05 NOTE — Telephone Encounter (Signed)
It will take at least 3-4 days for the antibiotic to help this infection. Use saltwater gargles or throat lozenges for discomfort. Recheck in the office if no better with Thursday morning. Recheck sooner if having high fevers or inability to swallow.

## 2016-05-07 ENCOUNTER — Telehealth: Payer: Self-pay | Admitting: Cardiovascular Disease

## 2016-05-07 NOTE — Telephone Encounter (Signed)
Spoke w/ Jade Gardner. Clarified that pt's allergies show that she had muscle aches associated w/ simvastatin and not a true allergy to statins. She will fill pt's new rx for lovastatin.

## 2016-05-07 NOTE — Telephone Encounter (Signed)
PHarmacist states pt has an allergy to statins. Please call to verify of allergy to this. Reference [id number]

## 2016-05-12 ENCOUNTER — Telehealth: Payer: Self-pay | Admitting: Family Medicine

## 2016-05-12 NOTE — Telephone Encounter (Signed)
Called Pt to schedule AWV with [facility] - knb

## 2016-06-16 ENCOUNTER — Other Ambulatory Visit: Payer: Self-pay | Admitting: *Deleted

## 2016-06-16 ENCOUNTER — Telehealth: Payer: Self-pay | Admitting: Cardiovascular Disease

## 2016-06-16 MED ORDER — METOPROLOL SUCCINATE ER 50 MG PO TB24: ORAL_TABLET | ORAL | 3 refills | Status: DC

## 2016-06-16 MED ORDER — METOPROLOL SUCCINATE ER 50 MG PO TB24: 75.0000 mg | ORAL_TABLET | Freq: Every day | ORAL | 3 refills | Status: DC

## 2016-06-16 NOTE — Telephone Encounter (Signed)
°*  STAT* If patient is at the pharmacy, call can be transferred to refill team.   1. Which medications need to be refilled? (please list name of each medication and dose if known)  Metoprolol 75 mg   2. Which pharmacy/location (including street and city if local pharmacy) is medication to be sent to? [facility]   3. Do they need a 30 day or 90 day supply? 90 day

## 2016-06-17 ENCOUNTER — Ambulatory Visit: Payer: Medicare Other

## 2016-06-17 DIAGNOSIS — Z8673 Personal history of transient ischemic attack (TIA), and cerebral infarction without residual deficits: Secondary | ICD-10-CM

## 2016-06-17 DIAGNOSIS — I779 Disorder of arteries and arterioles, unspecified: Secondary | ICD-10-CM

## 2016-06-17 DIAGNOSIS — I739 Peripheral vascular disease, unspecified: Secondary | ICD-10-CM

## 2016-06-17 DIAGNOSIS — I471 Supraventricular tachycardia: Secondary | ICD-10-CM

## 2016-06-17 DIAGNOSIS — I6523 Occlusion and stenosis of bilateral carotid arteries: Secondary | ICD-10-CM | POA: Diagnosis not present

## 2016-06-17 DIAGNOSIS — I10 Essential (primary) hypertension: Secondary | ICD-10-CM

## 2016-06-17 LAB — VAS US CAROTID
LEFT ECA DIAS: -20 cm/s
LEFT VERTEBRAL DIAS: -16 cm/s
LICAPDIAS: -30 cm/s
LICAPSYS: -79 cm/s
Left CCA dist dias: -29 cm/s
Left CCA dist sys: -82 cm/s
Left CCA prox dias: 29 cm/s
Left CCA prox sys: 134 cm/s
Left ICA dist dias: -44 cm/s
Left ICA dist sys: -132 cm/s
RIGHT ECA DIAS: -31 cm/s
RIGHT VERTEBRAL DIAS: -21 cm/s
Right CCA prox dias: -16 cm/s
Right CCA prox sys: -81 cm/s
Right cca dist sys: -85 cm/s

## 2016-06-22 ENCOUNTER — Other Ambulatory Visit: Payer: Self-pay

## 2016-06-22 ENCOUNTER — Ambulatory Visit (INDEPENDENT_AMBULATORY_CARE_PROVIDER_SITE_OTHER): Payer: Medicare Other | Admitting: Family Medicine

## 2016-06-22 ENCOUNTER — Encounter: Payer: Self-pay | Admitting: Family Medicine

## 2016-06-22 VITALS — BP 128/78 | HR 61 | Temp 97.8°F | Wt 159.4 lb

## 2016-06-22 DIAGNOSIS — I8393 Asymptomatic varicose veins of bilateral lower extremities: Secondary | ICD-10-CM

## 2016-06-22 DIAGNOSIS — I10 Essential (primary) hypertension: Secondary | ICD-10-CM

## 2016-06-22 DIAGNOSIS — E7800 Pure hypercholesterolemia, unspecified: Secondary | ICD-10-CM | POA: Diagnosis not present

## 2016-06-22 NOTE — Patient Instructions (Signed)
Varicose Veins Varicose veins are veins that have become enlarged and twisted. They are usually seen in the legs but can occur in other parts of the body as well. What are the causes? This condition is the result of valves in the veins not working properly. Valves in the veins help to return blood from the leg to the heart. If these valves are damaged, blood flows backward and backs up into the veins in the leg near the skin. This causes the veins to become larger. What increases the risk? People who are on their feet a lot, who are pregnant, or who are overweight are more likely to develop varicose veins. What are the signs or symptoms?  Bulging, twisted-appearing, bluish veins, most commonly found on the legs.  Leg pain or a feeling of heaviness. These symptoms may be worse at the end of the day.  Leg swelling.  Changes in skin color. How is this diagnosed? A health care provider can usually diagnose varicose veins by examining your legs. Your health care provider may also recommend an ultrasound of your leg veins. How is this treated? Most varicose veins can be treated at home.However, other treatments are available for people who have persistent symptoms or want to improve the cosmetic appearance of the varicose veins. These treatment options include:  Sclerotherapy. A solution is injected into the vein to close it off.  Laser treatment. A laser is used to heat the vein to close it off.  Radiofrequency vein ablation. An electrical current produced by radio waves is used to close off the vein.  Phlebectomy. The vein is surgically removed through small incisions made over the varicose vein.  Vein ligation and stripping. The vein is surgically removed through incisions made over the varicose vein after the vein has been tied (ligated). Follow these instructions at home:   Do not stand or sit in one position for long periods of time. Do not sit with your legs crossed. Rest with your  legs raised during the day.  Wear compression stockings as directed by your health care provider. These stockings help to prevent blood clots and reduce swelling in your legs.  Do not wear other tight, encircling garments around your legs, pelvis, or waist.  Walk as much as possible to increase blood flow.  Raise the foot of your bed at night with 2-inch blocks.  If you get a cut in the skin over the vein and the vein bleeds, lie down with your leg raised and press on it with a clean cloth until the bleeding stops. Then place a bandage (dressing) on the cut. See your health care provider if it continues to bleed. Contact a health care provider if:  The skin around your ankle starts to break down.  You have pain, redness, tenderness, or hard swelling in your leg over a vein.  You are uncomfortable because of leg pain. This information is not intended to replace advice given to you by your health care provider. Make sure you discuss any questions you have with your health care provider. Document Released: 11/26/2004 Document Revised: 07/25/2015 Document Reviewed: 08/20/2015 Elsevier Interactive Patient Education  2017 [employer].

## 2016-06-22 NOTE — Progress Notes (Signed)
Patient: Jade Gardner Female    DOB: 1945/07/21   71 y.o.   MRN: [id number] Visit Date: 06/22/2016  Today's Provider: Vernie Murders, PA   Chief Complaint  Patient presents with  . Hypertension  . Follow-up   Subjective:    HPI  Hypertension, follow-up:  BP Readings from Last 3 Encounters:  06/22/16 128/78  05/03/16 115/80  04/30/16 (!) 156/98    She was last seen for hypertension 2 months ago.  BP at that visit was 158/104. Management changes since that visit include increased Metoprolol to 75 mg and advised to restrict sodium intake.Dr. Rockey Situ added Amlodipine 10 mg and advised her to start Zetia 10 mg and Lovastatin 20 mg.. She reports good compliance with treatment. She is not having side effects.  She is exercising. She is adherent to low salt diet.   Outside blood pressures are being checked. She is experiencing dizziness and weakness since medication change.  Patient denies chest pain, chest pressure/discomfort, irregular heart beat and palpitations.   Cardiovascular risk factors include advanced age (older than 69 for men, 70 for women), dyslipidemia and hypertension.  Use of agents associated with hypertension: none.     Weight trend: stable Wt Readings from Last 3 Encounters:  06/22/16 159 lb 6.4 oz (72.3 kg)  05/03/16 160 lb (72.6 kg)  04/30/16 160 lb 4 oz (72.7 kg)    Current diet: in general, a "healthy" diet    ------------------------------------------------------------------------  Lipid/Cholesterol, Follow-up:   Last seen for this 2 months ago.  Management changes since that visit include Dr.Gollan advised patient to start Zetia 10 mg and Lovastatin 20 mg. She reports good compliance with treatment. She is having side effects including dizziness and weakness since medication change  Last Lipid Panel:    Component Value Date/Time   CHOL 252 (H) 09/05/2015 1101   CHOL 188 06/28/2013   TRIG 218 (H) 09/05/2015 1101   TRIG 171 06/28/2013     HDL 44 09/05/2015 1101   CHOLHDL 5.7 (H) 09/05/2015 1101   LDLCALC 164 (H) 09/05/2015 1101    Past Medical History:  Diagnosis Date  . Diverticulosis   . Hypercalcemia    Secondar to calcium supplements. Normal PTH  . Pneumonia   . SVT (supraventricular tachycardia) (HCC)    history of  . TIA (transient ischemic attack) 2010   Negative carotids.    Past Surgical History:  Procedure Laterality Date  . APPENDECTOMY  1954  . CARDIAC CATHETERIZATION  [id number]  . CHOLECYSTECTOMY  1981   Family History  Problem Relation Age of Onset  . Heart disease Father   . Dementia Father   . Heart disease Mother   . Heart disease Brother   . Stroke     Allergies  Allergen Reactions  . Fish Oil   . Simvastatin     Other reaction(s): Muscle Pain     Previous Medications   ALBUTEROL (PROVENTIL HFA;VENTOLIN HFA) 108 (90 BASE) MCG/ACT INHALER    Inhale 1-2 puffs into the lungs every 6 (six) hours as needed for wheezing or shortness of breath.   AMLODIPINE (NORVASC) 10 MG TABLET    Take 1 tablet (10 mg total) by mouth daily.   ASPIRIN 81 MG TABLET    Take 81 mg by mouth daily.   BUDESONIDE-FORMOTEROL (SYMBICORT) 160-4.5 MCG/ACT INHALER    Inhale 2 puffs into the lungs 2 (two) times daily.   CALCIUM CARBONATE-VITAMIN D (CALTRATE 600+D PO)    Take by mouth daily.  CHOLECALCIFEROL (VITAMIN D) 2000 UNITS TABLET    Take 2,000 Units by mouth daily.   CLOPIDOGREL (PLAVIX) 75 MG TABLET    TAKE (1) TABLET BY MOUTH EVERY DAY WITH BREAKFAST   CYANOCOBALAMIN (,VITAMIN B-12,) 1000 MCG/ML INJECTION    INJECT 1 ML INTRAMUSCULARLY MONTHLY.   CYCLOBENZAPRINE (FLEXERIL) 10 MG TABLET    Take 0.5-1 tablets (5-10 mg total) by mouth at bedtime as needed for muscle spasms.   EZETIMIBE (ZETIA) 10 MG TABLET    Take 1 tablet (10 mg total) by mouth daily.   FLUTICASONE (FLONASE) 50 MCG/ACT NASAL SPRAY    Place 2 sprays into both nostrils daily.   IBANDRONATE (BONIVA) 150 MG TABLET    TAKE 1 TABLET BY MOUTH  MONTHLY- DRINK WITH FULL GLASS OF WATER, ON AN EMPTY STOMACH AND WITH NO OTHER FOOD/DRINK/MEDICATION   IBUPROFEN (ADVIL,MOTRIN) 200 MG TABLET    Take 200 mg by mouth every 6 (six) hours as needed.   LORATADINE (CLARITIN) 10 MG TABLET    Take 10 mg by mouth daily.   LOVASTATIN (MEVACOR) 20 MG TABLET    Take 1 tablet (20 mg total) by mouth at bedtime.   METOPROLOL SUCCINATE (TOPROL-XL) 50 MG 24 HR TABLET    Take 1 1/2 tablet by mouth daily.   ONDANSETRON (ZOFRAN ODT) 4 MG DISINTEGRATING TABLET    Take 1 tablet (4 mg total) by mouth every 6 (six) hours as needed for nausea or vomiting.   PANTOPRAZOLE (PROTONIX) 40 MG TABLET    Take 1 tablet (40 mg total) by mouth daily.   VITAMIN C (ASCORBIC ACID) 500 MG TABLET    Take 500 mg by mouth daily.   VITAMIN E 1000 UNIT CAPSULE    Take 1,000 Units by mouth daily.    Review of Systems  Respiratory: Negative.   Cardiovascular: Negative.   Musculoskeletal: Negative.   Neurological: Positive for dizziness and weakness.    Social History  Substance Use Topics  . Smoking status: Never Smoker  . Smokeless tobacco: Never Used  . Alcohol use No   Objective:   BP 128/78 (BP Location: Right Arm, Patient Position: Sitting, Cuff Size: Normal)   Pulse 61   Temp 97.8 F (36.6 C) (Oral)   Wt 159 lb 6.4 oz (72.3 kg)   SpO2 98%   BMI 28.69 kg/m   Physical Exam  Constitutional: She is oriented to person, place, and time. She appears well-developed and well-nourished. No distress.  HENT:  Head: Normocephalic and atraumatic.  Right Ear: Hearing normal.  Left Ear: Hearing normal.  Nose: Nose normal.  Eyes: Conjunctivae and lids are normal. Right eye exhibits no discharge. Left eye exhibits no discharge. No scleral icterus.  Neck: Neck supple.  Cardiovascular: Normal rate and regular rhythm.   Pulmonary/Chest: Effort normal and breath sounds normal. No respiratory distress.  Abdominal: Bowel sounds are normal.  Musculoskeletal: Normal range of motion.    Dilated veins in both lower legs/calves. Good peripheral pulses without edema today.  Neurological: She is alert and oriented to person, place, and time.  Skin: Skin is intact. No lesion and no rash noted.  Psychiatric: She has a normal mood and affect. Her speech is normal and behavior is normal. Thought content normal.       Assessment & Plan:     1. Hypertension, essential, benign Very good control of BP since Dr. Rockey Situ (cardiologist) increased the Metoprolol to 75 mg qd and added Amlodipine 10 mg 1/2 tablet daily. Some fatigue  and lightheaded sensation with working to clean the bathroom occasionally. Advised to prepare for orthostatic changes but not bending over and standing too quickly or often. Increase fluid intake and schedule Medicare AWE.  2. Hypercholesterolemia without hypertriglyceridemia Tolerating Lovastatin and Zetia prescribed by Dr. Rockey Situ with out significant muscle aches. Trying to follow the low fat diet and plans follow up with Dr. Rockey Situ in 6 months.  3. Varicose veins of both lower extremities without ulcer or inflammation Noticing swelling of the right ankle in the evenings. Enlarged veins present in both calves. Recommend using support hose. No edema this morning. Denies dyspnea or palpitations.

## 2016-06-26 ENCOUNTER — Other Ambulatory Visit: Payer: Self-pay

## 2016-06-26 MED ORDER — AMLODIPINE BESYLATE 10 MG PO TABS: 10.0000 mg | ORAL_TABLET | Freq: Every day | ORAL | 3 refills | Status: DC

## 2016-06-26 MED ORDER — MONTELUKAST SODIUM 10 MG PO TABS: 10.0000 mg | ORAL_TABLET | Freq: Every day | ORAL | 3 refills | Status: DC

## 2016-06-26 MED ORDER — CYCLOBENZAPRINE HCL 10 MG PO TABS: 5.0000 mg | ORAL_TABLET | Freq: Every evening | ORAL | 1 refills | Status: AC | PRN

## 2016-06-26 MED ORDER — CLOPIDOGREL BISULFATE 75 MG PO TABS: ORAL_TABLET | ORAL | 3 refills | Status: DC

## 2016-06-26 MED ORDER — PANTOPRAZOLE SODIUM 40 MG PO TBEC: 40.0000 mg | DELAYED_RELEASE_TABLET | Freq: Every day | ORAL | 3 refills | Status: DC

## 2016-06-26 NOTE — Telephone Encounter (Signed)
Refill request received from [facility] requesting Montelukast, Pantoprazole, Cyclobenzaprine, Clopidogrel, and Amlodipine.

## 2016-06-27 IMAGING — CR DG CHEST 2V
2 series · 2 of 2 positions shown · non-contrast
Comparison: 08/16/06

CLINICAL DATA: PT with heavy productive cough x one day. Hx of
pneumonia two years ago. No hx of trauma or injury. Hx of
cholecystectomy.. No hx of CA.

EXAM:
CHEST  2 VIEW

[chest pa]
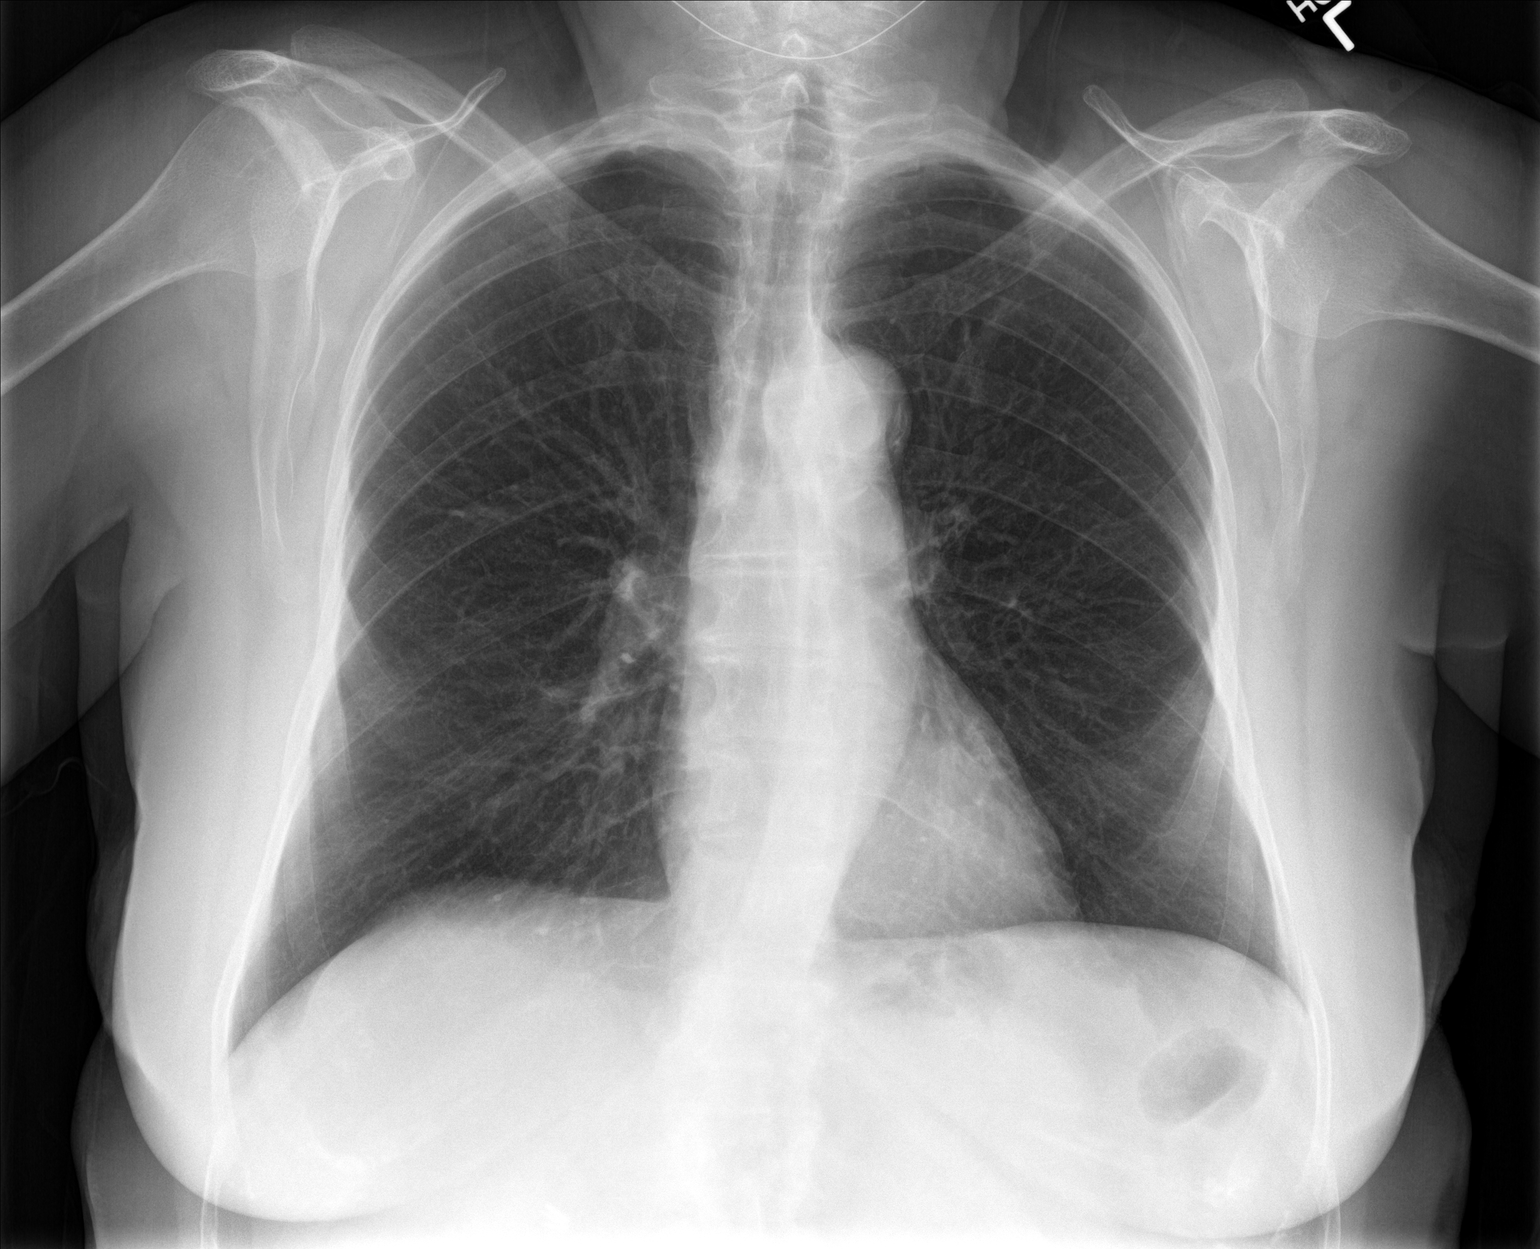

[chest lat]
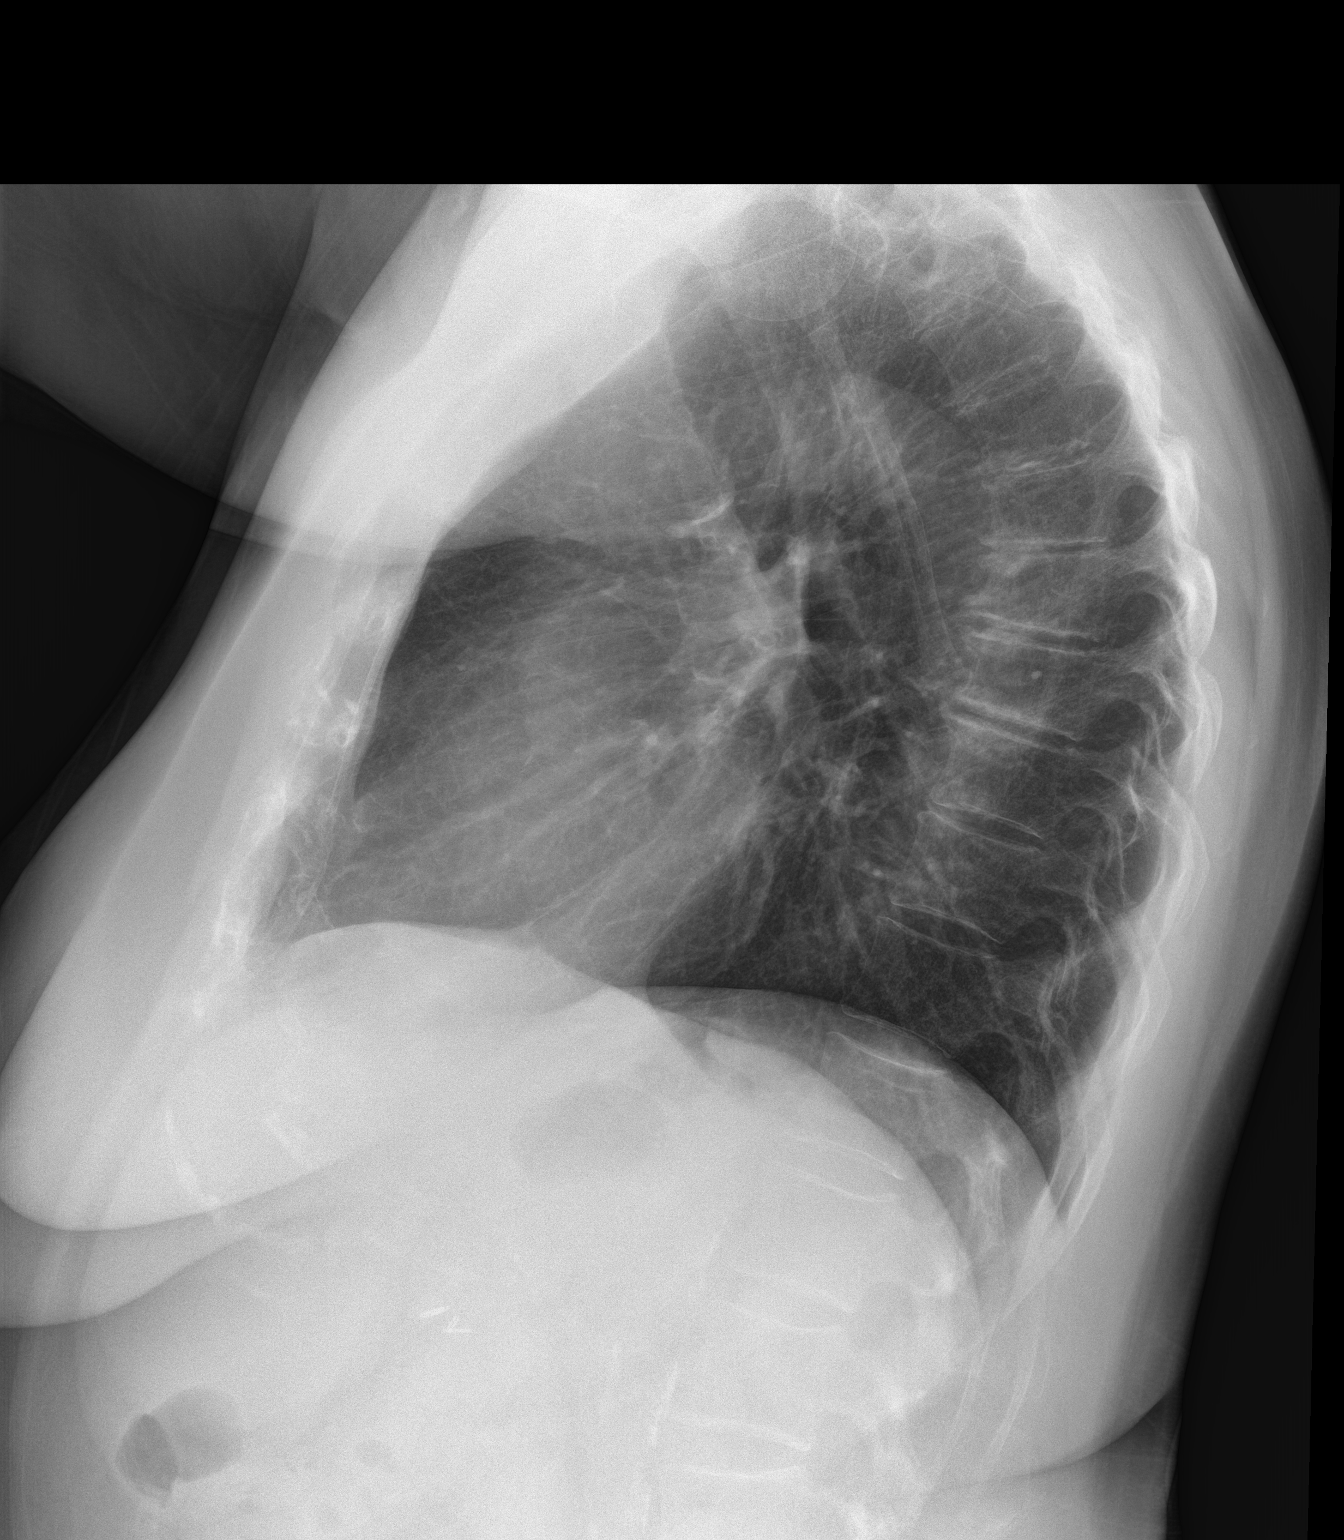

[2 of 2 positions shown; findings below may reference images not displayed]

FINDINGS: The heart size and mediastinal contours are within normal limits.
Lungs are mildly hyperexpanded but clear. No pleural effusion or
pneumothorax. Bony thorax is demineralized but intact.
IMPRESSION: No active cardiopulmonary disease.

## 2016-08-16 ENCOUNTER — Ambulatory Visit
Admission: EM | Admit: 2016-08-16 | Discharge: 2016-08-16 | Disposition: A | Discharge status: Home / Self Care | Payer: Medicare Other | Attending: Internal Medicine | Admitting: Internal Medicine

## 2016-08-16 DIAGNOSIS — N3001 Acute cystitis with hematuria: Secondary | ICD-10-CM | POA: Diagnosis not present

## 2016-08-16 LAB — URINALYSIS, COMPLETE (UACMP) WITH MICROSCOPIC
Bilirubin Urine: NEGATIVE
Glucose, UA: NEGATIVE mg/dL
KETONES UR: NEGATIVE mg/dL
Nitrite: NEGATIVE
PROTEIN: 100 mg/dL — AB
SQUAMOUS EPITHELIAL / LPF: NONE SEEN
Specific Gravity, Urine: 1.02 (ref 1.005–1.030)
pH: 8.5 — ABNORMAL HIGH (ref 5.0–8.0)

## 2016-08-16 MED ORDER — NITROFURANTOIN MONOHYD MACRO 100 MG PO CAPS: 100.0000 mg | ORAL_CAPSULE | Freq: Two times a day (BID) | ORAL | 0 refills | Status: AC

## 2016-08-16 NOTE — ED Triage Notes (Signed)
Pt with frequency and dysuria starting yesterday. Pain to bladder area 3/10

## 2016-08-16 NOTE — ED Provider Notes (Signed)
CSN: [id number]     Arrival date & time 08/16/16  1502 History   None    Chief Complaint  Patient presents with  . Dysuria   (Consider location/radiation/quality/duration/timing/severity/associated sxs/prior Treatment) Patient is a 71 y.o female, here for possible UTI. She reports 2-day duration of dysuria accompany by malodorous urine, urinary frequency and urgency. She otherwise denies fever, abdominal pain, nausea or back pain. She also denies abnormal vaginal dicahrge. she is not sexually active.    The history is provided by the patient.    Past Medical History:  Diagnosis Date  . Diverticulosis   . Hypercalcemia    Secondar to calcium supplements. Normal PTH  . Pneumonia   . SVT (supraventricular tachycardia) (HCC)    history of  . TIA (transient ischemic attack) 2010   Negative carotids.    Past Surgical History:  Procedure Laterality Date  . APPENDECTOMY  1954  . CARDIAC CATHETERIZATION  [id number]  . CHOLECYSTECTOMY  1981   Family History  Problem Relation Age of Onset  . Heart disease Father   . Dementia Father   . Heart disease Mother   . Heart disease Brother   . Stroke Unknown    Social History  Substance Use Topics  . Smoking status: Never Smoker  . Smokeless tobacco: Never Used  . Alcohol use No   OB History    No data available     Review of Systems  Constitutional:       See HPI    Allergies  Fish oil and Simvastatin  Home Medications   Prior to Admission medications   Medication Sig Start Date End Date Taking? Authorizing Provider  albuterol (PROVENTIL HFA;VENTOLIN HFA) 108 (90 Base) MCG/ACT inhaler Inhale 1-2 puffs into the lungs every 6 (six) hours as needed for wheezing or shortness of breath. Patient not taking: Reported on 06/22/2016 04/21/15   Jan Fireman, PA-C  amLODipine (NORVASC) 10 MG tablet Take 1 tablet (10 mg total) by mouth daily. 06/26/16 06/26/17  Chrismon, Vickki Muff, PA  aspirin 81 MG tablet Take 81 mg by mouth daily.     [provider]  budesonide-formoterol (SYMBICORT) 160-4.5 MCG/ACT inhaler Inhale 2 puffs into the lungs 2 (two) times daily. 04/30/15 10/31/15  Birdie Sons, MD  Calcium Carbonate-Vitamin D (CALTRATE 600+D PO) Take by mouth daily.     [provider]  Cholecalciferol (VITAMIN D) 2000 UNITS tablet Take 2,000 Units by mouth daily.    [provider]  clopidogrel (PLAVIX) 75 MG tablet TAKE (1) TABLET BY MOUTH EVERY DAY WITH BREAKFAST 06/26/16   Chrismon, Simona Huh E, PA  cyanocobalamin (,VITAMIN B-12,) 1000 MCG/ML injection INJECT 1 ML INTRAMUSCULARLY MONTHLY. 02/27/15   Birdie Sons, MD  cyclobenzaprine (FLEXERIL) 10 MG tablet Take 0.5-1 tablets (5-10 mg total) by mouth at bedtime as needed for muscle spasms. 06/26/16   Chrismon, Vickki Muff, PA  ezetimibe (ZETIA) 10 MG tablet Take 1 tablet (10 mg total) by mouth daily. 04/30/16   Minna Merritts, MD  fluticasone (FLONASE) 50 MCG/ACT nasal spray Place 2 sprays into both nostrils daily. 04/21/15   Jan Fireman, PA-C  ibandronate (BONIVA) 150 MG tablet TAKE 1 TABLET BY MOUTH MONTHLY- DRINK WITH FULL GLASS OF WATER, ON AN EMPTY STOMACH AND WITH NO OTHER FOOD/DRINK/MEDICATION Patient not taking: Reported on 06/22/2016 09/12/15   Birdie Sons, MD  ibuprofen (ADVIL,MOTRIN) 200 MG tablet Take 200 mg by mouth every 6 (six) hours as needed.    [provider]  loratadine (CLARITIN) 10 MG tablet Take 10 mg by mouth daily.    [provider]  lovastatin (MEVACOR) 20 MG tablet Take 1 tablet (20 mg total) by mouth at bedtime. 04/30/16 04/30/17  Minna Merritts, MD  metoprolol succinate (TOPROL-XL) 50 MG 24 hr tablet Take 1 1/2 tablet by mouth daily. 06/16/16   Minna Merritts, MD  montelukast (SINGULAIR) 10 MG tablet Take 1 tablet (10 mg total) by mouth at bedtime. 06/26/16   Chrismon, Vickki Muff, PA  nitrofurantoin, macrocrystal-monohydrate, (MACROBID) 100 MG capsule Take 1 capsule (100 mg total) by mouth 2 (two) times  daily. 08/16/16 08/21/16  Barry Dienes, NP  ondansetron (ZOFRAN ODT) 4 MG disintegrating tablet Take 1 tablet (4 mg total) by mouth every 6 (six) hours as needed for nausea or vomiting. 07/08/15   Delman Kitten, MD  pantoprazole (PROTONIX) 40 MG tablet Take 1 tablet (40 mg total) by mouth daily. 06/26/16   Chrismon, Vickki Muff, PA  vitamin C (ASCORBIC ACID) 500 MG tablet Take 500 mg by mouth daily.    [provider]  vitamin E 1000 UNIT capsule Take 1,000 Units by mouth daily.    [provider]   Meds Ordered and Administered this Visit  Medications - No data to display  BP 139/71 (BP Location: Left Arm)   Pulse 71   Temp 97.9 F (36.6 C) (Oral)   Resp 16   Ht 5' 2.5" (1.588 m)   Wt 158 lb (71.7 kg)   SpO2 97%   BMI 28.44 kg/m  No data found.   Physical Exam  Constitutional: She is oriented to person, place, and time. She appears well-developed and well-nourished.  Cardiovascular: Normal rate, regular rhythm and normal heart sounds.   Pulmonary/Chest: Effort normal and breath sounds normal. She has no wheezes.  Abdominal: Soft. Bowel sounds are normal. There is no tenderness.  Genitourinary:  Genitourinary Comments: Negative CVA Tenderness  Neurological: She is alert and oriented to person, place, and time.  Skin: Skin is warm and dry.  Nursing note and vitals reviewed.   Urgent Care Course     Procedures (including critical care time)  Labs Review Labs Reviewed  URINALYSIS, COMPLETE (UACMP) WITH MICROSCOPIC - Abnormal; Notable for the following:       Result Value   APPearance CLOUDY (*)    pH 8.5 (*)    Hgb urine dipstick TRACE (*)    Protein, ur 100 (*)    Leukocytes, UA LARGE (*)    Bacteria, UA MANY (*)    All other components within normal limits  URINE CULTURE    Imaging Review No results found.   MDM   1. Acute cystitis with hematuria    UA indicatives of UTI. Urine culture pending. Will start Macrobid 100 mg BID x 5 days. Patient  questioned about cranberry juice; education provided; informed that this is not harmful to try for prevention.   Discharge paperwork given.  RX send to her pharmacy.     Barry Dienes, NP 08/16/16 1540

## 2016-08-20 ENCOUNTER — Telehealth: Payer: Self-pay | Admitting: *Deleted

## 2016-08-20 LAB — URINE CULTURE: Culture: >=100000 — AB

## 2016-08-20 MED ORDER — CEFDINIR 300 MG PO CAPS: 300.0000 mg | ORAL_CAPSULE | Freq: Two times a day (BID) | ORAL | 0 refills | Status: DC

## 2016-09-17 ENCOUNTER — Ambulatory Visit
Admission: RE | Admit: 2016-09-17 | Discharge: 2016-09-17 | Disposition: A | Discharge status: Home / Self Care | Payer: Medicare Other | Source: Ambulatory Visit | Attending: Family Medicine | Admitting: Family Medicine

## 2016-09-17 ENCOUNTER — Ambulatory Visit (INDEPENDENT_AMBULATORY_CARE_PROVIDER_SITE_OTHER): Payer: Medicare Other

## 2016-09-17 ENCOUNTER — Encounter: Payer: Self-pay | Admitting: Family Medicine

## 2016-09-17 ENCOUNTER — Ambulatory Visit (INDEPENDENT_AMBULATORY_CARE_PROVIDER_SITE_OTHER): Payer: Medicare Other | Admitting: Family Medicine

## 2016-09-17 VITALS — BP 144/74 | HR 60 | Temp 98.8°F | Wt 159.4 lb

## 2016-09-17 VITALS — BP 144/74 | HR 60 | Temp 98.8°F | Ht 63.0 in | Wt 159.4 lb

## 2016-09-17 DIAGNOSIS — E538 Deficiency of other specified B group vitamins: Secondary | ICD-10-CM

## 2016-09-17 DIAGNOSIS — Z0000 Encounter for general adult medical examination without abnormal findings: Secondary | ICD-10-CM

## 2016-09-17 DIAGNOSIS — M85871 Other specified disorders of bone density and structure, right ankle and foot: Secondary | ICD-10-CM | POA: Diagnosis not present

## 2016-09-17 DIAGNOSIS — K2100 Gastro-esophageal reflux disease with esophagitis, without bleeding: Secondary | ICD-10-CM

## 2016-09-17 DIAGNOSIS — K7581 Nonalcoholic steatohepatitis (NASH): Secondary | ICD-10-CM | POA: Diagnosis not present

## 2016-09-17 DIAGNOSIS — E559 Vitamin D deficiency, unspecified: Secondary | ICD-10-CM

## 2016-09-17 DIAGNOSIS — S99921A Unspecified injury of right foot, initial encounter: Secondary | ICD-10-CM | POA: Diagnosis not present

## 2016-09-17 DIAGNOSIS — M79671 Pain in right foot: Secondary | ICD-10-CM

## 2016-09-17 DIAGNOSIS — I10 Essential (primary) hypertension: Secondary | ICD-10-CM | POA: Diagnosis not present

## 2016-09-17 DIAGNOSIS — E7800 Pure hypercholesterolemia, unspecified: Secondary | ICD-10-CM | POA: Diagnosis not present

## 2016-09-17 NOTE — Progress Notes (Signed)
Patient: Jade Gardner, Female    DOB: [birth year], 71 y.o.   MRN: [id number] Visit Date: 09/17/2016  Today's Provider: Vernie Murders, PA   Chief Complaint  Patient presents with  . Annual Exam   Subjective:    Annual physical exam Jade Gardner is a 71 y.o. female who presents today for health maintenance and complete physical. She feels fairly well. 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 Will start back when school starts back. She reports she is sleeping fair with awakening several times during the night.  -----------------------------------------------------------------   Review of Systems  Constitutional: Negative.   HENT: Negative.   Eyes: Negative.   Respiratory: Negative.   Cardiovascular: Positive for leg swelling (left foot swelling, worse in the evening).  Gastrointestinal: Positive for nausea.  Endocrine: Negative.   Genitourinary: Negative.   Musculoskeletal: Negative.   Skin: Negative.   Neurological: Positive for dizziness.  Hematological: Negative.   Psychiatric/Behavioral: Negative.     Social History      She  reports that she has never smoked. She has never used smokeless tobacco. She reports that she does not drink alcohol or use drugs.       Social History   Social History  . Marital status: Widowed    Spouse name: N/A  . Number of children: 2  . Years of education: HS Grad   Occupational History  . Retired     [employer]   Social History Main Topics  . Smoking status: Never Smoker  . Smokeless tobacco: Never Used  . Alcohol use No  . Drug use: No  . Sexual activity: Not Asked   Other Topics Concern  . None   Social History Narrative  . None    Past Medical History:  Diagnosis Date  . Diverticulosis   . Hypercalcemia    Secondar to calcium supplements. Normal PTH  . Pneumonia   . SVT (supraventricular tachycardia) (HCC)    history of  . TIA (transient ischemic attack) 2010   Negative carotids.   Marland Kitchen UTI (urinary tract infection)      Patient Active Problem List   Diagnosis Date Noted  . Hypertension 04/27/2016  . Hyperlipidemia 11/28/2015  . Hay fever 05/29/2015  . NASH (nonalcoholic steatohepatitis) 05/29/2015  . H/O transient cerebral ischemia 05/29/2015  . Personal history of other diseases of the musculoskeletal system and connective tissue 05/29/2015  . Bronchitis, mucopurulent recurrent ([place]) 05/29/2015  . Diverticulosis 08/24/2014  . Fibrocystic breast disease 08/24/2014  . Acquired sideroblastic anemia 08/23/2014  . Allergic rhinitis 08/23/2014  . DDD (degenerative disc disease), thoracolumbar 08/23/2014  . Abnormal liver enzymes 08/23/2014  . Hearing loss 08/23/2014  . Insomnia 08/23/2014  . Osteopenia 08/23/2014  . Overweight (BMI 25.0-29.9) 08/23/2014  . Pre-diabetes 08/23/2014  . Obstructive sleep apnea 08/23/2014  . Varicose veins of both lower extremities without ulcer or inflammation 08/23/2014  . B12 deficiency 08/23/2014  . Vitamin D deficiency 08/23/2014  . Carotid stenosis 09/15/2013  . Hypertension, essential, benign 05/26/2011  . SVT (supraventricular tachycardia) ([place]) 05/26/2011  . Nonalcoholic steatohepatitis (NASH) 05/20/2009  . Hypercholesterolemia without hypertriglyceridemia 05/20/2009  . Pure hypercholesterolemia 05/20/2009  . Diaphragmatic hernia 08/10/2008  . Cardiac conduction disorder 08/10/2008  . Benign essential HTN 08/10/2008  . GERD (gastroesophageal reflux disease) 08/10/2008    Past Surgical History:  Procedure Laterality Date  . APPENDECTOMY  1954  . CARDIAC CATHETERIZATION  [id number]  . CHOLECYSTECTOMY  1981  Family History        Family Status  Relation Status  . Father Deceased at age 58       pneumonia  . Mother Deceased at age 47       MI  . Brother Alive  . Sister Alive  . Daughter Alive  . Son Alive  . Unknown (Not Specified)        Her family history includes Dementia in her  father; Heart disease in her brother, father, and mother; Stroke in her unknown relative.     Allergies  Allergen Reactions  . Fish Oil   . Simvastatin     Other reaction(s): Muscle Pain     Current Outpatient Prescriptions:  .  amLODipine (NORVASC) 10 MG tablet, Take 1 tablet (10 mg total) by mouth daily., Disp: 90 tablet, Rfl: 3 .  aspirin 81 MG tablet, Take 81 mg by mouth daily., Disp: , Rfl:  .  clopidogrel (PLAVIX) 75 MG tablet, TAKE (1) TABLET BY MOUTH EVERY DAY WITH BREAKFAST, Disp: 90 tablet, Rfl: 3 .  cyclobenzaprine (FLEXERIL) 10 MG tablet, Take 0.5-1 tablets (5-10 mg total) by mouth at bedtime as needed for muscle spasms., Disp: 90 tablet, Rfl: 1 .  ezetimibe (ZETIA) 10 MG tablet, Take 1 tablet (10 mg total) by mouth daily., Disp: 90 tablet, Rfl: 3 .  fluticasone (FLONASE) 50 MCG/ACT nasal spray, Place 2 sprays into both nostrils daily. (Patient taking differently: Place 2 sprays into both nostrils daily. ), Disp: 16 g, Rfl: 2 .  ibuprofen (ADVIL,MOTRIN) 200 MG tablet, Take 200 mg by mouth every 6 (six) hours as needed., Disp: , Rfl:  .  loratadine (CLARITIN) 10 MG tablet, Take 10 mg by mouth daily., Disp: , Rfl:  .  lovastatin (MEVACOR) 20 MG tablet, Take 1 tablet (20 mg total) by mouth at bedtime., Disp: 90 tablet, Rfl: 3 .  metoprolol succinate (TOPROL-XL) 50 MG 24 hr tablet, Take 1 1/2 tablet by mouth daily., Disp: 135 tablet, Rfl: 3 .  montelukast (SINGULAIR) 10 MG tablet, Take 1 tablet (10 mg total) by mouth at bedtime., Disp: 90 tablet, Rfl: 3 .  ondansetron (ZOFRAN ODT) 4 MG disintegrating tablet, Take 1 tablet (4 mg total) by mouth every 6 (six) hours as needed for nausea or vomiting., Disp: 20 tablet, Rfl: 0 .  pantoprazole (PROTONIX) 40 MG tablet, Take 1 tablet (40 mg total) by mouth daily., Disp: 90 tablet, Rfl: 3 .  vitamin E 1000 UNIT capsule, Take 1,000 Units by mouth daily., Disp: , Rfl:  .  albuterol (PROVENTIL HFA;VENTOLIN HFA) 108 (90 Base) MCG/ACT inhaler,  Inhale 1-2 puffs into the lungs every 6 (six) hours as needed for wheezing or shortness of breath. (Patient not taking: Reported on 06/22/2016), Disp: 1 Inhaler, Rfl: 0 .  budesonide-formoterol (SYMBICORT) 160-4.5 MCG/ACT inhaler, Inhale 2 puffs into the lungs 2 (two) times daily., Disp: 1 Inhaler, Rfl: 0 .  ibandronate (BONIVA) 150 MG tablet, TAKE 1 TABLET BY MOUTH MONTHLY- DRINK WITH FULL GLASS OF WATER, ON AN EMPTY STOMACH AND WITH NO OTHER FOOD/DRINK/MEDICATION (Patient not taking: Reported on 06/22/2016), Disp: 1 tablet, Rfl: 12  Current Facility-Administered Medications:  .  cyanocobalamin ((VITAMIN B-12)) injection 1,000 mcg, 1,000 mcg, Intramuscular, Q30 days, Birdie Sons, MD, 1,000 mcg at 08/23/14 1032   Patient Care Team: ChrismonVickki Muff, PA as PCP - General (Family Medicine) Minna Merritts, MD as Consulting Physician (Cardiology)      Objective:   Vitals: BP (!) 144/74  Pulse 60   Temp 98.8 F (37.1 C) (Oral)   Wt 159 lb 6.4 oz (72.3 kg)   BMI 28.24 kg/m    Wt Readings from Last 3 Encounters:  09/17/16 159 lb 6.4 oz (72.3 kg)  09/17/16 159 lb 6.4 oz (72.3 kg)  08/16/16 158 lb (71.7 kg)   BP Readings from Last 3 Encounters:  09/17/16 (!) 144/74  09/17/16 (!) 144/74  08/16/16 139/71    Vitals:   09/17/16 0851  BP: (!) 144/74  Pulse: 60  Temp: 98.8 F (37.1 C)  TempSrc: Oral  Weight: 159 lb 6.4 oz (72.3 kg)     Physical Exam  Constitutional: She is oriented to person, place, and time. She appears well-developed and well-nourished.  HENT:  Head: Normocephalic and atraumatic.  Right Ear: External ear normal.  Left Ear: External ear normal.  Nose: Nose normal.  Mouth/Throat: Oropharynx is clear and moist.  Eyes: Pupils are equal, round, and reactive to light. Conjunctivae and EOM are normal. Right eye exhibits no discharge.  Neck: Normal range of motion. Neck supple. No tracheal deviation present. No thyromegaly present.  Cardiovascular: Normal  rate, regular rhythm, normal heart sounds and intact distal pulses.   No murmur heard. Pulmonary/Chest: Effort normal and breath sounds normal. No respiratory distress. She has no wheezes. She has no rales. She exhibits no tenderness.  Abdominal: Soft. She exhibits no distension and no mass. There is no tenderness. There is no rebound and no guarding.  Musculoskeletal: Normal range of motion.  Varicose veins and slight puffiness of the lower legs (R>L). Good pulses and no pitting edema today. Some tenderness plantar surface of the right heel anteriorly.  Lymphadenopathy:    She has no cervical adenopathy.  Neurological: She is alert and oriented to person, place, and time. She has normal reflexes. No cranial nerve deficit. She exhibits normal muscle tone. Coordination normal.  Skin: Skin is warm and dry. No rash noted. No erythema.  Psychiatric: She has a normal mood and affect. Her behavior is normal. Judgment and thought content normal.   Depression Screen PHQ 2/9 Scores 09/17/2016 09/05/2015 08/23/2014  PHQ - 2 Score 2 2 0  PHQ- 9 Score 6 5 -   Assessment & Plan:     Routine Health Maintenance and Physical Exam  Exercise Activities and Dietary recommendations Goals    . Exercise 3x per week (30 min per time)          Recommend to start exercising 3 times a week for 30 minutes. Pt to start in the fall.        Immunization History  Administered Date(s) Administered  . Hep A / Hep B 12/17/2008, 01/14/2009, 06/14/2009  . Hepatitis A 12/17/2008  . Hepatitis B 12/17/2008, 01/14/2009, 06/14/2009, 06/14/2009  . Influenza Split 01/07/2009, 12/12/2009, 12/24/2011  . Influenza,inj,Quad PF,36+ Mos 12/30/2012, 11/08/2013, 03/06/2015  . Influenza-Unspecified 12/31/2012, 11/08/2013, 11/29/2015  . Pneumococcal Conjugate-13 01/18/2014  . Pneumococcal Polysaccharide-23 08/12/2011    Health Maintenance  Topic Date Due  . TETANUS/TDAP  08/30/2017 (Originally 03/08/1964)  . INFLUENZA VACCINE   09/30/2016  . MAMMOGRAM  09/04/2017  . COLONOSCOPY  07/04/2019  . DEXA SCAN  Completed  . Hepatitis C Screening  Completed  . PNA vac Low Risk Adult  Completed     Discussed health benefits of physical activity, and encouraged her to engage in regular exercise appropriate for her age and condition.    -------------------------------------------------------------------- 1. Annual physical exam General health status stable. Still has some  fatigue and general malaise occasionally. Immunizations, colonoscopy and screening exams are essentially normal and up to date (patient declines tetanus up date today). Given anticipatory guidance. Reviewed Nurse Health Advisor's documentation of Medicare Wellness screening and agree with recommendations.  2. Right [place] pain [place] Some swelling by the end of the day. Tender over the plantar surface of the anterior heel. No pitting edema today. Will get x-ray to rule out bony abnormality. May need referral to a podiatrist pending reports. - DG Foot Complete Right  3. Hypertension, essential, benign BP stable on Amlodipine 10 mg qd with Metoprolol Succinate 50 mg qd per Dr. Rockey Situ (cardiologist). Will recheck labs and follow up pending reports. - CBC with Differential/Platelet - Comprehensive metabolic panel - TSH  4. Gastroesophageal reflux disease with esophagitis Well controlled with use of Protonix for the past couple years. Recommend step down to H2 Blocker (Zantac 150 mg BID) and hold protonix. Check CBC and follow up as needed. - CBC with Differential/Platelet  5. Nonalcoholic steatohepatitis (NASH) Some general malaise with rare nausea. Will recheck liver function. - Comprehensive metabolic panel  6. Hypercholesterolemia without hypertriglyceridemia Seems to tolerate Lovastatin and Zetia. Has a history of muscle pains with use of Simvastatin. Recheck labs. May need to consider D/C Lovastatin. -  Comprehensive metabolic panel - Lipid panel - TSH  7. B12 deficiency Has had deficiency requiring B12 injections monthly in the past. Stopped them when her insurance stopped coverage. Will recheck labs to see if this level has dropped to create some of her malaise. - CBC with Differential/Platelet - B12  8. Vitamin D deficiency No longer taking Vitamin-D supplement, calcium or Boniva for osteoporosis. Fatigue more of an issue now. Recheck labs. - CBC with Differential/Platelet - VITAMIN D 25 Hydroxy (Vit-D Deficiency, Fractures)    Vernie Murders, PA  [facility]

## 2016-09-17 NOTE — Progress Notes (Signed)
Subjective:   Jade Gardner is a 71 y.o. female who presents for Medicare Annual (Subsequent) preventive examination.  Review of Systems:  N/A  Cardiac Risk Factors include: advanced age (>67mn, >>73women);dyslipidemia;hypertension     Objective:     Vitals: BP (!) 144/74 (BP Location: Left Arm)   Pulse 60   Temp 98.8 F (37.1 C) (Oral)   Ht 5' 3"  (1.6 m)   Wt 159 lb 6.4 oz (72.3 kg)   BMI 28.24 kg/m   Body mass index is 28.24 kg/m.   Tobacco History  Smoking Status  . Never Smoker  Smokeless Tobacco  . Never Used     Counseling given: Not Answered   Past Medical History:  Diagnosis Date  . Diverticulosis   . Hypercalcemia    Secondar to calcium supplements. Normal PTH  . Pneumonia   . SVT (supraventricular tachycardia) (HCC)    history of  . TIA (transient ischemic attack) 2010   Negative carotids.   .Marland KitchenUTI (urinary tract infection)    Past Surgical History:  Procedure Laterality Date  . APPENDECTOMY  1954  . CARDIAC CATHETERIZATION  [id number] . CHOLECYSTECTOMY  1981   Family History  Problem Relation Age of Onset  . Heart disease Father   . Dementia Father   . Heart disease Mother   . Heart disease Brother   . Stroke Unknown    History  Sexual Activity  . Sexual activity: Not on file    Outpatient Encounter Prescriptions as of 09/17/2016  Medication Sig  . amLODipine (NORVASC) 10 MG tablet Take 1 tablet (10 mg total) by mouth daily.  .Marland Kitchenaspirin 81 MG tablet Take 81 mg by mouth daily.  . clopidogrel (PLAVIX) 75 MG tablet TAKE (1) TABLET BY MOUTH EVERY DAY WITH BREAKFAST  . cyclobenzaprine (FLEXERIL) 10 MG tablet Take 0.5-1 tablets (5-10 mg total) by mouth at bedtime as needed for muscle spasms.  .Marland Kitchenezetimibe (ZETIA) 10 MG tablet Take 1 tablet (10 mg total) by mouth daily.  . fluticasone (FLONASE) 50 MCG/ACT nasal spray Place 2 sprays into both nostrils daily. (Patient taking differently: Place 2 sprays into both nostrils daily. )  .  ibuprofen (ADVIL,MOTRIN) 200 MG tablet Take 200 mg by mouth every 6 (six) hours as needed.  . loratadine (CLARITIN) 10 MG tablet Take 10 mg by mouth daily.  .Marland Kitchenlovastatin (MEVACOR) 20 MG tablet Take 1 tablet (20 mg total) by mouth at bedtime.  . metoprolol succinate (TOPROL-XL) 50 MG 24 hr tablet Take 1 1/2 tablet by mouth daily.  . montelukast (SINGULAIR) 10 MG tablet Take 1 tablet (10 mg total) by mouth at bedtime.  . ondansetron (ZOFRAN ODT) 4 MG disintegrating tablet Take 1 tablet (4 mg total) by mouth every 6 (six) hours as needed for nausea or vomiting.  . pantoprazole (PROTONIX) 40 MG tablet Take 1 tablet (40 mg total) by mouth daily.  .Marland Kitchenalbuterol (PROVENTIL HFA;VENTOLIN HFA) 108 (90 Base) MCG/ACT inhaler Inhale 1-2 puffs into the lungs every 6 (six) hours as needed for wheezing or shortness of breath. (Patient not taking: Reported on 06/22/2016)  . budesonide-formoterol (SYMBICORT) 160-4.5 MCG/ACT inhaler Inhale 2 puffs into the lungs 2 (two) times daily.  .Marland Kitchenibandronate (BONIVA) 150 MG tablet TAKE 1 TABLET BY MOUTH MONTHLY- DRINK WITH FULL GLASS OF WATER, ON AN EMPTY STOMACH AND WITH NO OTHER FOOD/DRINK/MEDICATION (Patient not taking: Reported on 06/22/2016)  . vitamin E 1000 UNIT capsule Take 1,000 Units by mouth daily.  . [  DISCONTINUED] Calcium Carbonate-Vitamin D (CALTRATE 600+D PO) Take by mouth daily.   . [DISCONTINUED] cefdinir (OMNICEF) 300 MG capsule Take 1 capsule (300 mg total) by mouth 2 (two) times daily.  . [DISCONTINUED] Cholecalciferol (VITAMIN D) 2000 UNITS tablet Take 2,000 Units by mouth daily.  . [DISCONTINUED] cyanocobalamin (,VITAMIN B-12,) 1000 MCG/ML injection INJECT 1 ML INTRAMUSCULARLY MONTHLY.  . [DISCONTINUED] vitamin C (ASCORBIC ACID) 500 MG tablet Take 500 mg by mouth daily.   Facility-Administered Encounter Medications as of 09/17/2016  Medication  . cyanocobalamin ((VITAMIN B-12)) injection 1,000 mcg    Activities of Daily Living In your present state of  health, do you have any difficulty performing the following activities: 09/17/2016  Hearing? Y  Vision? N  Difficulty concentrating or making decisions? N  Walking or climbing stairs? N  Dressing or bathing? N  Doing errands, shopping? N  Preparing Food and eating ? N  Using the Toilet? N  In the past six months, have you accidently leaked urine? Y  Do you have problems with loss of bowel control? N  Managing your Medications? N  Managing your Finances? N  Housekeeping or managing your Housekeeping? N  Some recent data might be hidden    Patient Care Team: Chrismon, Vickki Muff, PA as PCP - General (Family Medicine) Minna Merritts, MD as Consulting Physician (Cardiology)    Assessment:     Exercise Activities and Dietary recommendations Current Exercise Habits: The patient does not participate in regular exercise at present, Exercise limited by: None identified  Goals    . Exercise 3x per week (30 min per time)          Recommend to start exercising 3 times a week for 30 minutes. Pt to start in the fall.       Fall Risk Fall Risk  09/17/2016 09/05/2015 08/23/2014  Falls in the past year? No No No   Depression Screen PHQ 2/9 Scores 09/17/2016 09/05/2015 08/23/2014  PHQ - 2 Score 2 2 0  PHQ- 9 Score 6 5 -     Cognitive Function     6CIT Screen 09/17/2016  What Year? 0 points  What month? 0 points  What time? 0 points  Count back from 20 0 points  Months in reverse 0 points  Repeat phrase 0 points  Total Score 0    Immunization History  Administered Date(s) Administered  . Hep A / Hep B 12/17/2008, 01/14/2009, 06/14/2009  . Hepatitis A 12/17/2008  . Hepatitis B 12/17/2008, 01/14/2009, 06/14/2009, 06/14/2009  . Influenza Split 01/07/2009, 12/12/2009, 12/24/2011  . Influenza,inj,Quad PF,36+ Mos 12/30/2012, 11/08/2013, 03/06/2015  . Influenza-Unspecified 12/31/2012, 11/08/2013, 11/29/2015  . Pneumococcal Conjugate-13 01/18/2014  . Pneumococcal Polysaccharide-23  08/12/2011   Screening Tests Health Maintenance  Topic Date Due  . TETANUS/TDAP  08/30/2017 (Originally 03/08/1964)  . INFLUENZA VACCINE  09/30/2016  . MAMMOGRAM  09/04/2017  . COLONOSCOPY  07/04/2019  . DEXA SCAN  Completed  . Hepatitis C Screening  Completed  . PNA vac Low Risk Adult  Completed      Plan:  I have personally reviewed and addressed the Medicare Annual Wellness questionnaire and have noted the following in the patient's chart:  A. Medical and social history B. Use of alcohol, tobacco or illicit drugs  C. Current medications and supplements D. Functional ability and status E.  Nutritional status F.  Physical activity G. Advance directives H. List of other physicians I.  Hospitalizations, surgeries, and ER visits in previous 12 months J.  Vitals K. Screenings such as hearing and vision if needed, cognitive and depression L. Referrals and appointments - none  In addition, I have reviewed and discussed with patient certain preventive protocols, quality metrics, and best practice recommendations. A written personalized care plan for preventive services as well as general preventive health recommendations were provided to patient.  See attached scanned questionnaire for additional information.   Signed,  Fabio Neighbors, LPN Nurse Health Advisor   MD Recommendations: None. Pt declined tetanus vaccine today.

## 2016-09-17 NOTE — Patient Instructions (Signed)
Jade Gardner , Thank you for taking time to come for your Medicare Wellness Visit. I appreciate your ongoing commitment to your health goals. Please review the following plan we discussed and let me know if I can assist you in the future.   Screening recommendations/referrals: Colonoscopy: completed 07/03/09, due 07/2019 Mammogram: completed 09/05/15, due 08/2016 Bone Density: completed 08/29/14 Recommended yearly ophthalmology/optometry visit for glaucoma screening and checkup Recommended yearly dental visit for hygiene and checkup  Vaccinations: Influenza vaccine: due 10/2016 Pneumococcal vaccine: completed series Tdap vaccine: declined Shingles vaccine: declined  Advanced directives: Please bring a copy of your POA (Power of Attorney) and/or Living Will to your next appointment.   Conditions/risks identified: Recommend to start exercising 3 times a week for 30 minutes. Pt to start in the fall.   Next appointment: None, need to schedule 1 year AWV.   Preventive Care 11 Years and Older, Female Preventive care refers to lifestyle choices and visits with your health care provider that can promote health and wellness. What does preventive care include?  A yearly physical exam. This is also called an annual well check.  Dental exams once or twice a year.  Routine eye exams. Ask your health care provider how often you should have your eyes checked.  Personal lifestyle choices, including:  Daily care of your teeth and gums.  Regular physical activity.  Eating a healthy diet.  Avoiding tobacco and drug use.  Limiting alcohol use.  Practicing safe sex.  Taking low-dose aspirin every day.  Taking vitamin and mineral supplements as recommended by your health care provider. What happens during an annual well check? The services and screenings done by your health care provider during your annual well check will depend on your age, overall health, lifestyle risk factors, and  family history of disease. Counseling  Your health care provider may ask you questions about your:  Alcohol use.  Tobacco use.  Drug use.  Emotional well-being.  Home and relationship well-being.  Sexual activity.  Eating habits.  History of falls.  Memory and ability to understand (cognition).  Work and work Statistician.  Reproductive health. Screening  You may have the following tests or measurements:  Height, weight, and BMI.  Blood pressure.  Lipid and cholesterol levels. These may be checked every 5 years, or more frequently if you are over 10 years old.  Skin check.  Lung cancer screening. You may have this screening every year starting at age 42 if you have a 30-pack-year history of smoking and currently smoke or have quit within the past 15 years.  Fecal occult blood test (FOBT) of the stool. You may have this test every year starting at age 25.  Flexible sigmoidoscopy or colonoscopy. You may have a sigmoidoscopy every 5 years or a colonoscopy every 10 years starting at age 21.  Hepatitis C blood test.  Hepatitis B blood test.  Sexually transmitted disease (STD) testing.  Diabetes screening. This is done by checking your blood sugar (glucose) after you have not eaten for a while (fasting). You may have this done every 1-3 years.  Bone density scan. This is done to screen for osteoporosis. You may have this done starting at age 81.  Mammogram. This may be done every 1-2 years. Talk to your health care provider about how often you should have regular mammograms. Talk with your health care provider about your test results, treatment options, and if necessary, the need for more tests. Vaccines  Your health care provider may recommend  certain vaccines, such as:  Influenza vaccine. This is recommended every year.  Tetanus, diphtheria, and acellular pertussis (Tdap, Td) vaccine. You may need a Td booster every 10 years.  Zoster vaccine. You may need this  after age 19.  Pneumococcal 13-valent conjugate (PCV13) vaccine. One dose is recommended after age 17.  Pneumococcal polysaccharide (PPSV23) vaccine. One dose is recommended after age 50. Talk to your health care provider about which screenings and vaccines you need and how often you need them. This information is not intended to replace advice given to you by your health care provider. Make sure you discuss any questions you have with your health care provider. Document Released: 03/15/2015 Document Revised: 11/06/2015 Document Reviewed: 12/18/2014 Elsevier Interactive Patient Education  2017 [place] Prevention in the Home Falls can cause injuries. They can happen to people of all ages. There are many things you can do to make your home safe and to help prevent falls. What can I do on the outside of my home?  Regularly fix the edges of walkways and driveways and fix any cracks.  Remove anything that might make you trip as you walk through a door, such as a raised step or threshold.  Trim any bushes or trees on the path to your home.  Use bright outdoor lighting.  Clear any walking paths of anything that might make someone trip, such as rocks or tools.  Regularly check to see if handrails are loose or broken. Make sure that both sides of any steps have handrails.  Any raised decks and porches should have guardrails on the edges.  Have any leaves, snow, or ice cleared regularly.  Use sand or salt on walking paths during winter.  Clean up any spills in your garage right away. This includes oil or grease spills. What can I do in the bathroom?  Use night lights.  Install grab bars by the toilet and in the tub and shower. Do not use towel bars as grab bars.  Use non-skid mats or decals in the tub or shower.  If you need to sit down in the shower, use a plastic, non-slip stool.  Keep the floor dry. Clean up any water that spills on the floor as soon as it  happens.  Remove soap buildup in the tub or shower regularly.  Attach bath mats securely with double-sided non-slip rug tape.  Do not have throw rugs and other things on the floor that can make you trip. What can I do in the bedroom?  Use night lights.  Make sure that you have a light by your bed that is easy to reach.  Do not use any sheets or blankets that are too big for your bed. They should not hang down onto the floor.  Have a firm chair that has side arms. You can use this for support while you get dressed.  Do not have throw rugs and other things on the floor that can make you trip. What can I do in the kitchen?  Clean up any spills right away.  Avoid walking on wet floors.  Keep items that you use a lot in easy-to-reach places.  If you need to reach something above you, use a strong step stool that has a grab bar.  Keep electrical cords out of the way.  Do not use floor polish or wax that makes floors slippery. If you must use wax, use non-skid floor wax.  Do not have throw rugs and other  things on the floor that can make you trip. What can I do with my stairs?  Do not leave any items on the stairs.  Make sure that there are handrails on both sides of the stairs and use them. Fix handrails that are broken or loose. Make sure that handrails are as long as the stairways.  Check any carpeting to make sure that it is firmly attached to the stairs. Fix any carpet that is loose or worn.  Avoid having throw rugs at the top or bottom of the stairs. If you do have throw rugs, attach them to the floor with carpet tape.  Make sure that you have a light switch at the top of the stairs and the bottom of the stairs. If you do not have them, ask someone to add them for you. What else can I do to help prevent falls?  Wear shoes that:  Do not have high heels.  Have rubber bottoms.  Are comfortable and fit you well.  Are closed at the toe. Do not wear sandals.  If you  use a stepladder:  Make sure that it is fully opened. Do not climb a closed stepladder.  Make sure that both sides of the stepladder are locked into place.  Ask someone to hold it for you, if possible.  Clearly mark and make sure that you can see:  Any grab bars or handrails.  First and last steps.  Where the edge of each step is.  Use tools that help you move around (mobility aids) if they are needed. These include:  Canes.  Walkers.  Scooters.  Crutches.  Turn on the lights when you go into a dark area. Replace any light bulbs as soon as they burn out.  Set up your furniture so you have a clear path. Avoid moving your furniture around.  If any of your floors are uneven, fix them.  If there are any pets around you, be aware of where they are.  Review your medicines with your doctor. Some medicines can make you feel dizzy. This can increase your chance of falling. Ask your doctor what other things that you can do to help prevent falls. This information is not intended to replace advice given to you by your health care provider. Make sure you discuss any questions you have with your health care provider. Document Released: 12/13/2008 Document Revised: 07/25/2015 Document Reviewed: 03/23/2014 Elsevier Interactive Patient Education  2017 [employer].

## 2016-09-17 NOTE — Progress Notes (Deleted)
Patient: Jade Gardner, Female    DOB: [birth year], 71 y.o.   MRN: [id number] Visit Date: 09/17/2016  Today's Provider: Vernie Murders, PA   Chief Complaint  Patient presents with  . Medicare Wellness   Subjective:    Annual wellness visit Jade Gardner is a 71 y.o. female who presents today for her Subsequent Annual Wellness Visit. She feels {DESC; WELL/FAIRLY WELL/POORLY:18703}. She reports exercising ***. She reports she is sleeping {DESC; WELL/FAIRLY WELL/POORLY:18703}.  -----------------------------------------------------------   Review of Systems  Constitutional: Negative.   HENT: Negative.   Eyes: Negative.   Respiratory: Negative.   Cardiovascular: Negative.   Gastrointestinal: Negative.   Endocrine: Negative.   Genitourinary: Negative.   Musculoskeletal: Negative.   Skin: Negative.   Allergic/Immunologic: Negative.   Neurological: Negative.   Hematological: Negative.   Psychiatric/Behavioral: Negative.     Social History   Social History  . Marital status: Widowed    Spouse name: N/A  . Number of children: 2  . Years of education: HS Grad   Occupational History  . Retired     [employer]   Social History Main Topics  . Smoking status: Never Smoker  . Smokeless tobacco: Never Used  . Alcohol use No  . Drug use: No  . Sexual activity: Not on file   Other Topics Concern  . Not on file   Social History Narrative  . No narrative on file    Patient Active Problem List   Diagnosis Date Noted  . Hypertension 04/27/2016  . Hyperlipidemia 11/28/2015  . Hay fever 05/29/2015  . NASH (nonalcoholic steatohepatitis) 05/29/2015  . H/O transient cerebral ischemia 05/29/2015  . Personal history of other diseases of the musculoskeletal system and connective tissue 05/29/2015  . Bronchitis, mucopurulent recurrent ([place]) 05/29/2015  . Diverticulosis 08/24/2014  . Fibrocystic breast disease 08/24/2014  . Acquired sideroblastic anemia 08/23/2014  .  Allergic rhinitis 08/23/2014  . DDD (degenerative disc disease), thoracolumbar 08/23/2014  . Abnormal liver enzymes 08/23/2014  . Hearing loss 08/23/2014  . Insomnia 08/23/2014  . Osteopenia 08/23/2014  . Overweight (BMI 25.0-29.9) 08/23/2014  . Pre-diabetes 08/23/2014  . Obstructive sleep apnea 08/23/2014  . Varicose veins of both lower extremities without ulcer or inflammation 08/23/2014  . B12 deficiency 08/23/2014  . Vitamin D deficiency 08/23/2014  . Carotid stenosis 09/15/2013  . Hypertension, essential, benign 05/26/2011  . SVT (supraventricular tachycardia) ([place]) 05/26/2011  . Nonalcoholic steatohepatitis ([facility]) 05/20/2009  . Hypercholesterolemia without hypertriglyceridemia 05/20/2009  . Pure hypercholesterolemia 05/20/2009  . Diaphragmatic hernia 08/10/2008  . Cardiac conduction disorder 08/10/2008  . Benign essential HTN 08/10/2008  . GERD (gastroesophageal reflux disease) 08/10/2008    Past Surgical History:  Procedure Laterality Date  . APPENDECTOMY  1954  . CARDIAC CATHETERIZATION  1030&[id number]  . CHOLECYSTECTOMY  1981    Her family history includes Dementia in her father; Heart disease in her brother, father, and mother; Stroke in her unknown relative.     Previous Medications   ALBUTEROL (PROVENTIL HFA;VENTOLIN HFA) 108 (90 BASE) MCG/ACT INHALER    Inhale 1-2 puffs into the lungs every 6 (six) hours as needed for wheezing or shortness of breath.   AMLODIPINE (NORVASC) 10 MG TABLET    Take 1 tablet (10 mg total) by mouth daily.   ASPIRIN 81 MG TABLET    Take 81 mg by mouth daily.   BUDESONIDE-FORMOTEROL (SYMBICORT) 160-4.5 MCG/ACT INHALER    Inhale 2 puffs into the lungs 2 (two) times daily.   CALCIUM CARBONATE-VITAMIN D (  CALTRATE 600+D PO)    Take by mouth daily.    CEFDINIR (OMNICEF) 300 MG CAPSULE    Take 1 capsule (300 mg total) by mouth 2 (two) times daily.   CHOLECALCIFEROL (VITAMIN D) 2000 UNITS TABLET    Take 2,000 Units by mouth daily.   CLOPIDOGREL  (PLAVIX) 75 MG TABLET    TAKE (1) TABLET BY MOUTH EVERY DAY WITH BREAKFAST   CYANOCOBALAMIN (,VITAMIN B-12,) 1000 MCG/ML INJECTION    INJECT 1 ML INTRAMUSCULARLY MONTHLY.   CYCLOBENZAPRINE (FLEXERIL) 10 MG TABLET    Take 0.5-1 tablets (5-10 mg total) by mouth at bedtime as needed for muscle spasms.   EZETIMIBE (ZETIA) 10 MG TABLET    Take 1 tablet (10 mg total) by mouth daily.   FLUTICASONE (FLONASE) 50 MCG/ACT NASAL SPRAY    Place 2 sprays into both nostrils daily.   IBANDRONATE (BONIVA) 150 MG TABLET    TAKE 1 TABLET BY MOUTH MONTHLY- DRINK WITH FULL GLASS OF WATER, ON AN EMPTY STOMACH AND WITH NO OTHER FOOD/DRINK/MEDICATION   IBUPROFEN (ADVIL,MOTRIN) 200 MG TABLET    Take 200 mg by mouth every 6 (six) hours as needed.   LORATADINE (CLARITIN) 10 MG TABLET    Take 10 mg by mouth daily.   LOVASTATIN (MEVACOR) 20 MG TABLET    Take 1 tablet (20 mg total) by mouth at bedtime.   METOPROLOL SUCCINATE (TOPROL-XL) 50 MG 24 HR TABLET    Take 1 1/2 tablet by mouth daily.   MONTELUKAST (SINGULAIR) 10 MG TABLET    Take 1 tablet (10 mg total) by mouth at bedtime.   ONDANSETRON (ZOFRAN ODT) 4 MG DISINTEGRATING TABLET    Take 1 tablet (4 mg total) by mouth every 6 (six) hours as needed for nausea or vomiting.   PANTOPRAZOLE (PROTONIX) 40 MG TABLET    Take 1 tablet (40 mg total) by mouth daily.   VITAMIN C (ASCORBIC ACID) 500 MG TABLET    Take 500 mg by mouth daily.   VITAMIN E 1000 UNIT CAPSULE    Take 1,000 Units by mouth daily.    Patient Care Team: Chrismon, Vickki Muff, PA as PCP - General (Family Medicine)      Objective:   Vitals: There were no vitals taken for this visit.  Physical Exam  Activities of Daily Living No flowsheet data found.  Fall Risk Assessment Fall Risk  09/05/2015 08/23/2014  Falls in the past year? No No     Depression Screen PHQ 2/9 Scores 09/05/2015 08/23/2014  PHQ - 2 Score 2 0  PHQ- 9 Score 5 -    Cognitive Testing - 6-CIT  Correct? Score   What year is it? {yes  no:22349} {0-4:31231} 0 or 4  What month is it? {yes no:22349} {0-3:21082} 0 or 3  Memorize:    Pia Mau,  41,  [place],      What time is it? (within 1 hour) {yes no:22349} {0-3:21082} 0 or 3  Count backwards from 20 {yes no:22349} {0-4:31231} 0, 2, or 4  Name the months of the year {yes no:22349} {0-4:31231} 0, 2, or 4  Repeat name & address above {yes no:22349} {0-10:5044} 0, 2, 4, 6, 8, or 10       TOTAL SCORE  ***/28   Interpretation:  {normal/abnormal:11317::"Normal"}  Normal (0-7) Abnormal (8-28)       Assessment & Plan:     Annual Wellness Visit  Reviewed patient's Family Medical History Reviewed and updated list  of patient's medical providers Assessment of cognitive impairment was done Assessed patient's functional ability Established a written schedule for health screening [place] Completed and Reviewed  Exercise Activities and Dietary recommendations Goals    None      Immunization History  Administered Date(s) Administered  . Hep A / Hep B 12/17/2008, 01/14/2009, 06/14/2009  . Hepatitis A 12/17/2008  . Hepatitis B 12/17/2008, 01/14/2009, 06/14/2009, 06/14/2009  . Influenza Split 01/07/2009, 12/12/2009, 12/24/2011  . Influenza,inj,Quad PF,36+ Mos 12/30/2012, 11/08/2013, 03/06/2015  . Influenza-Unspecified 12/31/2012, 11/08/2013, 11/29/2015  . Pneumococcal Conjugate-13 01/18/2014  . Pneumococcal Polysaccharide-23 08/12/2011    Health Maintenance  Topic Date Due  . TETANUS/TDAP  03/08/1963  . INFLUENZA VACCINE  09/30/2016  . MAMMOGRAM  09/04/2017  . COLONOSCOPY  07/04/2019  . DEXA SCAN  Completed  . Hepatitis C Screening  Completed  . PNA vac Low Risk Adult  Completed     Discussed health benefits of physical activity, and encouraged her to engage in regular exercise appropriate for her age and condition.     ------------------------------------------------------------------------------------------------------------

## 2016-09-18 ENCOUNTER — Telehealth: Payer: Self-pay

## 2016-09-18 LAB — COMPREHENSIVE METABOLIC PANEL
ALBUMIN: 4.6 g/dL (ref 3.5–4.8)
ALK PHOS: 78 IU/L (ref 39–117)
ALT: 36 IU/L — ABNORMAL HIGH (ref 0–32)
AST: 55 IU/L — ABNORMAL HIGH (ref 0–40)
Albumin/Globulin Ratio: 1.8 (ref 1.2–2.2)
BUN / CREAT RATIO: 12 (ref 12–28)
BUN: 12 mg/dL (ref 8–27)
Bilirubin Total: 0.7 mg/dL (ref 0.0–1.2)
CALCIUM: 10.2 mg/dL (ref 8.7–10.3)
CO2: 20 mmol/L (ref 20–29)
CREATININE: 0.97 mg/dL (ref 0.57–1.00)
Chloride: 102 mmol/L (ref 96–106)
GFR calc Af Amer: 67 mL/min/{1.73_m2} (ref >59)
GFR calc non Af Amer: 59 mL/min/{1.73_m2} — ABNORMAL LOW (ref >59)
GLOBULIN, TOTAL: 2.6 g/dL (ref 1.5–4.5)
Glucose: 91 mg/dL (ref 65–99)
Potassium: 4.4 mmol/L (ref 3.5–5.2)
SODIUM: 141 mmol/L (ref 134–144)
Total Protein: 7.2 g/dL (ref 6.0–8.5)

## 2016-09-18 LAB — CBC WITH DIFFERENTIAL/PLATELET
Basophils Absolute: 0 10*3/uL (ref 0.0–0.2)
Basos: 1 %
EOS (ABSOLUTE): 0.4 10*3/uL (ref 0.0–0.4)
EOS: 7 %
HEMATOCRIT: 42.6 % (ref 34.0–46.6)
HEMOGLOBIN: 13.6 g/dL (ref 11.1–15.9)
IMMATURE GRANS (ABS): 0 10*3/uL (ref 0.0–0.1)
IMMATURE GRANULOCYTES: 0 %
LYMPHS: 39 %
Lymphocytes Absolute: 2.3 10*3/uL (ref 0.7–3.1)
MCH: 29.4 pg (ref 26.6–33.0)
MCHC: 31.9 g/dL (ref 31.5–35.7)
MCV: 92 fL (ref 79–97)
MONOCYTES: 8 %
Monocytes Absolute: 0.5 10*3/uL (ref 0.1–0.9)
NEUTROS PCT: 45 %
Neutrophils Absolute: 2.7 10*3/uL (ref 1.4–7.0)
Platelets: 239 10*3/uL (ref 150–379)
RBC: 4.62 x10E6/uL (ref 3.77–5.28)
RDW: 14.2 % (ref 12.3–15.4)
WBC: 6 10*3/uL (ref 3.4–10.8)

## 2016-09-18 LAB — LIPID PANEL
CHOLESTEROL TOTAL: 176 mg/dL (ref 100–199)
Chol/HDL Ratio: 3.8 ratio (ref 0.0–4.4)
HDL: 46 mg/dL (ref >39)
LDL CALC: 95 mg/dL (ref 0–99)
TRIGLYCERIDES: 176 mg/dL — AB (ref 0–149)
VLDL Cholesterol Cal: 35 mg/dL (ref 5–40)

## 2016-09-18 LAB — TSH: TSH: 5.29 u[IU]/mL — ABNORMAL HIGH (ref 0.450–4.500)

## 2016-09-18 LAB — VITAMIN D 25 HYDROXY (VIT D DEFICIENCY, FRACTURES): VIT D 25 HYDROXY: 25.1 ng/mL — AB (ref 30.0–100.0)

## 2016-09-18 LAB — VITAMIN B12: VITAMIN B 12: 328 pg/mL (ref 232–1245)

## 2016-09-18 MED ORDER — IBANDRONATE SODIUM 150 MG PO TABS: ORAL_TABLET | ORAL | 12 refills | Status: DC

## 2016-09-18 NOTE — Telephone Encounter (Signed)
Patient advised.

## 2016-09-18 NOTE — Telephone Encounter (Signed)
lmtcb

## 2016-09-18 NOTE — Telephone Encounter (Signed)
-----   Message from [place], [place] sent at 09/18/2016  9:29 AM EDT ----- X-ray of foot shows arthritis and diffuse osteopenia (bone thinning). Need to use Ibuprofen prn inflammation and discomfort. Also, need to stay on the Boniva to keep bones from getting worse with thinning.

## 2016-09-18 NOTE — Telephone Encounter (Signed)
-----   Message from [place], [place] sent at 09/18/2016  9:29 AM EDT ----- X-ray of foot shows arthritis and diffuse osteopenia (bone thinning). Need to use Ibuprofen prn inflammation and discomfort. Also, need to stay on the Boniva to keep bones from getting worse with thinning.

## 2016-09-18 NOTE — Telephone Encounter (Signed)
Pt advised and Boniva sent to pharmacy.

## 2016-09-21 ENCOUNTER — Telehealth: Payer: Self-pay

## 2016-09-21 ENCOUNTER — Telehealth: Payer: Self-pay | Admitting: Cardiovascular Disease

## 2016-09-21 MED ORDER — VITAMIN B-12 1000 MCG PO TABS: 1000.0000 ug | ORAL_TABLET | Freq: Every day | ORAL | Status: DC

## 2016-09-21 MED ORDER — VITAMIN D 1000 UNITS PO TABS: 1000.0000 [IU] | ORAL_TABLET | Freq: Every day | ORAL | Status: DC

## 2016-09-21 NOTE — Telephone Encounter (Signed)
Spoke w/ pt. She reports that she developed rt leg swelling approx 2 wks ago. She saw her PCP who mentioned that she may have varicose veins. She saw him again on Thurs and requested an x-ray to make sure she did not have a fracture. X-ray showed arthritis & osteopenia. Swelling worsened on Fri & Sat, also affecting her left leg.  She reports that it now hurts to walk.  She has been wearing compression hose; she does have have fluid pill. Pt was started on amlodipine by her PCP in April 2018. Recommended that she hold amlodipine for a few days to see if sx improved. Stressed the importance of monitoring her BP while off of this and to call if readings start to creep up. She is appreciative and will call back in a few days to let us know how she is doing.

## 2016-09-21 NOTE — Telephone Encounter (Signed)
Swelling in the legs, ankle and foot Started in right leg, now left is showing problems Going on for 2 weeks Xray done by PCP, showed arthritis She is just concerned and would like to make sure it is not something more serious Please call and advise

## 2016-09-21 NOTE — Telephone Encounter (Signed)
Pt advised of results and agrees with treatment plan. Vitamin D and B12 added to med list.

## 2016-09-21 NOTE — Telephone Encounter (Signed)
-----   Message from [place]o [place], [place] sent at 09/18/2016  5:51 PM EDT ----- Vitamin D is slightly low and B12 levels are normal. Continue Vitamin-D 1000 IU qd and add Vitamin-B12 1000 IU tablet daily. TSH is slightly elevated. Will ask lab to run T3 and T4 levels to see if this is a normal fluctuation. Cholesterol is much better. Continue Zetia and Lovastatin daily. Recheck pending final thyroid results.

## 2017-03-04 ENCOUNTER — Ambulatory Visit (INDEPENDENT_AMBULATORY_CARE_PROVIDER_SITE_OTHER): Payer: Medicare Other | Admitting: Family Medicine

## 2017-03-04 ENCOUNTER — Encounter: Payer: Self-pay | Admitting: Family Medicine

## 2017-03-04 VITALS — BP 128/68 | HR 61 | Temp 98.3°F | Wt 158.0 lb

## 2017-03-04 DIAGNOSIS — J029 Acute pharyngitis, unspecified: Secondary | ICD-10-CM

## 2017-03-04 DIAGNOSIS — J069 Acute upper respiratory infection, unspecified: Secondary | ICD-10-CM | POA: Diagnosis not present

## 2017-03-04 LAB — POCT RAPID STREP A (OFFICE): RAPID STREP A SCREEN: NEGATIVE

## 2017-03-04 MED ORDER — PSEUDOEPH-BROMPHEN-DM 30-2-10 MG/5ML PO SYRP: 5.0000 mL | ORAL_SOLUTION | Freq: Four times a day (QID) | ORAL | 0 refills | Status: DC | PRN

## 2017-03-04 NOTE — Progress Notes (Signed)
Patient: Jade Gardner Female    DOB: 01/16/1946   72 y.o.   MRN: [id number] Visit Date: 03/04/2017  Today's Provider: Vernie Murders, PA   Chief Complaint  Patient presents with  . Sore Throat   Subjective:    Sore Throat   This is a new problem. Episode onset: 2 days ago. The problem has been gradually worsening. Neither side of throat is experiencing more pain than the other. There has been no fever. Associated symptoms include coughing. Associated symptoms comments: Body aches . She has tried gargles for the symptoms. The treatment provided mild relief.   Past Medical History:  Diagnosis Date  . Diverticulosis   . Hypercalcemia    Secondar to calcium supplements. Normal PTH  . Pneumonia   . SVT (supraventricular tachycardia) (HCC)    history of  . TIA (transient ischemic attack) 2010   Negative carotids.   Marland Kitchen UTI (urinary tract infection)    Past Surgical History:  Procedure Laterality Date  . APPENDECTOMY  1954  . CARDIAC CATHETERIZATION  [id number]  . CHOLECYSTECTOMY  1981   Family History  Problem Relation Age of Onset  . Heart disease Father   . Dementia Father   . Heart disease Mother   . Heart disease Brother   . Stroke Unknown    Allergies  Allergen Reactions  . Fish Oil Itching    Other reaction(s): Other (See Comments) Burning and stinging  . Simvastatin     Other reaction(s): Muscle Pain Other reaction(s): Other (See Comments) Joint pain    Current Outpatient Medications:  .  aspirin 81 MG tablet, Take 81 mg by mouth daily., Disp: , Rfl:  .  cholecalciferol (VITAMIN D) 1000 units tablet, Take 1 tablet (1,000 Units total) by mouth daily., Disp: , Rfl:  .  clopidogrel (PLAVIX) 75 MG tablet, TAKE (1) TABLET BY MOUTH EVERY DAY WITH BREAKFAST, Disp: 90 tablet, Rfl: 3 .  cyclobenzaprine (FLEXERIL) 10 MG tablet, Take 0.5-1 tablets (5-10 mg total) by mouth at bedtime as needed for muscle spasms., Disp: 90 tablet, Rfl: 1 .  ezetimibe (ZETIA) 10 MG  tablet, Take 1 tablet (10 mg total) by mouth daily., Disp: 90 tablet, Rfl: 3 .  fluticasone (FLONASE) 50 MCG/ACT nasal spray, Place 2 sprays into both nostrils daily. (Patient taking differently: Place 2 sprays into both nostrils daily. ), Disp: 16 g, Rfl: 2 .  ibandronate (BONIVA) 150 MG tablet, TAKE 1 TABLET BY MOUTH MONTHLY- DRINK WITH FULL GLASS OF WATER, ON AN EMPTY STOMACH AND WITH NO OTHER FOOD/DRINK/MEDICATION, Disp: 1 tablet, Rfl: 12 .  ibuprofen (ADVIL,MOTRIN) 200 MG tablet, Take 200 mg by mouth every 6 (six) hours as needed., Disp: , Rfl:  .  loratadine (CLARITIN) 10 MG tablet, Take 10 mg by mouth daily., Disp: , Rfl:  .  lovastatin (MEVACOR) 20 MG tablet, Take 1 tablet (20 mg total) by mouth at bedtime., Disp: 90 tablet, Rfl: 3 .  metoprolol succinate (TOPROL-XL) 50 MG 24 hr tablet, Take 1 1/2 tablet by mouth daily., Disp: 135 tablet, Rfl: 3 .  montelukast (SINGULAIR) 10 MG tablet, Take 1 tablet (10 mg total) by mouth at bedtime., Disp: 90 tablet, Rfl: 3 .  ondansetron (ZOFRAN ODT) 4 MG disintegrating tablet, Take 1 tablet (4 mg total) by mouth every 6 (six) hours as needed for nausea or vomiting., Disp: 20 tablet, Rfl: 0 .  pantoprazole (PROTONIX) 40 MG tablet, Take 1 tablet (40 mg total) by mouth daily., Disp: 90  tablet, Rfl: 3 .  vitamin E 1000 UNIT capsule, Take 1,000 Units by mouth daily., Disp: , Rfl:  .  vitamin B-12 (CYANOCOBALAMIN) 1000 MCG tablet, Take 1 tablet (1,000 mcg total) by mouth daily. (Patient not taking: Reported on 03/04/2017), Disp: , Rfl:   Current Facility-Administered Medications:  .  cyanocobalamin ((VITAMIN B-12)) injection 1,000 mcg, 1,000 mcg, Intramuscular, Q30 days, Birdie Sons, MD, 1,000 mcg at 08/23/14 1032  Review of Systems  Constitutional: Negative.   Respiratory: Positive for cough.   Cardiovascular: Negative.   Musculoskeletal: Positive for myalgias.    Social History   Tobacco Use  . Smoking status: Never Smoker  . Smokeless tobacco:  Never Used  Substance Use Topics  . Alcohol use: No   Objective:   BP 128/68 (BP Location: Left Arm, Patient Position: Sitting, Cuff Size: Normal)   Pulse 61   Temp 98.3 F (36.8 C) (Oral)   Wt 158 lb (71.7 kg)   SpO2 97%   BMI 27.99 kg/m    Physical Exam  Constitutional: She is oriented to person, place, and time. She appears well-developed and well-nourished. No distress.  HENT:  Head: Normocephalic and atraumatic.  Right Ear: Hearing and external ear normal.  Left Ear: Hearing and external ear normal.  Nose: Nose normal.  Mouth/Throat: Oropharynx is clear and moist.  Slight cobblestoning of posterior pharynx without exudates.  Eyes: Conjunctivae and lids are normal. Right eye exhibits no discharge. Left eye exhibits no discharge. No scleral icterus.  Neck: Neck supple.  Cardiovascular: Normal rate and regular rhythm.  Pulmonary/Chest: Effort normal and breath sounds normal. No respiratory distress.  Abdominal: Soft. Bowel sounds are normal.  Musculoskeletal: Normal range of motion.  Lymphadenopathy:    She has no cervical adenopathy.  Neurological: She is alert and oriented to person, place, and time.  Skin: Skin is intact. No lesion and no rash noted.  Psychiatric: She has a normal mood and affect. Her speech is normal and behavior is normal. Thought content normal.      Assessment & Plan:     1. Sore throat Onset over the past 2 days with a little congestion and cough. Strep test negative. May use saltwater gargles or add Maalox 1 tsp with Bromfed-DM to gargle then swallow PC and HS. Recheck prn. - POCT rapid strep A  2. URI with cough and congestion Onset with sorethroat. Cough non-productive and no fever. May use Tylenol or Advil prn and treat with Bromfed-DM. Recheck prn. - brompheniramine-pseudoephedrine-DM 30-2-10 MG/5ML syrup; Take 5 mLs by mouth 4 (four) times daily as needed.  Dispense: 120 mL; Refill: 0       Vernie Murders, PA  [facility]

## 2017-03-04 NOTE — Patient Instructions (Signed)
Sore Throat A sore throat is pain, burning, irritation, or scratchiness in the throat. When you have a sore throat, you may feel pain or tenderness in your throat when you swallow or talk. Many things can cause a sore throat, including:  An infection.  Seasonal allergies.  Dryness in the air.  Irritants, such as smoke or pollution.  Gastroesophageal reflux disease (GERD).  A tumor.  A sore throat is often the first sign of another sickness. It may happen with other symptoms, such as coughing, sneezing, fever, and swollen neck glands. Most sore throats go away without medical treatment. Follow these instructions at home:  Take over-the-counter medicines only as told by your health care provider.  Drink enough fluids to keep your urine clear or pale yellow.  Rest as needed.  To help with pain, try: ? Sipping warm liquids, such as broth, herbal tea, or warm water. ? Eating or drinking cold or frozen liquids, such as frozen ice pops. ? Gargling with a salt-water mixture 3-4 times a day or as needed. To make a salt-water mixture, completely dissolve -1 tsp of salt in 1 cup of warm water. ? Sucking on hard candy or throat lozenges. ? Putting a cool-mist humidifier in your bedroom at night to moisten the air. ? Sitting in the bathroom with the door closed for 5-10 minutes while you run hot water in the shower.  Do not use any tobacco products, such as cigarettes, chewing tobacco, and e-cigarettes. If you need help quitting, ask your health care provider. Contact a health care provider if:  You have a fever for more than 2-3 days.  You have symptoms that last (are persistent) for more than 2-3 days.  Your throat does not get better within 7 days.  You have a fever and your symptoms suddenly get worse. Get help right away if:  You have difficulty breathing.  You cannot swallow fluids, soft foods, or your saliva.  You have increased swelling in your throat or neck.  You have  persistent nausea and vomiting. This information is not intended to replace advice given to you by your health care provider. Make sure you discuss any questions you have with your health care provider. Document Released: 03/26/2004 Document Revised: 10/13/2015 Document Reviewed: 12/07/2014 Elsevier Interactive Patient Education  [employer].

## 2017-03-07 ENCOUNTER — Other Ambulatory Visit: Payer: Self-pay | Admitting: Cardiovascular Disease

## 2017-03-08 ENCOUNTER — Telehealth: Payer: Self-pay | Admitting: Family Medicine

## 2017-03-08 ENCOUNTER — Ambulatory Visit: Payer: Medicare Other | Admitting: Family Medicine

## 2017-03-08 NOTE — Telephone Encounter (Signed)
Patient was seen on 03/04/2016 and she states that she is feeling worse.  She states that she is now coughing, bad headache, body aches, no fever, has drainage.  She has an appointment today at 4 if you want to see her.  She wanted me to send a message back to see if you just want to call her in something.  Cancel appointment if not necessary.   She uses [facility] in [email or web address].I

## 2017-03-08 NOTE — Telephone Encounter (Signed)
Patient called and canceled appointment before contacting her. She states she is felling better this afternoon.

## 2017-03-08 NOTE — Telephone Encounter (Signed)
Proceed with evaluation in the office today.

## 2017-03-12 DIAGNOSIS — H2513 Age-related nuclear cataract, bilateral: Secondary | ICD-10-CM | POA: Diagnosis not present

## 2017-04-19 ENCOUNTER — Other Ambulatory Visit: Payer: Self-pay

## 2017-04-19 ENCOUNTER — Telehealth: Payer: Self-pay | Admitting: Emergency Medicine

## 2017-04-19 ENCOUNTER — Ambulatory Visit
Admission: EM | Admit: 2017-04-19 | Discharge: 2017-04-19 | Disposition: A | Discharge status: Home / Self Care | Payer: Medicare Other | Attending: Family Medicine | Admitting: Family Medicine

## 2017-04-19 ENCOUNTER — Encounter: Payer: Self-pay | Admitting: Emergency Medicine

## 2017-04-19 DIAGNOSIS — H1032 Unspecified acute conjunctivitis, left eye: Secondary | ICD-10-CM

## 2017-04-19 DIAGNOSIS — J029 Acute pharyngitis, unspecified: Secondary | ICD-10-CM | POA: Diagnosis not present

## 2017-04-19 MED ORDER — POLYMYXIN B-TRIMETHOPRIM 10000-0.1 UNIT/ML-% OP SOLN: 1.0000 [drp] | Freq: Four times a day (QID) | OPHTHALMIC | 0 refills | Status: AC

## 2017-04-19 NOTE — Telephone Encounter (Signed)
Patient called stating that she was seen this morning for conjunctivitis. She states that her BP was elevated at the clinic because she had not taken her BP medication. Patient states that she went home and took her BP med and took Advil. BP 197/95 and rechecked at 188/91 at home. Patient is c/o headache and chills. Spoke with Dr. Lacinda Axon and he advises for patient to go to ED. I advised patient of his recommendation and she agreed and voiced understanding.

## 2017-04-19 NOTE — Discharge Instructions (Signed)
Use the eye drop as prescribed.  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  Take care  Dr. Lacinda Axon

## 2017-04-19 NOTE — ED Provider Notes (Signed)
[facility]    CSN: [id number] Arrival date & time: 04/19/17  0801  History   Chief Complaint Chief Complaint  Patient presents with  . Eye Pain   HPI  72 year old female presents with the above complaint.  Patient reports that she developed left eye redness, associated pain, and drainage as of yesterday.  She states that her eye was nearly crusted shut this morning.  She does volunteer at a school.  No reported sick contacts.  No known exacerbating or relieving factors.  Additionally, patient reports that she has had sore throat since this morning.  She states that it is on the left side.  No medications or interventions tried.  No known exacerbating relieving factors.  No other complaints or concerns at this time.  Past Medical History:  Diagnosis Date  . Diverticulosis   . Hypercalcemia    Secondar to calcium supplements. Normal PTH  . Pneumonia   . SVT (supraventricular tachycardia) (HCC)    history of  . TIA (transient ischemic attack) 2010   Negative carotids.   Marland Kitchen UTI (urinary tract infection)     Patient Active Problem List   Diagnosis Date Noted  . H/O transient cerebral ischemia 05/29/2015  . Personal history of other diseases of the musculoskeletal system and connective tissue 05/29/2015  . Bronchitis, mucopurulent recurrent ([place]) 05/29/2015  . Diverticulosis 08/24/2014  . Fibrocystic breast disease 08/24/2014  . Acquired sideroblastic anemia 08/23/2014  . Allergic rhinitis 08/23/2014  . DDD (degenerative disc disease), thoracolumbar 08/23/2014  . Abnormal liver enzymes 08/23/2014  . Hearing loss 08/23/2014  . Insomnia 08/23/2014  . Osteopenia 08/23/2014  . Overweight (BMI 25.0-29.9) 08/23/2014  . Pre-diabetes 08/23/2014  . Obstructive sleep apnea 08/23/2014  . Varicose veins of both lower extremities without ulcer or inflammation 08/23/2014  . B12 deficiency 08/23/2014  . Vitamin D deficiency 08/23/2014  . Carotid stenosis 09/15/2013  .  Hypertension, essential, benign 05/26/2011  . SVT (supraventricular tachycardia) ([place]) 05/26/2011  . Nonalcoholic steatohepatitis (NASH) 05/20/2009  . Hypercholesterolemia without hypertriglyceridemia 05/20/2009  . Diaphragmatic hernia 08/10/2008  . Cardiac conduction disorder 08/10/2008  . Benign essential HTN 08/10/2008  . GERD (gastroesophageal reflux disease) 08/10/2008    Past Surgical History:  Procedure Laterality Date  . APPENDECTOMY  1954  . CARDIAC CATHETERIZATION  [id number]  . CHOLECYSTECTOMY  1981    OB History    No data available       Home Medications    Prior to Admission medications   Medication Sig Start Date End Date Taking? Authorizing Provider  Ascorbic Acid (VITAMIN C) 1000 MG tablet Take 1,000 mg by mouth daily.   Yes [provider]  aspirin 81 MG tablet Take 81 mg by mouth daily.   Yes [provider]  cholecalciferol (VITAMIN D) 1000 units tablet Take 1 tablet (1,000 Units total) by mouth daily. 09/21/16  Yes Chrismon, Vickki Muff, PA  clopidogrel (PLAVIX) 75 MG tablet TAKE (1) TABLET BY MOUTH EVERY DAY WITH BREAKFAST 06/26/16  Yes Chrismon, Vickki Muff, PA  CRANBERRY CONCENTRATE PO Take 1 tablet by mouth daily.   Yes [provider]  ezetimibe (ZETIA) 10 MG tablet Take 1 tablet (10 mg total) by mouth daily. 04/30/16  Yes Gollan, Kathlene November, MD  fluticasone (FLONASE) 50 MCG/ACT nasal spray Place 2 sprays into both nostrils daily. Patient taking differently: Place 2 sprays into both nostrils daily.  04/21/15  Yes Jan Fireman, PA-C  ibandronate (BONIVA) 150 MG tablet TAKE 1 TABLET BY MOUTH  MONTHLY- DRINK WITH FULL GLASS OF WATER, ON AN EMPTY STOMACH AND WITH NO OTHER FOOD/DRINK/MEDICATION 09/18/16  Yes Chrismon, Vickki Muff, PA  ibuprofen (ADVIL,MOTRIN) 200 MG tablet Take 200 mg by mouth every 6 (six) hours as needed.   Yes [provider]  loratadine (CLARITIN) 10 MG tablet Take 10 mg by mouth daily.   Yes [provider]    lovastatin (MEVACOR) 20 MG tablet TAKE 1 TABLET BY MOUTH AT  BEDTIME 03/08/17  Yes Gollan, Kathlene November, MD  metoprolol succinate (TOPROL-XL) 50 MG 24 hr tablet Take 1 1/2 tablet by mouth daily. 06/16/16  Yes Gollan, Kathlene November, MD  pantoprazole (PROTONIX) 40 MG tablet Take 1 tablet (40 mg total) by mouth daily. 06/26/16  Yes Chrismon, Vickki Muff, PA  cyclobenzaprine (FLEXERIL) 10 MG tablet Take 0.5-1 tablets (5-10 mg total) by mouth at bedtime as needed for muscle spasms. 06/26/16   Chrismon, Vickki Muff, PA  ondansetron (ZOFRAN ODT) 4 MG disintegrating tablet Take 1 tablet (4 mg total) by mouth every 6 (six) hours as needed for nausea or vomiting. 07/08/15   Delman Kitten, MD  trimethoprim-polymyxin b (POLYTRIM) ophthalmic solution Place 1 drop into the left eye every 6 (six) hours for 5 days. 04/19/17 04/24/17  Coral Spikes, DO    Family History Family History  Problem Relation Age of Onset  . Heart disease Father   . Dementia Father   . Heart disease Mother   . Heart disease Brother   . Stroke Unknown     Social History Social History   Tobacco Use  . Smoking status: Never Smoker  . Smokeless tobacco: Never Used  Substance Use Topics  . Alcohol use: No  . Drug use: No     Allergies   Fish oil and Simvastatin   Review of Systems Review of Systems  Constitutional: Negative.   HENT: Positive for sore throat.   Eyes: Positive for pain, discharge and redness.   Physical Exam Triage Vital Signs ED Triage Vitals [04/19/17 0814]  Enc Vitals Group     BP (!) 168/98     Pulse Rate 66     Resp 16     Temp 97.7 F (36.5 C)     Temp Source Oral     SpO2 99 %     Weight 157 lb (71.2 kg)     Height 5' 2.5" (1.588 m)     Head Circumference      Peak Flow      Pain Score 3     Pain Loc      Pain Edu?      Excl. in [place]?    Updated Vital Signs BP (!) 168/98 (BP Location: Left Arm)   Pulse 66   Temp 97.7 F (36.5 C) (Oral)   Resp 16   Ht 5' 2.5" (1.588 m)   Wt 157 lb (71.2 kg)    SpO2 99%   BMI 28.26 kg/m   Physical Exam  Constitutional: She is oriented to person, place, and time. She appears well-developed and well-nourished.  HENT:  Head: Normocephalic and atraumatic.  Mouth/Throat: Oropharynx is clear and moist.  Eyes:  Left eye with mild conjunctival injection.  Crusting noted.  Cardiovascular: Normal rate and regular rhythm.  Pulmonary/Chest: Effort normal and breath sounds normal. She has no wheezes. She has no rales.  Neurological: She is alert and oriented to person, place, and time.  Psychiatric: She has a normal mood and affect. Her behavior is normal.  Nursing note and vitals reviewed.  UC Treatments / Results  Labs (all labs ordered are listed, but only abnormal results are displayed) Labs Reviewed - No data to display  EKG  EKG Interpretation None       Radiology No results found.  Procedures Procedures (including critical care time)  Medications Ordered in UC Medications - No data to display   Initial Impression / Assessment and Plan / UC Course  I have reviewed the triage vital signs and the nursing notes.  Pertinent labs & imaging results that were available during my care of the patient were reviewed by me and considered in my medical decision making (see chart for details).    72 year old female presents with evidence of conjunctivitis.  Treating with Polytrim.  Regarding her sore throat, exam was unremarkable.  Supportive care.  Final Clinical Impressions(s) / UC Diagnoses   Final diagnoses:  Acute conjunctivitis of left eye, unspecified acute conjunctivitis type    ED Discharge Orders        Ordered    trimethoprim-polymyxin b (POLYTRIM) ophthalmic solution  Every 6 hours     04/19/17 0830     Controlled Substance Prescriptions [place] Controlled Substance Registry consulted? Not Applicable   Coral Spikes, [place] 04/19/17 [phone]

## 2017-04-19 NOTE — ED Triage Notes (Signed)
Patient in today c/o left eye pain, redness and draining since yesterday. Patient also states the left side of throat started hurting this morning.

## 2017-04-20 ENCOUNTER — Encounter: Payer: Self-pay | Admitting: Family Medicine

## 2017-04-20 ENCOUNTER — Ambulatory Visit (INDEPENDENT_AMBULATORY_CARE_PROVIDER_SITE_OTHER): Payer: Medicare Other | Admitting: Family Medicine

## 2017-04-20 VITALS — BP 154/86 | HR 76 | Temp 98.2°F | Resp 16 | Wt 153.0 lb

## 2017-04-20 DIAGNOSIS — J069 Acute upper respiratory infection, unspecified: Secondary | ICD-10-CM

## 2017-04-20 DIAGNOSIS — I10 Essential (primary) hypertension: Secondary | ICD-10-CM | POA: Diagnosis not present

## 2017-04-20 DIAGNOSIS — H1032 Unspecified acute conjunctivitis, left eye: Secondary | ICD-10-CM | POA: Diagnosis not present

## 2017-04-20 MED ORDER — HYDROCHLOROTHIAZIDE 25 MG PO TABS: 12.5000 mg | ORAL_TABLET | Freq: Every day | ORAL | 2 refills | Status: DC

## 2017-04-20 NOTE — Patient Instructions (Signed)
DASH Eating Plan DASH stands for "Dietary Approaches to Stop Hypertension." The DASH eating plan is a healthy eating plan that has been shown to reduce high blood pressure (hypertension). It may also reduce your risk for type 2 diabetes, heart disease, and stroke. The DASH eating plan may also help with weight loss. What are tips for following this plan? General guidelines  Avoid eating more than 2,300 mg (milligrams) of salt (sodium) a day. If you have hypertension, you may need to reduce your sodium intake to 1,500 mg a day.  Limit alcohol intake to no more than 1 drink a day for nonpregnant women and 2 drinks a day for men. One drink equals 12 oz of beer, 5 oz of wine, or 1 oz of hard liquor.  Work with your health care provider to maintain a healthy body weight or to lose weight. Ask what an ideal weight is for you.  Get at least 30 minutes of exercise that causes your heart to beat faster (aerobic exercise) most days of the week. Activities may include walking, swimming, or biking.  Work with your health care provider or diet and nutrition specialist (dietitian) to adjust your eating plan to your individual calorie needs. Reading food labels  Check food labels for the amount of sodium per serving. Choose foods with less than 5 percent of the Daily Value of sodium. Generally, foods with less than 300 mg of sodium per serving fit into this eating plan.  To find whole grains, look for the word "whole" as the first word in the ingredient list. Shopping  Buy products labeled as "low-sodium" or "no salt added."  Buy fresh foods. Avoid canned foods and premade or frozen meals. Cooking  Avoid adding salt when cooking. Use salt-free seasonings or herbs instead of table salt or sea salt. Check with your health care provider or pharmacist before using salt substitutes.  Do not fry foods. Cook foods using healthy methods such as baking, boiling, grilling, and broiling instead.  Cook with  heart-healthy oils, such as olive, canola, soybean, or sunflower oil. Meal planning   Eat a balanced diet that includes: ? 5 or more servings of fruits and vegetables each day. At each meal, try to fill half of your plate with fruits and vegetables. ? Up to 6-8 servings of whole grains each day. ? Less than 6 oz of lean meat, poultry, or fish each day. A 3-oz serving of meat is about the same size as a deck of cards. One egg equals 1 oz. ? 2 servings of low-fat dairy each day. ? A serving of nuts, seeds, or beans 5 times each week. ? Heart-healthy fats. Healthy fats called Omega-3 fatty acids are found in foods such as flaxseeds and coldwater fish, like sardines, salmon, and mackerel.  Limit how much you eat of the following: ? Canned or prepackaged foods. ? Food that is high in trans fat, such as fried foods. ? Food that is high in saturated fat, such as fatty meat. ? Sweets, desserts, sugary drinks, and other foods with added sugar. ? Full-fat dairy products.  Do not salt foods before eating.  Try to eat at least 2 vegetarian meals each week.  Eat more home-cooked food and less restaurant, buffet, and fast food.  When eating at a restaurant, ask that your food be prepared with less salt or no salt, if possible. What foods are recommended? The items listed may not be a complete list. Talk with your dietitian about what   dietary choices are best for you. Grains Whole-grain or whole-wheat bread. Whole-grain or whole-wheat pasta. Brown rice. Oatmeal. Quinoa. Bulgur. Whole-grain and low-sodium cereals. Pita bread. Low-fat, low-sodium crackers. Whole-wheat flour tortillas. Vegetables Fresh or frozen vegetables (raw, steamed, roasted, or grilled). Low-sodium or reduced-sodium tomato and vegetable juice. Low-sodium or reduced-sodium tomato sauce and tomato paste. Low-sodium or reduced-sodium canned vegetables. Fruits All fresh, dried, or frozen fruit. Canned fruit in natural juice (without  added sugar). Meat and other protein foods Skinless chicken or turkey. Ground chicken or turkey. Pork with fat trimmed off. Fish and seafood. Egg whites. Dried beans, peas, or lentils. Unsalted nuts, nut butters, and seeds. Unsalted canned beans. Lean cuts of beef with fat trimmed off. Low-sodium, lean deli meat. Dairy Low-fat (1%) or fat-free (skim) milk. Fat-free, low-fat, or reduced-fat cheeses. Nonfat, low-sodium ricotta or cottage cheese. Low-fat or nonfat yogurt. Low-fat, low-sodium cheese. Fats and oils Soft margarine without trans fats. Vegetable oil. Low-fat, reduced-fat, or light mayonnaise and salad dressings (reduced-sodium). Canola, safflower, olive, soybean, and sunflower oils. Avocado. Seasoning and other foods Herbs. Spices. Seasoning mixes without salt. Unsalted popcorn and pretzels. Fat-free sweets. What foods are not recommended? The items listed may not be a complete list. Talk with your dietitian about what dietary choices are best for you. Grains Baked goods made with fat, such as croissants, muffins, or some breads. Dry pasta or rice meal packs. Vegetables Creamed or fried vegetables. Vegetables in a cheese sauce. Regular canned vegetables (not low-sodium or reduced-sodium). Regular canned tomato sauce and paste (not low-sodium or reduced-sodium). Regular tomato and vegetable juice (not low-sodium or reduced-sodium). Pickles. Olives. Fruits Canned fruit in a light or heavy syrup. Fried fruit. Fruit in cream or butter sauce. Meat and other protein foods Fatty cuts of meat. Ribs. Fried meat. Bacon. Sausage. Bologna and other processed lunch meats. Salami. Fatback. Hotdogs. Bratwurst. Salted nuts and seeds. Canned beans with added salt. Canned or smoked fish. Whole eggs or egg yolks. Chicken or turkey with skin. Dairy Whole or 2% milk, cream, and half-and-half. Whole or full-fat cream cheese. Whole-fat or sweetened yogurt. Full-fat cheese. Nondairy creamers. Whipped toppings.  Processed cheese and cheese spreads. Fats and oils Butter. Stick margarine. Lard. Shortening. Ghee. Bacon fat. Tropical oils, such as coconut, palm kernel, or palm oil. Seasoning and other foods Salted popcorn and pretzels. Onion salt, garlic salt, seasoned salt, table salt, and sea salt. Worcestershire sauce. Tartar sauce. Barbecue sauce. Teriyaki sauce. Soy sauce, including reduced-sodium. Steak sauce. Canned and packaged gravies. Fish sauce. Oyster sauce. Cocktail sauce. Horseradish that you find on the shelf. Ketchup. Mustard. Meat flavorings and tenderizers. Bouillon cubes. Hot sauce and Tabasco sauce. Premade or packaged marinades. Premade or packaged taco seasonings. Relishes. Regular salad dressings. Where to find more information:  National Heart, Lung, and Blood Institute: www.nhlbi.nih.gov  American Heart Association: www.heart.org Summary  The DASH eating plan is a healthy eating plan that has been shown to reduce high blood pressure (hypertension). It may also reduce your risk for type 2 diabetes, heart disease, and stroke.  With the DASH eating plan, you should limit salt (sodium) intake to 2,300 mg a day. If you have hypertension, you may need to reduce your sodium intake to 1,500 mg a day.  When on the DASH eating plan, aim to eat more fresh fruits and vegetables, whole grains, lean proteins, low-fat dairy, and heart-healthy fats.  Work with your health care provider or diet and nutrition specialist (dietitian) to adjust your eating plan to your individual   calorie needs. This information is not intended to replace advice given to you by your health care provider. Make sure you discuss any questions you have with your health care provider. Document Released: 02/05/2011 Document Revised: 02/10/2016 Document Reviewed: 02/10/2016 Elsevier Interactive Patient Education  2018 Elsevier Inc.  

## 2017-04-20 NOTE — Progress Notes (Signed)
Patient: Jade Gardner Female    DOB: Jan 17, 1946   72 y.o.   MRN: [id number] Visit Date: 04/20/2017  Today's Provider: Lavon Paganini, MD   I, Martha Clan, CMA, am acting as scribe for Lavon Paganini, MD.  Chief Complaint  Patient presents with  . Hypertension   Subjective:    HPI      Hypertension, follow-up:  BP Readings from Last 3 Encounters:  04/20/17 (!) 154/86  04/19/17 (!) 168/98  03/04/17 128/68    She was last seen for hypertension 7 months ago.  BP at that visit was 144/74. Management since that visit includes continuing amlodipine and metoprolol. She reports good compliance with treatment. She states Dr. Rockey Situ D/C the amlodipine due to edema. She is exercising. Walks 1.5 miles daily. She is adherent to low salt diet.   Outside blood pressures were high yesterday, and ranged from 142-197/71-95. Normally the highest BP she has noticed was in the 150's/80's. She is experiencing dyspnea, fatigue and headache.  Patient denies chest pain, chest pressure/discomfort, claudication, exertional chest pressure/discomfort, irregular heart beat, lower extremity edema, near-syncope, orthopnea, palpitations and syncope.   Cardiovascular risk factors include advanced age (older than 53 for men, 17 for women), dyslipidemia and hypertension.  Use of agents associated with hypertension: none.     Weight trend: stable Wt Readings from Last 3 Encounters:  04/20/17 153 lb (69.4 kg)  04/19/17 157 lb (71.2 kg)  03/04/17 158 lb (71.7 kg)    Current diet: in general, a "healthy" diet   Patient states that she researched a low-sodium diet and plans to initiate this soon.  ------------------------------------------------------------------------ Patient also reports sore throat, headaches, nonproductive cough, conjunctivitis x several days.  Her grandsons have recently had flu and strep throat.  She was seen for this at [facility] yesterday and diagnosed with bacterial  conjunctivitis of L eye.  She is using Polytrim, but wants to know how long of a course she needs to take.  Allergies  Allergen Reactions  . Fish Oil Itching    Other reaction(s): Other (See Comments) Burning and stinging  . Simvastatin     Other reaction(s): Muscle Pain Other reaction(s): Other (See Comments) Joint pain     Current Outpatient Medications:  .  Ascorbic Acid (VITAMIN C) 1000 MG tablet, Take 1,000 mg by mouth daily., Disp: , Rfl:  .  aspirin 81 MG tablet, Take 81 mg by mouth daily., Disp: , Rfl:  .  cholecalciferol (VITAMIN D) 1000 units tablet, Take 1 tablet (1,000 Units total) by mouth daily., Disp: , Rfl:  .  clopidogrel (PLAVIX) 75 MG tablet, TAKE (1) TABLET BY MOUTH EVERY DAY WITH BREAKFAST, Disp: 90 tablet, Rfl: 3 .  CRANBERRY CONCENTRATE PO, Take 1 tablet by mouth daily., Disp: , Rfl:  .  cyclobenzaprine (FLEXERIL) 10 MG tablet, Take 0.5-1 tablets (5-10 mg total) by mouth at bedtime as needed for muscle spasms., Disp: 90 tablet, Rfl: 1 .  ezetimibe (ZETIA) 10 MG tablet, Take 1 tablet (10 mg total) by mouth daily., Disp: 90 tablet, Rfl: 3 .  fluticasone (FLONASE) 50 MCG/ACT nasal spray, Place 2 sprays into both nostrils daily. (Patient taking differently: Place 2 sprays into both nostrils daily. ), Disp: 16 g, Rfl: 2 .  ibandronate (BONIVA) 150 MG tablet, TAKE 1 TABLET BY MOUTH MONTHLY- DRINK WITH FULL GLASS OF WATER, ON AN EMPTY STOMACH AND WITH NO OTHER FOOD/DRINK/MEDICATION, Disp: 1 tablet, Rfl: 12 .  ibuprofen (ADVIL,MOTRIN) 200 MG tablet,  Take 200 mg by mouth every 6 (six) hours as needed., Disp: , Rfl:  .  lovastatin (MEVACOR) 20 MG tablet, TAKE 1 TABLET BY MOUTH AT  BEDTIME, Disp: 90 tablet, Rfl: 3 .  metoprolol succinate (TOPROL-XL) 50 MG 24 hr tablet, Take 1 1/2 tablet by mouth daily., Disp: 135 tablet, Rfl: 3 .  montelukast (SINGULAIR) 10 MG tablet, Take 10 mg by mouth at bedtime., Disp: , Rfl:  .  ondansetron (ZOFRAN ODT) 4 MG disintegrating tablet, Take 1  tablet (4 mg total) by mouth every 6 (six) hours as needed for nausea or vomiting., Disp: 20 tablet, Rfl: 0 .  pantoprazole (PROTONIX) 40 MG tablet, Take 1 tablet (40 mg total) by mouth daily., Disp: 90 tablet, Rfl: 3 .  trimethoprim-polymyxin b (POLYTRIM) ophthalmic solution, Place 1 drop into the left eye every 6 (six) hours for 5 days., Disp: 10 mL, Rfl: 0  Review of Systems  Constitutional: Positive for fatigue. Negative for activity change, appetite change, chills, diaphoresis, fever and unexpected weight change.  HENT: Positive for congestion, postnasal drip and sore throat. Negative for ear discharge, ear pain, hearing loss, sinus pressure and sinus pain.   Eyes: Positive for discharge and redness.  Respiratory: Positive for cough and shortness of breath (pt states this lasted a couple of minnutes yesterday). Negative for choking, chest tightness and wheezing.   Cardiovascular: Negative for chest pain, palpitations and leg swelling.  Gastrointestinal: Negative.   Musculoskeletal: Positive for myalgias.  Skin: Negative.   Neurological: Positive for headaches.    Social History   Tobacco Use  . Smoking status: Never Smoker  . Smokeless tobacco: Never Used  Substance Use Topics  . Alcohol use: No   Objective:   BP (!) 154/86 (BP Location: Left Arm, Patient Position: Sitting, Cuff Size: Normal)   Pulse 76   Temp 98.2 F (36.8 C) (Oral)   Resp 16   Wt 153 lb (69.4 kg)   SpO2 96%   BMI 27.54 kg/m  Vitals:   04/20/17 0946  BP: (!) 154/86  Pulse: 76  Resp: 16  Temp: 98.2 F (36.8 C)  TempSrc: Oral  SpO2: 96%  Weight: 153 lb (69.4 kg)     Physical Exam  Constitutional: She is oriented to person, place, and time. She appears well-developed and well-nourished. No distress.  HENT:  Head: Normocephalic and atraumatic.  Right Ear: External ear normal.  Left Ear: External ear normal.  Nose: Mucosal edema present. Right sinus exhibits no maxillary sinus tenderness and no  frontal sinus tenderness. Left sinus exhibits no maxillary sinus tenderness and no frontal sinus tenderness.  Mouth/Throat: Uvula is midline and mucous membranes are normal. Posterior oropharyngeal erythema present. No oropharyngeal exudate or posterior oropharyngeal edema.  Eyes: Conjunctivae are normal. Pupils are equal, round, and reactive to light. Right eye exhibits no discharge. Left eye exhibits discharge. No scleral icterus.  Neck: Neck supple. No thyromegaly present.  Cardiovascular: Normal rate, regular rhythm, normal heart sounds and intact distal pulses.  No murmur heard. Pulmonary/Chest: Effort normal and breath sounds normal. No respiratory distress. She has no wheezes. She has no rales.  Abdominal: Soft. She exhibits no distension. There is no tenderness.  Musculoskeletal: She exhibits no edema.  Lymphadenopathy:    She has no cervical adenopathy.  Neurological: She is alert and oriented to person, place, and time.  Skin: Skin is warm and dry. No rash noted.  Psychiatric: She has a normal mood and affect. Her behavior is normal.  Vitals  reviewed.       Assessment & Plan:     Problem List Items Addressed This Visit      Cardiovascular and Mediastinum   Hypertension, essential, benign - Primary   Relevant Medications   hydrochlorothiazide (HYDRODIURIL) 25 MG tablet    Other Visit Diagnoses    Viral URI       Acute bacterial conjunctivitis of left eye        2. Viral URI -Patient symptoms consistent with viral URI, no evidence of bacterial infection, including pneumonia, otitis, strep pharyngitis -Discussed natural course, symptomatic management, return precautions  3. Acute bacterial conjunctivitis of left eye -Appears to be improving already -Continue Polytrim for a total of 5-7-day course -Return precautions discussed  Return in about 4 weeks (around 05/18/2017) for BP f/u.   The entirety of the information documented in the History of Present Illness, Review  of Systems and Physical Exam were personally obtained by me. Portions of this information were initially documented by Raquel Sarna Ratchford, CMA and reviewed by me for thoroughness and accuracy.    [name], MD, MPH [facility] 04/20/2017 11:23 AM

## 2017-04-20 NOTE — Assessment & Plan Note (Signed)
Uncontrolled Advised to avoid over-the-counter medications unless discussed with her provider first Is likely that her headache and illness and over-the-counter medicines are contributing to this elevation According to her home log, however, it was elevated prior to her current illness Continue metoprolol at current dose Start HCTZ 12.5 mg daily Most recent creatinine within normal limits Follow-up in 1 month and recheck BMP at that time Precautions given including signs of stroke, chest pain, shortness of breath, vision changes

## 2017-05-10 DIAGNOSIS — H2511 Age-related nuclear cataract, right eye: Secondary | ICD-10-CM | POA: Diagnosis not present

## 2017-05-18 ENCOUNTER — Encounter: Payer: Self-pay | Admitting: Family Medicine

## 2017-05-18 ENCOUNTER — Ambulatory Visit (INDEPENDENT_AMBULATORY_CARE_PROVIDER_SITE_OTHER): Payer: Medicare Other | Admitting: Family Medicine

## 2017-05-18 VITALS — BP 126/74 | HR 61 | Temp 97.7°F | Resp 16 | Wt 151.0 lb

## 2017-05-18 DIAGNOSIS — I10 Essential (primary) hypertension: Secondary | ICD-10-CM | POA: Diagnosis not present

## 2017-05-18 DIAGNOSIS — R252 Cramp and spasm: Secondary | ICD-10-CM

## 2017-05-18 DIAGNOSIS — R7989 Other specified abnormal findings of blood chemistry: Secondary | ICD-10-CM | POA: Diagnosis not present

## 2017-05-18 DIAGNOSIS — Z1231 Encounter for screening mammogram for malignant neoplasm of breast: Secondary | ICD-10-CM | POA: Diagnosis not present

## 2017-05-18 DIAGNOSIS — E538 Deficiency of other specified B group vitamins: Secondary | ICD-10-CM | POA: Diagnosis not present

## 2017-05-18 DIAGNOSIS — Z1239 Encounter for other screening for malignant neoplasm of breast: Secondary | ICD-10-CM

## 2017-05-18 NOTE — Assessment & Plan Note (Signed)
TSH was elevated when checked in 08/2016 for annual physical Patient did not know about this She has never been diagnosed with hypothyroidism or treated for this Recheck TSH and free T4 today Treat as indicated by labs

## 2017-05-18 NOTE — Assessment & Plan Note (Signed)
Well-controlled Continue HCTZ at current dose Recheck BMP If need to stop HCTZ due to leg cramps, could consider amlodipine instead

## 2017-05-18 NOTE — Assessment & Plan Note (Signed)
Last to be 12 and 08/2016 was normal, but patient has now been off of her B12 injections for almost 1 year Recheck B12 today Treatment pending lab work

## 2017-05-18 NOTE — Patient Instructions (Signed)
Leg Cramps Leg cramps occur when a muscle or muscles tighten and you have no control over this tightening (involuntary muscle contraction). Muscle cramps can develop in any muscle, but the most common place is in the calf muscles of the leg. Those cramps can occur during exercise or when you are at rest. Leg cramps are painful, and they may last for a few seconds to a few minutes. Cramps may return several times before they finally stop. Usually, leg cramps are not caused by a serious medical problem. In many cases, the cause is not known. Some common causes include:  Overexertion.  Overuse from repetitive motions, or doing the same thing over and over.  Remaining in a certain position for a long period of time.  Improper preparation, form, or technique while performing a sport or an activity.  Dehydration.  Injury.  Side effects of some medicines.  Abnormally low levels of the salts and ions in your blood (electrolytes), especially potassium and calcium. These levels could be low if you are taking water pills (diuretics) or if you are pregnant.  Follow these instructions at home: Watch your condition for any changes. Taking the following actions may help to lessen any discomfort that you are feeling:  Stay well-hydrated. Drink enough fluid to keep your urine clear or pale yellow.  Try massaging, stretching, and relaxing the affected muscle. Do this for several minutes at a time.  For tight or tense muscles, use a warm towel, heating pad, or hot shower water directed to the affected area.  If you are sore or have pain after a cramp, applying ice to the affected area may relieve discomfort. ? Put ice in a plastic bag. ? Place a towel between your skin and the bag. ? Leave the ice on for 20 minutes, 2-3 times per day.  Avoid strenuous exercise for several days if you have been having frequent leg cramps.  Make sure that your diet includes the essential minerals for your muscles to  work normally.  Take medicines only as directed by your health care provider.  Contact a health care provider if:  Your leg cramps get more severe or more frequent, or they do not improve over time.  Your foot becomes cold, numb, or blue. This information is not intended to replace advice given to you by your health care provider. Make sure you discuss any questions you have with your health care provider. Document Released: 03/26/2004 Document Revised: 07/25/2015 Document Reviewed: 01/24/2014 Elsevier Interactive Patient Education  [employer].

## 2017-05-18 NOTE — Assessment & Plan Note (Signed)
Likely multifactorial related to dehydration, medications, possible thyroid dysfunction Check BMP, magnesium Check and treat for thyroid issues as above We will not make any changes to medications at this time, but could consider switching HCTZ to amlodipine in the future Advised on hydration and stretching No evidence of DVT or statin induced myalgias or claudication

## 2017-05-18 NOTE — Progress Notes (Signed)
Patient: Jade Gardner Female    DOB: [birth year]   72 y.o.   MRN: [id number] Visit Date: 05/18/2017  Today's Provider: Lavon Paganini, MD   I, Martha Clan, CMA, am acting as scribe for Lavon Paganini, MD.  Chief Complaint  Patient presents with  . Hypertension   Subjective:    HPI      Hypertension, follow-up:  BP Readings from Last 3 Encounters:  05/18/17 126/74  04/20/17 (!) 154/86  04/19/17 (!) 168/98    She was last seen for hypertension 4 weeks ago.  BP at that visit was 154/86. Management since that visit includes adding HCTZ 12.5 mg po qd. She reports excellent compliance with treatment. She is not having side effects.  She is exercising. Is walking about 1.5 miles per day. She is adherent to low salt diet.   Outside blood pressures are normal per pt. She is experiencing fatigue. Pt states she was taking B12 injections, but her insurance stopped paying for this. Her friend, who has the same insurance, is getting these injections now. She would like to have labs done to see if she needs to restart these injections. Patient denies chest pain, chest pressure/discomfort, claudication, dyspnea, exertional chest pressure/discomfort, irregular heart beat, lower extremity edema, near-syncope, orthopnea, palpitations and syncope.   Cardiovascular risk factors include advanced age (older than 69 for men, 9 for women), dyslipidemia and hypertension.  Use of agents associated with hypertension: none.     Weight trend: decreasing steadily Wt Readings from Last 3 Encounters:  05/18/17 151 lb (68.5 kg)  04/20/17 153 lb (69.4 kg)  04/19/17 157 lb (71.2 kg)    Current diet: in general, a "healthy" diet    ------------------------------------------------------------------------ Pt is c/o leg and hand cramps. She has read that this is a side effect of pantoprazole.  This has been going on intermittently for about a year.  She notices the leg cramps when she  lays down in bed and tries to stretch her legs.  Her fingers will contract up occasionally.  This occurs mostly at rest.  She denies any claudication.  Cramps have not changed since starting HCTZ.  She states is different than the myalgias that she had on simvastatin.  She knows that she does not stay well-hydrated and does not drink much water.  Off of B12 shots for ~1 yr due to lack of insurance coverage.  She wonders if her B12 level is normal or if she needs to resume her injections.  She was never diagnosed with pernicious anemia, only B12 deficiency.  Allergies  Allergen Reactions  . Fish Oil Itching    Other reaction(s): Other (See Comments) Burning and stinging  . Simvastatin     Other reaction(s): Muscle Pain Other reaction(s): Other (See Comments) Joint pain     Current Outpatient Medications:  .  Ascorbic Acid (VITAMIN C) 1000 MG tablet, Take 1,000 mg by mouth daily., Disp: , Rfl:  .  aspirin 81 MG tablet, Take 81 mg by mouth daily., Disp: , Rfl:  .  cholecalciferol (VITAMIN D) 1000 units tablet, Take 1 tablet (1,000 Units total) by mouth daily., Disp: , Rfl:  .  clopidogrel (PLAVIX) 75 MG tablet, TAKE (1) TABLET BY MOUTH EVERY DAY WITH BREAKFAST, Disp: 90 tablet, Rfl: 3 .  CRANBERRY CONCENTRATE PO, Take 1 tablet by mouth daily., Disp: , Rfl:  .  cyclobenzaprine (FLEXERIL) 10 MG tablet, Take 0.5-1 tablets (5-10 mg total) by mouth at bedtime as needed for muscle  spasms., Disp: 90 tablet, Rfl: 1 .  ezetimibe (ZETIA) 10 MG tablet, Take 1 tablet (10 mg total) by mouth daily., Disp: 90 tablet, Rfl: 3 .  fluticasone (FLONASE) 50 MCG/ACT nasal spray, Place 2 sprays into both nostrils daily. (Patient taking differently: Place 2 sprays into both nostrils daily. ), Disp: 16 g, Rfl: 2 .  hydrochlorothiazide (HYDRODIURIL) 25 MG tablet, Take 0.5 tablets (12.5 mg total) by mouth daily., Disp: 15 tablet, Rfl: 2 .  ibandronate (BONIVA) 150 MG tablet, TAKE 1 TABLET BY MOUTH MONTHLY- DRINK WITH  FULL GLASS OF WATER, ON AN EMPTY STOMACH AND WITH NO OTHER FOOD/DRINK/MEDICATION, Disp: 1 tablet, Rfl: 12 .  ibuprofen (ADVIL,MOTRIN) 200 MG tablet, Take 200 mg by mouth every 6 (six) hours as needed., Disp: , Rfl:  .  lovastatin (MEVACOR) 20 MG tablet, TAKE 1 TABLET BY MOUTH AT  BEDTIME, Disp: 90 tablet, Rfl: 3 .  metoprolol succinate (TOPROL-XL) 50 MG 24 hr tablet, Take 1 1/2 tablet by mouth daily., Disp: 135 tablet, Rfl: 3 .  montelukast (SINGULAIR) 10 MG tablet, Take 10 mg by mouth at bedtime., Disp: , Rfl:  .  ondansetron (ZOFRAN ODT) 4 MG disintegrating tablet, Take 1 tablet (4 mg total) by mouth every 6 (six) hours as needed for nausea or vomiting., Disp: 20 tablet, Rfl: 0 .  pantoprazole (PROTONIX) 40 MG tablet, Take 1 tablet (40 mg total) by mouth daily., Disp: 90 tablet, Rfl: 3  Review of Systems  Constitutional: Positive for fatigue. Negative for activity change, appetite change, chills, diaphoresis, fever and unexpected weight change.  HENT: Negative.   Respiratory: Negative.  Negative for shortness of breath.   Cardiovascular: Negative for chest pain, palpitations and leg swelling.  Gastrointestinal: Negative.   Musculoskeletal: Positive for myalgias. Negative for arthralgias.  Neurological: Negative.   Psychiatric/Behavioral: Negative.     Social History   Tobacco Use  . Smoking status: Never Smoker  . Smokeless tobacco: Never Used  Substance Use Topics  . Alcohol use: No   Objective:   BP 126/74 (BP Location: Left Arm, Patient Position: Sitting, Cuff Size: Normal)   Pulse 61   Temp 97.7 F (36.5 C) (Oral)   Resp 16   Wt 151 lb (68.5 kg)   SpO2 97%   BMI 27.18 kg/m  Vitals:   05/18/17 0828  BP: 126/74  Pulse: 61  Resp: 16  Temp: 97.7 F (36.5 C)  TempSrc: Oral  SpO2: 97%  Weight: 151 lb (68.5 kg)     Physical Exam  Constitutional: She is oriented to person, place, and time. She appears well-developed and well-nourished. No distress.  HENT:  Head:  Normocephalic and atraumatic.  Eyes: Conjunctivae are normal. No scleral icterus.  Neck: Neck supple. No thyromegaly present.  Cardiovascular: Normal rate, regular rhythm, normal heart sounds and intact distal pulses.  No murmur heard. Pulmonary/Chest: Effort normal and breath sounds normal. No respiratory distress. She has no wheezes. She has no rales.  Musculoskeletal: She exhibits no edema.  Neurological: She is alert and oriented to person, place, and time.  Skin: Skin is warm and dry. No rash noted.  Psychiatric: She has a normal mood and affect. Her behavior is normal.  Vitals reviewed.      Assessment & Plan:     Problem List Items Addressed This Visit      Cardiovascular and Mediastinum   Benign essential HTN - Primary    Well-controlled Continue HCTZ at current dose Recheck BMP If need to stop HCTZ  due to leg cramps, could consider amlodipine instead      Relevant Orders   Basic Metabolic Panel (BMET)     Other   B12 deficiency    Last to be 12 and 08/2016 was normal, but patient has now been off of her B12 injections for almost 1 year Recheck B12 today Treatment pending lab work      Relevant Orders   B12   Elevated TSH    TSH was elevated when checked in 08/2016 for annual physical Patient did not know about this She has never been diagnosed with hypothyroidism or treated for this Recheck TSH and free T4 today Treat as indicated by labs      Relevant Orders   TSH   T4, free   Leg cramps    Likely multifactorial related to dehydration, medications, possible thyroid dysfunction Check BMP, magnesium Check and treat for thyroid issues as above We will not make any changes to medications at this time, but could consider switching HCTZ to amlodipine in the future Advised on hydration and stretching No evidence of DVT or statin induced myalgias or claudication      Relevant Orders   Magnesium    Other Visit Diagnoses    Screening for breast cancer         Relevant Orders   MM SCREENING BREAST TOMO BILATERAL      Return in about 2 months (around 07/18/2017) for BP, thyroid f/u, concerns.   The entirety of the information documented in the History of Present Illness, Review of Systems and Physical Exam were personally obtained by me. Portions of this information were initially documented by Raquel Sarna Ratchford, CMA and reviewed by me for thoroughness and accuracy.    [name], MD, MPH [facility] 05/18/2017 9:23 AM

## 2017-05-19 ENCOUNTER — Other Ambulatory Visit: Payer: Self-pay

## 2017-05-19 ENCOUNTER — Other Ambulatory Visit: Payer: Self-pay | Admitting: Cardiovascular Disease

## 2017-05-19 ENCOUNTER — Telehealth: Payer: Self-pay

## 2017-05-19 LAB — T4, FREE: FREE T4: 1.22 ng/dL (ref 0.82–1.77)

## 2017-05-19 LAB — BASIC METABOLIC PANEL
BUN/Creatinine Ratio: 16 (ref 12–28)
BUN: 15 mg/dL (ref 8–27)
CALCIUM: 10.7 mg/dL — AB (ref 8.7–10.3)
CO2: 26 mmol/L (ref 20–29)
Chloride: 100 mmol/L (ref 96–106)
Creatinine, Ser: 0.91 mg/dL (ref 0.57–1.00)
GFR calc Af Amer: 72 mL/min/{1.73_m2} (ref >59)
GFR calc non Af Amer: 63 mL/min/{1.73_m2} (ref >59)
GLUCOSE: 97 mg/dL (ref 65–99)
Potassium: 4.3 mmol/L (ref 3.5–5.2)
Sodium: 140 mmol/L (ref 134–144)

## 2017-05-19 LAB — TSH: TSH: 2.43 u[IU]/mL (ref 0.450–4.500)

## 2017-05-19 LAB — VITAMIN B12: Vitamin B-12: 235 pg/mL (ref 232–1245)

## 2017-05-19 LAB — MAGNESIUM: MAGNESIUM: 2.1 mg/dL (ref 1.6–2.3)

## 2017-05-19 NOTE — Telephone Encounter (Signed)
Patient advised as directed below. Scheduled patient for B12 this Friday.

## 2017-05-19 NOTE — Telephone Encounter (Signed)
-----   Message from [name], MD sent at 05/19/2017  8:52 AM EDT ----- Normal Thyroid function, kidney function, electrolytes. B12 level is low normal.  Would recommend restarting B12 injections as this is trending back down.  Calcium is high. Recommend discontinuing any calcium supplements. We will recheck at next visit.  [name], MD, MPH [facility] 05/19/2017 8:52 AM

## 2017-05-20 NOTE — Discharge Instructions (Signed)

## 2017-05-21 ENCOUNTER — Ambulatory Visit (INDEPENDENT_AMBULATORY_CARE_PROVIDER_SITE_OTHER): Payer: Medicare Other

## 2017-05-21 ENCOUNTER — Other Ambulatory Visit: Payer: Self-pay

## 2017-05-21 DIAGNOSIS — E538 Deficiency of other specified B group vitamins: Secondary | ICD-10-CM | POA: Diagnosis not present

## 2017-05-21 MED ORDER — CYANOCOBALAMIN 1000 MCG/ML IJ SOLN
1000.0000 ug | Freq: Once | INTRAMUSCULAR | Status: AC
  Administered 2017-05-21: 1000 ug via INTRAMUSCULAR

## 2017-05-21 MED ORDER — EZETIMIBE 10 MG PO TABS: 10.0000 mg | ORAL_TABLET | Freq: Every day | ORAL | 3 refills | Status: DC

## 2017-05-21 NOTE — Telephone Encounter (Signed)
*  STAT* If patient is at the pharmacy, call can be transferred to refill team.   1. Which medications need to be refilled? (please list name of each medication and dose if known) ZETIA  2. Which pharmacy/location (including street and city if local pharmacy) is medication to be sent to?[facility]  3. Do they need a 30 day or 90 day supply? [place]

## 2017-05-26 ENCOUNTER — Encounter
Admission: RE | Disposition: A | Discharge status: Home / Self Care | Payer: Self-pay | Source: Ambulatory Visit | Attending: Ophthalmology

## 2017-05-26 ENCOUNTER — Ambulatory Visit: Payer: Medicare Other | Admitting: Anesthesiology

## 2017-05-26 ENCOUNTER — Ambulatory Visit
Admission: RE | Admit: 2017-05-26 | Discharge: 2017-05-26 | Disposition: A | Discharge status: Home / Self Care | Payer: Medicare Other | Source: Ambulatory Visit | Attending: Ophthalmology | Admitting: Ophthalmology

## 2017-05-26 DIAGNOSIS — H2511 Age-related nuclear cataract, right eye: Secondary | ICD-10-CM | POA: Diagnosis not present

## 2017-05-26 DIAGNOSIS — I10 Essential (primary) hypertension: Secondary | ICD-10-CM | POA: Diagnosis not present

## 2017-05-26 DIAGNOSIS — Z8673 Personal history of transient ischemic attack (TIA), and cerebral infarction without residual deficits: Secondary | ICD-10-CM | POA: Insufficient documentation

## 2017-05-26 DIAGNOSIS — H25811 Combined forms of age-related cataract, right eye: Secondary | ICD-10-CM | POA: Diagnosis not present

## 2017-05-26 HISTORY — DX: Pure hypercholesterolemia, unspecified: E78.00

## 2017-05-26 HISTORY — PX: CATARACT EXTRACTION W/PHACO: SHX586

## 2017-05-26 SURGERY — PHACOEMULSIFICATION, CATARACT, WITH IOL INSERTION
Anesthesia: Monitor Anesthesia Care | Site: Eye | Laterality: Right | Wound class: "Clean "

## 2017-05-26 MED ORDER — ACETAMINOPHEN 160 MG/5ML PO SOLN: 325.0000 mg | ORAL | Status: DC | PRN

## 2017-05-26 MED ORDER — FENTANYL CITRATE (PF) 100 MCG/2ML IJ SOLN
INTRAMUSCULAR | Status: DC | PRN
  Administered 2017-05-26: 50 ug via INTRAVENOUS

## 2017-05-26 MED ORDER — LIDOCAINE HCL (PF) 2 % IJ SOLN
INTRAOCULAR | Status: DC | PRN
  Administered 2017-05-26: 1 mL

## 2017-05-26 MED ORDER — ACETAMINOPHEN 325 MG PO TABS: 650.0000 mg | ORAL_TABLET | Freq: Once | ORAL | Status: DC | PRN

## 2017-05-26 MED ORDER — MIDAZOLAM HCL 2 MG/2ML IJ SOLN
INTRAMUSCULAR | Status: DC | PRN
  Administered 2017-05-26: 2 mg via INTRAVENOUS

## 2017-05-26 MED ORDER — ONDANSETRON HCL 4 MG/2ML IJ SOLN: 4.0000 mg | Freq: Once | INTRAMUSCULAR | Status: DC | PRN

## 2017-05-26 MED ORDER — ARMC OPHTHALMIC DILATING DROPS
1.0000 "application " | OPHTHALMIC | Status: DC | PRN
  Administered 2017-05-26 (×3): 1 via OPHTHALMIC

## 2017-05-26 MED ORDER — EPINEPHRINE PF 1 MG/ML IJ SOLN
INTRAOCULAR | Status: DC | PRN
  Administered 2017-05-26: 67 mL via OPHTHALMIC

## 2017-05-26 MED ORDER — BRIMONIDINE TARTRATE-TIMOLOL 0.2-0.5 % OP SOLN
OPHTHALMIC | Status: DC | PRN
  Administered 2017-05-26: 1 [drp] via OPHTHALMIC

## 2017-05-26 MED ORDER — NA HYALUR & NA CHOND-NA HYALUR 0.4-0.35 ML IO KIT
PACK | INTRAOCULAR | Status: DC | PRN
  Administered 2017-05-26: 1 mL via INTRAOCULAR

## 2017-05-26 MED ORDER — LACTATED RINGERS IV SOLN: INTRAVENOUS | Status: DC

## 2017-05-26 MED ORDER — CEFUROXIME OPHTHALMIC INJECTION 1 MG/0.1 ML
INJECTION | OPHTHALMIC | Status: DC | PRN
  Administered 2017-05-26: 0.1 mL via INTRACAMERAL

## 2017-05-26 MED ORDER — MOXIFLOXACIN HCL 0.5 % OP SOLN
1.0000 [drp] | OPHTHALMIC | Status: DC | PRN
  Administered 2017-05-26 (×3): 1 [drp] via OPHTHALMIC

## 2017-05-26 SURGICAL SUPPLY — 27 items
CANNULA ANT/CHMB 27G (MISCELLANEOUS) ×1 IMPLANT
CANNULA ANT/CHMB 27GA (MISCELLANEOUS) ×3 IMPLANT
CARTRIDGE ABBOTT (MISCELLANEOUS) IMPLANT
GLOVE SURG LX 7.5 STRW (GLOVE) ×2
GLOVE SURG LX STRL 7.5 STRW (GLOVE) ×1 IMPLANT
GLOVE SURG TRIUMPH 8.0 PF LTX (GLOVE) ×3 IMPLANT
GOWN STRL REUS W/ TWL LRG LVL3 (GOWN DISPOSABLE) ×2 IMPLANT
GOWN STRL REUS W/TWL LRG LVL3 (GOWN DISPOSABLE) ×4
LENS IOL TECNIS ITEC 22.0 (Intraocular Lens) ×2 IMPLANT
MARKER SKIN DUAL TIP RULER LAB (MISCELLANEOUS) ×3 IMPLANT
NDL FILTER BLUNT 18X1 1/2 (NEEDLE) ×1 IMPLANT
NDL RETROBULBAR .5 NSTRL (NEEDLE) IMPLANT
NEEDLE FILTER BLUNT 18X 1/2SAF (NEEDLE) ×2
NEEDLE FILTER BLUNT 18X1 1/2 (NEEDLE) ×1 IMPLANT
PACK CATARACT BRASINGTON (MISCELLANEOUS) ×3 IMPLANT
PACK EYE AFTER SURG (MISCELLANEOUS) ×3 IMPLANT
PACK OPTHALMIC (MISCELLANEOUS) ×3 IMPLANT
RING MALYGIN 7.0 (MISCELLANEOUS) IMPLANT
SUT ETHILON 10-0 CS-B-6CS-B-6 (SUTURE)
SUT VICRYL  9 0 (SUTURE)
SUT VICRYL 9 0 (SUTURE) IMPLANT
SUTURE EHLN 10-0 CS-B-6CS-B-6 (SUTURE) IMPLANT
SYR 3ML LL SCALE MARK (SYRINGE) ×3 IMPLANT
SYR 5ML LL (SYRINGE) ×3 IMPLANT
SYR TB 1ML LUER SLIP (SYRINGE) ×3 IMPLANT
WATER STERILE IRR 500ML POUR (IV SOLUTION) ×3 IMPLANT
WIPE NON LINTING 3.25X3.25 (MISCELLANEOUS) ×3 IMPLANT

## 2017-05-26 NOTE — Anesthesia Procedure Notes (Signed)
Procedure Name: MAC Performed by: Laurabeth Yip, CRNA Pre-anesthesia Checklist: Patient identified, Emergency Drugs available, Suction available, Timeout performed and Patient being monitored Patient Re-evaluated:Patient Re-evaluated prior to induction Oxygen Delivery Method: Nasal cannula Placement Confirmation: positive ETCO2       

## 2017-05-26 NOTE — Anesthesia Preprocedure Evaluation (Signed)
Anesthesia Evaluation  Patient identified by MRN, date of birth, ID band Patient awake    Reviewed: Allergy & Precautions, NPO status , Patient's Chart, lab work & pertinent test results  History of Anesthesia Complications Negative for: history of anesthetic complications  Airway Mallampati: II  TM Distance: >3 FB Neck ROM: Full    Dental  (+)    Pulmonary  Snoring    Pulmonary exam normal breath sounds clear to auscultation       Cardiovascular Exercise Tolerance: Good hypertension, Normal cardiovascular exam+ dysrhythmias Supra Ventricular Tachycardia  Rhythm:Regular Rate:Normal     Neuro/Psych TIA (2016)   GI/Hepatic GERD  ,Diverticulosis    Endo/Other  negative endocrine ROS  Renal/GU negative Renal ROS     Musculoskeletal   Abdominal   Peds  Hematology  (+) Blood dyscrasia, anemia ,   Anesthesia Other Findings   Reproductive/Obstetrics                             Anesthesia Physical Anesthesia Plan  ASA: II  Anesthesia Plan: MAC   Post-op Pain Management:    Induction: Intravenous  PONV Risk Score and Plan: 2 and TIVA and Midazolam  Airway Management Planned: Natural Airway  Additional Equipment:   Intra-op Plan:   Post-operative Plan:   Informed Consent: I have reviewed the patients History and Physical, chart, labs and discussed the procedure including the risks, benefits and alternatives for the proposed anesthesia with the patient or authorized representative who has indicated his/her understanding and acceptance.     Plan Discussed with: CRNA  Anesthesia Plan Comments:         Anesthesia Quick Evaluation

## 2017-05-26 NOTE — Transfer of Care (Signed)
Immediate Anesthesia Transfer of Care Note  Patient: Jade Gardner  Procedure(s) Performed: CATARACT EXTRACTION PHACO AND INTRAOCULAR LENS PLACEMENT (IOC) RIGHT (Right Eye)  Patient Location: PACU  Anesthesia Type: MAC  Level of Consciousness: awake, alert  and patient cooperative  Airway and Oxygen Therapy: Patient Spontanous Breathing and Patient connected to supplemental oxygen  Post-op Assessment: Post-op Vital signs reviewed, Patient's Cardiovascular Status Stable, Respiratory Function Stable, Patent Airway and No signs of Nausea or vomiting  Post-op Vital Signs: Reviewed and stable  Complications: No apparent anesthesia complications

## 2017-05-26 NOTE — Anesthesia Postprocedure Evaluation (Signed)
Anesthesia Post Note  Patient: Jade Gardner  Procedure(s) Performed: CATARACT EXTRACTION PHACO AND INTRAOCULAR LENS PLACEMENT (IOC) RIGHT (Right Eye)  Patient location during evaluation: PACU Anesthesia Type: MAC Level of consciousness: awake and alert, oriented and patient cooperative Pain management: pain level controlled Vital Signs Assessment: post-procedure vital signs reviewed and stable Respiratory status: spontaneous breathing, nonlabored ventilation and respiratory function stable Cardiovascular status: blood pressure returned to baseline and stable Postop Assessment: adequate PO intake Anesthetic complications: no    Darrin Nipper

## 2017-05-26 NOTE — H&P (Signed)
The History and Physical notes are on paper, have been signed, and are to be scanned. The patient remains stable and unchanged from the H&P.   Previous H&P reviewed, patient examined, and there are no changes.  Jade Gardner 05/26/2017 9:54 AM

## 2017-05-26 NOTE — Op Note (Signed)
LOCATION:  [place]   PREOPERATIVE DIAGNOSIS:    Nuclear sclerotic cataract right eye. H25.11   POSTOPERATIVE DIAGNOSIS:  Nuclear sclerotic cataract right eye.     PROCEDURE:  Phacoemusification with posterior chamber intraocular lens placement of the right eye   LENS:   Implant Name Type Inv. Item Serial No. Manufacturer Lot No. LRB No. Used  LENS IOL DIOP 22.0 - [id number] Intraocular Lens LENS IOL DIOP 22.0 [id number]  Right 1        ULTRASOUND TIME: 14 % of 1 minutes, 20 seconds.  CDE 11.8   SURGEON:  Wyonia Hough, MD   ANESTHESIA:  Topical with tetracaine drops and 2% Xylocaine jelly, augmented with 1% preservative-free intracameral lidocaine.    COMPLICATIONS:  None.   DESCRIPTION OF PROCEDURE:  The patient was identified in the holding room and transported to the operating room and placed in the supine position under the operating microscope.  The right eye was identified as the operative eye and it was prepped and draped in the usual sterile ophthalmic fashion.   A 1 millimeter clear-corneal paracentesis was made at the 12:00 position.  0.5 ml of preservative-free 1% lidocaine was injected into the anterior chamber. The anterior chamber was filled with Viscoat viscoelastic.  A 2.4 millimeter keratome was used to make a near-clear corneal incision at the 9:00 position.  A curvilinear capsulorrhexis was made with a cystotome and capsulorrhexis forceps.  Balanced salt solution was used to hydrodissect and hydrodelineate the nucleus.   Phacoemulsification was then used in stop and chop fashion to remove the lens nucleus and epinucleus.  The remaining cortex was then removed using the irrigation and aspiration handpiece. Provisc was then placed into the capsular bag to distend it for lens placement.  A lens was then injected into the capsular bag.  The remaining viscoelastic was aspirated.   Wounds were hydrated with balanced salt solution.  The anterior  chamber was inflated to a physiologic pressure with balanced salt solution.  No wound leaks were noted. Cefuroxime 0.1 ml of a 8m/ml solution was injected into the anterior chamber for a dose of 1 mg of intracameral antibiotic at the completion of the case.   Timolol and Brimonidine drops were applied to the eye.  The patient was taken to the recovery room in stable condition without complications of anesthesia or surgery.   Jessiah Steinhart 05/26/2017, 10:46 AM

## 2017-05-27 ENCOUNTER — Encounter: Payer: Self-pay | Admitting: Ophthalmology

## 2017-05-28 ENCOUNTER — Encounter: Payer: Self-pay | Admitting: Family Medicine

## 2017-05-28 ENCOUNTER — Ambulatory Visit (INDEPENDENT_AMBULATORY_CARE_PROVIDER_SITE_OTHER): Payer: Medicare Other | Admitting: Family Medicine

## 2017-05-28 VITALS — BP 130/64 | HR 67 | Temp 98.0°F | Resp 16 | Wt 150.0 lb

## 2017-05-28 DIAGNOSIS — N3091 Cystitis, unspecified with hematuria: Secondary | ICD-10-CM | POA: Diagnosis not present

## 2017-05-28 LAB — POCT URINALYSIS DIPSTICK
Bilirubin, UA: NEGATIVE
Glucose, UA: NEGATIVE
KETONES UA: NEGATIVE
NITRITE UA: POSITIVE
PH UA: 6 (ref 5.0–8.0)
PROTEIN UA: NEGATIVE
SPEC GRAV UA: 1.01 (ref 1.010–1.025)
UROBILINOGEN UA: 0.2 U/dL

## 2017-05-28 MED ORDER — CIPROFLOXACIN HCL 500 MG PO TABS: 500.0000 mg | ORAL_TABLET | Freq: Two times a day (BID) | ORAL | 0 refills | Status: AC

## 2017-05-28 NOTE — Patient Instructions (Signed)

## 2017-05-28 NOTE — Progress Notes (Signed)
Patient: Jade Gardner Female    DOB: Sep 10, 1945   72 y.o.   MRN: [id number] Visit Date: 05/28/2017  Today's Provider: Lavon Paganini, MD   I, Martha Clan, CMA, am acting as scribe for Lavon Paganini, MD.  Chief Complaint  Patient presents with  . Urinary Tract Infection   Subjective:    Urinary Tract Infection   This is a new problem. The current episode started today. The problem has been gradually worsening. The quality of the pain is described as burning. The pain is mild. There has been no fever. There is no history of pyelonephritis. Associated symptoms include flank pain, frequency, hesitancy and nausea. Pertinent negatives include no chills, discharge, hematuria, sweats, urgency or vomiting. Associated symptoms comments: And abdominal pain. Treatments tried: Cranberry. The treatment provided no relief. Her past medical history is significant for recurrent UTIs.       Allergies  Allergen Reactions  . Fish Oil Itching    Other reaction(s): Other (See Comments) Burning and stinging  . Simvastatin     Other reaction(s): Muscle Pain Other reaction(s): Other (See Comments) Joint pain     Current Outpatient Medications:  .  Ascorbic Acid (VITAMIN C) 1000 MG tablet, Take 1,000 mg by mouth daily. am, Disp: , Rfl:  .  aspirin 81 MG tablet, Take 81 mg by mouth daily. pm, Disp: , Rfl:  .  cholecalciferol (VITAMIN D) 1000 units tablet, Take 1 tablet (1,000 Units total) by mouth daily., Disp: , Rfl:  .  clopidogrel (PLAVIX) 75 MG tablet, TAKE (1) TABLET BY MOUTH EVERY DAY WITH BREAKFAST, Disp: 90 tablet, Rfl: 3 .  Cranberry 500 MG CAPS, Take by mouth. am, Disp: , Rfl:  .  cyclobenzaprine (FLEXERIL) 10 MG tablet, Take 0.5-1 tablets (5-10 mg total) by mouth at bedtime as needed for muscle spasms., Disp: 90 tablet, Rfl: 1 .  Diphenhydramine-APAP (PERCOGESIC EXTRA STRENGTH) 12.5-500 MG TABS, Take by mouth daily. pm, Disp: , Rfl:  .  ezetimibe (ZETIA) 10 MG tablet,  Take 1 tablet (10 mg total) by mouth daily., Disp: 90 tablet, Rfl: 3 .  fluticasone (FLONASE) 50 MCG/ACT nasal spray, Place 2 sprays into both nostrils daily. (Patient taking differently: Place 2 sprays into both nostrils as needed. pm), Disp: 16 g, Rfl: 2 .  hydrochlorothiazide (HYDRODIURIL) 25 MG tablet, Take 0.5 tablets (12.5 mg total) by mouth daily. (Patient taking differently: Take 12.5 mg by mouth daily. am), Disp: 15 tablet, Rfl: 2 .  ibandronate (BONIVA) 150 MG tablet, TAKE 1 TABLET BY MOUTH MONTHLY- DRINK WITH FULL GLASS OF WATER, ON AN EMPTY STOMACH AND WITH NO OTHER FOOD/DRINK/MEDICATION, Disp: 1 tablet, Rfl: 12 .  ibuprofen (ADVIL,MOTRIN) 200 MG tablet, Take 200 mg by mouth every 6 (six) hours as needed., Disp: , Rfl:  .  lovastatin (MEVACOR) 20 MG tablet, TAKE 1 TABLET BY MOUTH AT  BEDTIME, Disp: 90 tablet, Rfl: 3 .  metoprolol succinate (TOPROL-XL) 50 MG 24 hr tablet, Take 1 1/2 tablet by mouth daily. (Patient taking differently: Take 1 1/2 tablet by mouth daily./hs), Disp: 135 tablet, Rfl: 3 .  montelukast (SINGULAIR) 10 MG tablet, Take 10 mg by mouth at bedtime., Disp: , Rfl:  .  ondansetron (ZOFRAN ODT) 4 MG disintegrating tablet, Take 1 tablet (4 mg total) by mouth every 6 (six) hours as needed for nausea or vomiting., Disp: 20 tablet, Rfl: 0 .  pantoprazole (PROTONIX) 40 MG tablet, Take 1 tablet (40 mg total) by mouth daily. (  Patient taking differently: Take 40 mg by mouth daily. pm), Disp: 90 tablet, Rfl: 3  Review of Systems  Constitutional: Negative for chills.  Gastrointestinal: Positive for nausea. Negative for vomiting.  Genitourinary: Positive for flank pain, frequency and hesitancy. Negative for hematuria and urgency.    Social History   Tobacco Use  . Smoking status: Never Smoker  . Smokeless tobacco: Never Used  Substance Use Topics  . Alcohol use: No   Objective:   BP 130/64 (BP Location: Left Arm, Patient Position: Sitting, Cuff Size: Large)   Pulse 67    Temp 98 F (36.7 C) (Oral)   Resp 16   Wt 150 lb (68 kg)   SpO2 97%   BMI 27.00 kg/m  Vitals:   05/28/17 1345  BP: 130/64  Pulse: 67  Resp: 16  Temp: 98 F (36.7 C)  TempSrc: Oral  SpO2: 97%  Weight: 150 lb (68 kg)     Physical Exam  Constitutional: She is oriented to person, place, and time. She appears well-developed and well-nourished. No distress.  HENT:  Head: Normocephalic and atraumatic.  Eyes: Conjunctivae are normal. No scleral icterus.  Cardiovascular: Normal rate, regular rhythm, normal heart sounds and intact distal pulses.  No murmur heard. Pulmonary/Chest: Effort normal and breath sounds normal. No respiratory distress. She has no wheezes. She has no rales.  Abdominal: Soft. She exhibits no distension. There is no tenderness. There is CVA tenderness (on L side, mild). There is no rebound.  Musculoskeletal: She exhibits no edema.  Neurological: She is alert and oriented to person, place, and time.  Skin: Skin is warm and dry. No rash noted.  Psychiatric: She has a normal mood and affect. Her behavior is normal.  Vitals reviewed.   Results for orders placed or performed in visit on 05/28/17  POCT urinalysis dipstick  Result Value Ref Range   Color, UA yellow    Clarity, UA cloudy    Glucose, UA Negative    Bilirubin, UA Negative    Ketones, UA Negative    Spec Grav, UA 1.010 1.010 - 1.025   Blood, UA NH Moderate    pH, UA 6.0 5.0 - 8.0   Protein, UA Negative    Urobilinogen, UA 0.2 0.2 or 1.0 E.U./dL   Nitrite, UA Positive    Leukocytes, UA Moderate (2+) (A) Negative   Appearance cloudy    Odor fishy     Reviewed last Urine culture from 07/2016 with Proteus with many resistances.  Sensitive to Cipro.    Assessment & Plan:   1. Cystitis with hematuria UA and symptoms highly suggestive of UTI with hematuria  Does have some CVAT, but no fevers Will monitor closely for other signs of Pyelo Treat with 5d course of Cipro based on sensitivities of  last urine culture Return precautions discussed - POCT urinalysis dipstick - CULTURE, URINE COMPREHENSIVE   Meds ordered this encounter  Medications  . ciprofloxacin (CIPRO) 500 MG tablet    Sig: Take 1 tablet (500 mg total) by mouth 2 (two) times daily for 5 days.    Dispense:  10 tablet    Refill:  0     Return if symptoms worsen or fail to improve.   The entirety of the information documented in the History of Present Illness, Review of Systems and Physical Exam were personally obtained by me. Portions of this information were initially documented by Raquel Sarna Ratchford, CMA and reviewed by me for thoroughness and accuracy.    [place],  Dionne Bucy, MD, MPH [facility] 05/28/2017 2:20 PM

## 2017-05-31 LAB — CULTURE, URINE COMPREHENSIVE

## 2017-06-01 ENCOUNTER — Telehealth: Payer: Self-pay

## 2017-06-01 NOTE — Telephone Encounter (Signed)
Patient advised as directed below.  Thanks,  -Marilee Ditommaso 

## 2017-06-01 NOTE — Telephone Encounter (Signed)
-----   Message from [name], MD sent at 06/01/2017  9:09 AM EDT ----- Urine culture shows UTI that is sensitive to the antibiotic we used and all other antibiotics.  Can finish course as prescribed

## 2017-06-18 ENCOUNTER — Ambulatory Visit: Payer: Self-pay

## 2017-06-25 ENCOUNTER — Ambulatory Visit: Payer: Self-pay

## 2017-06-25 ENCOUNTER — Ambulatory Visit (INDEPENDENT_AMBULATORY_CARE_PROVIDER_SITE_OTHER): Payer: Medicare Other | Admitting: Family Medicine

## 2017-06-25 DIAGNOSIS — E538 Deficiency of other specified B group vitamins: Secondary | ICD-10-CM | POA: Diagnosis not present

## 2017-06-25 MED ORDER — CYANOCOBALAMIN 1000 MCG/ML IJ SOLN
1000.0000 ug | Freq: Once | INTRAMUSCULAR | Status: AC
  Administered 2017-06-25: 1000 ug via INTRAMUSCULAR

## 2017-07-07 ENCOUNTER — Other Ambulatory Visit: Payer: Self-pay | Admitting: Cardiovascular Disease

## 2017-07-07 ENCOUNTER — Other Ambulatory Visit: Payer: Self-pay | Admitting: Family Medicine

## 2017-07-20 ENCOUNTER — Encounter: Payer: Self-pay | Admitting: Family Medicine

## 2017-07-20 ENCOUNTER — Ambulatory Visit (INDEPENDENT_AMBULATORY_CARE_PROVIDER_SITE_OTHER): Payer: Medicare Other | Admitting: Family Medicine

## 2017-07-20 VITALS — BP 144/82 | HR 93 | Temp 98.1°F | Resp 16 | Wt 153.0 lb

## 2017-07-20 DIAGNOSIS — E538 Deficiency of other specified B group vitamins: Secondary | ICD-10-CM

## 2017-07-20 DIAGNOSIS — M546 Pain in thoracic spine: Secondary | ICD-10-CM | POA: Diagnosis not present

## 2017-07-20 DIAGNOSIS — H8112 Benign paroxysmal vertigo, left ear: Secondary | ICD-10-CM | POA: Diagnosis not present

## 2017-07-20 DIAGNOSIS — I10 Essential (primary) hypertension: Secondary | ICD-10-CM | POA: Diagnosis not present

## 2017-07-20 MED ORDER — CYANOCOBALAMIN 1000 MCG/ML IJ SOLN
1000.0000 ug | Freq: Once | INTRAMUSCULAR | Status: AC
  Administered 2017-07-20: 1000 ug via INTRAMUSCULAR

## 2017-07-20 MED ORDER — MECLIZINE HCL 25 MG PO TABS: 25.0000 mg | ORAL_TABLET | Freq: Three times a day (TID) | ORAL | 0 refills | Status: AC | PRN

## 2017-07-20 NOTE — Patient Instructions (Signed)

## 2017-07-20 NOTE — Progress Notes (Signed)
Patient: Jade Gardner Female    DOB: 07-11-1945   72 y.o.   MRN: [id number] Visit Date: 07/21/2017  Today's Provider: Lavon Paganini, MD   I, Martha Clan, CMA, am acting as scribe for Lavon Paganini, MD.  Chief Complaint  Patient presents with  . Hypertension  . Hypothyroidism   Subjective:    HPI      Hypertension, follow-up:  BP Readings from Last 3 Encounters:  07/20/17 (!) 144/82  05/28/17 130/64  05/26/17 (!) 109/56    She was last seen for hypertension 2 months ago.  BP at that visit was 126/72. Management since that visit includes checking labs (calcium was elevated, and pt was advised to D/C any calcium supplements and recheck at next OV). Pt had to D/C amlodipine due to swelling. She is exercising. Walking every morning. She is adherent to low salt diet.   She is experiencing fatigue, near-syncope and palpitations.  Patient denies chest pain, chest pressure/discomfort, claudication, dyspnea, exertional chest pressure/discomfort, irregular heart beat, lower extremity edema, orthopnea and syncope.   Cardiovascular risk factors include advanced age (older than 59 for men, 36 for women), dyslipidemia and hypertension.  Use of agents associated with hypertension: none.     Weight trend: stable Wt Readings from Last 3 Encounters:  07/20/17 153 lb (69.4 kg)  05/28/17 150 lb (68 kg)  05/26/17 150 lb (68 kg)    Current diet: in general, a "healthy" diet    ------------------------------------------------------------------------  Patient is due for B12 injection today.    Pt has noticed heart palpitations about 2 weeks ago that has since resolved. She is now experiencing headache and dizziness that has been occurring for about 2 days.  She also noticed upper back pain that has been occurring for the last 2 weeks. She checked her BP last night when she was having a dizzy spell, and it was 137/79. This morning her BP was 119/56 prior to taking BP  medication.  She hasn't noticed if anything makes it better or worse.  She feels unstable when walking.  Feels room spinning.    Allergies  Allergen Reactions  . Fish Oil Itching    Other reaction(s): Other (See Comments) Burning and stinging  . Simvastatin     Other reaction(s): Muscle Pain Other reaction(s): Other (See Comments) Joint pain     Current Outpatient Medications:  .  Ascorbic Acid (VITAMIN C) 1000 MG tablet, Take 1,000 mg by mouth daily. am, Disp: , Rfl:  .  aspirin 81 MG tablet, Take 81 mg by mouth daily. pm, Disp: , Rfl:  .  cholecalciferol (VITAMIN D) 1000 units tablet, Take 1 tablet (1,000 Units total) by mouth daily., Disp: , Rfl:  .  clopidogrel (PLAVIX) 75 MG tablet, TAKE 1 TABLET BY MOUTH  EVERY DAY WITH BREAKFAST, Disp: 90 tablet, Rfl: 3 .  Cranberry 500 MG CAPS, Take by mouth. am, Disp: , Rfl:  .  cyclobenzaprine (FLEXERIL) 10 MG tablet, Take 0.5-1 tablets (5-10 mg total) by mouth at bedtime as needed for muscle spasms., Disp: 90 tablet, Rfl: 1 .  Diphenhydramine-APAP (PERCOGESIC EXTRA STRENGTH) 12.5-500 MG TABS, Take by mouth daily. pm, Disp: , Rfl:  .  ezetimibe (ZETIA) 10 MG tablet, Take 1 tablet (10 mg total) by mouth daily., Disp: 90 tablet, Rfl: 3 .  fluticasone (FLONASE) 50 MCG/ACT nasal spray, Place 2 sprays into both nostrils daily. (Patient taking differently: Place 2 sprays into both nostrils as needed. pm), Disp: 16 g,  Rfl: 2 .  hydrochlorothiazide (HYDRODIURIL) 25 MG tablet, Take 0.5 tablets (12.5 mg total) by mouth daily. (Patient taking differently: Take 12.5 mg by mouth daily. am), Disp: 15 tablet, Rfl: 2 .  ibandronate (BONIVA) 150 MG tablet, TAKE 1 TABLET BY MOUTH MONTHLY- DRINK WITH FULL GLASS OF WATER, ON AN EMPTY STOMACH AND WITH NO OTHER FOOD/DRINK/MEDICATION, Disp: 1 tablet, Rfl: 12 .  ibuprofen (ADVIL,MOTRIN) 200 MG tablet, Take 200 mg by mouth every 6 (six) hours as needed., Disp: , Rfl:  .  lovastatin (MEVACOR) 20 MG tablet, TAKE 1 TABLET  BY MOUTH AT  BEDTIME, Disp: 90 tablet, Rfl: 3 .  metoprolol succinate (TOPROL-XL) 50 MG 24 hr tablet, TAKE 1 AND 1/2 TABLETS BY  MOUTH DAILY, Disp: 45 tablet, Rfl: 0 .  montelukast (SINGULAIR) 10 MG tablet, TAKE 1 TABLET BY MOUTH AT  BEDTIME, Disp: 90 tablet, Rfl: 3 .  ondansetron (ZOFRAN ODT) 4 MG disintegrating tablet, Take 1 tablet (4 mg total) by mouth every 6 (six) hours as needed for nausea or vomiting., Disp: 20 tablet, Rfl: 0 .  pantoprazole (PROTONIX) 40 MG tablet, TAKE 1 TABLET BY MOUTH  DAILY, Disp: 90 tablet, Rfl: 3 .  meclizine (ANTIVERT) 25 MG tablet, Take 1 tablet (25 mg total) by mouth 3 (three) times daily as needed for dizziness., Disp: 60 tablet, Rfl: 0  Review of Systems  Constitutional: Positive for activity change and fatigue. Negative for appetite change, chills, diaphoresis, fever and unexpected weight change.  Respiratory: Negative for shortness of breath.   Cardiovascular: Positive for palpitations. Negative for chest pain and leg swelling.  Endocrine: Negative for cold intolerance and heat intolerance.  Musculoskeletal: Positive for back pain.  Neurological: Positive for dizziness and headaches. Negative for syncope.    Social History   Tobacco Use  . Smoking status: Never Smoker  . Smokeless tobacco: Never Used  Substance Use Topics  . Alcohol use: No   Objective:   BP (!) 144/82 (BP Location: Left Arm, Patient Position: Sitting, Cuff Size: Large)   Pulse 93   Temp 98.1 F (36.7 C) (Oral)   Resp 16   Wt 153 lb (69.4 kg)   SpO2 98%   BMI 27.54 kg/m  Vitals:   07/20/17 0847  BP: (!) 144/82  Pulse: 93  Resp: 16  Temp: 98.1 F (36.7 C)  TempSrc: Oral  SpO2: 98%  Weight: 153 lb (69.4 kg)     Physical Exam  Constitutional: She is oriented to person, place, and time. She appears well-developed and well-nourished. No distress.  HENT:  Head: Normocephalic and atraumatic.  Right Ear: External ear normal.  Left Ear: External ear normal.  Nose:  Nose normal.  Mouth/Throat: Oropharynx is clear and moist. No oropharyngeal exudate.  Eyes: Pupils are equal, round, and reactive to light. Conjunctivae and EOM are normal. Right eye exhibits no discharge. Left eye exhibits no discharge. No scleral icterus.  Leftward gaze mimics symptoms of vertigo that patient has felt previously  Neck: Neck supple. No thyromegaly present.  Cardiovascular: Normal rate, regular rhythm, normal heart sounds and intact distal pulses.  No murmur heard. Pulmonary/Chest: Effort normal and breath sounds normal. No respiratory distress. She has no wheezes. She has no rales.  Musculoskeletal: She exhibits no edema or deformity.  Strength intact throughout upper and lower extremities Back: No midline tenderness palpation over spinous processes.  Tenderness of musculature on left side of thoracic back.  No tenderness on right side.  Range of motion intact.  Lymphadenopathy:  She has no cervical adenopathy.  Neurological: She is alert and oriented to person, place, and time. No cranial nerve deficit or sensory deficit. She exhibits normal muscle tone. Coordination normal.  Skin: Skin is warm and dry. Capillary refill takes less than 2 seconds. No rash noted.  Psychiatric: She has a normal mood and affect. Her speech is normal and behavior is normal. Thought content normal.  Vitals reviewed.      Assessment & Plan:     1. Benign essential HTN -Well-controlled -Continue current medications -Reviewed recent labs  2. B12 deficiency - cyanocobalamin ((VITAMIN B-12)) injection 1,000 mcg  3. Benign paroxysmal positional vertigo of left ear -Neuro intact without any signs of CVA -No hearing loss to suggest Mnire's disease - Given that leftward gaze produces vertigo symptoms, this is consistent with BPPV - Trial of meclizine -Given fall precautions -Consider vestibular PT in the future -Return precautions discussed  4. Acute left-sided thoracic back pain -No  midline tenderness to suggest bony abnormalities -Pain is over musculature on left side-this is consistent with musculoskeletal pain -Suggested ice, NSAIDs, rest    Meds ordered this encounter  Medications  . meclizine (ANTIVERT) 25 MG tablet    Sig: Take 1 tablet (25 mg total) by mouth 3 (three) times daily as needed for dizziness.    Dispense:  60 tablet    Refill:  0  . cyanocobalamin ((VITAMIN B-12)) injection 1,000 mcg     The entirety of the information documented in the History of Present Illness, Review of Systems and Physical Exam were personally obtained by me. Portions of this information were initially documented by Raquel Sarna Ratchford, CMA and reviewed by me for thoroughness and accuracy.    [name], MD, MPH [facility] 07/21/2017 1:44 PM

## 2017-07-21 NOTE — Assessment & Plan Note (Signed)
Well-controlled Continue current medications Recent labs reviewed

## 2017-07-23 ENCOUNTER — Ambulatory Visit: Payer: Medicare Other

## 2017-07-26 ENCOUNTER — Other Ambulatory Visit: Payer: Self-pay | Admitting: Family Medicine

## 2017-08-05 DIAGNOSIS — H2512 Age-related nuclear cataract, left eye: Secondary | ICD-10-CM | POA: Diagnosis not present

## 2017-08-10 ENCOUNTER — Inpatient Hospital Stay: Admission: RE | Admit: 2017-08-10 | Payer: Medicare Other | Source: Ambulatory Visit

## 2017-08-13 NOTE — Discharge Instructions (Signed)

## 2017-08-16 ENCOUNTER — Other Ambulatory Visit: Payer: Self-pay

## 2017-08-16 ENCOUNTER — Encounter: Payer: Self-pay | Admitting: *Deleted

## 2017-08-16 ENCOUNTER — Ambulatory Visit
Admission: RE | Admit: 2017-08-16 | Discharge: 2017-08-16 | Disposition: A | Discharge status: Home / Self Care | Payer: Medicare Other | Source: Ambulatory Visit | Attending: Family Medicine | Admitting: Family Medicine

## 2017-08-16 DIAGNOSIS — Z1239 Encounter for other screening for malignant neoplasm of breast: Secondary | ICD-10-CM

## 2017-08-16 DIAGNOSIS — Z1231 Encounter for screening mammogram for malignant neoplasm of breast: Secondary | ICD-10-CM | POA: Insufficient documentation

## 2017-08-17 ENCOUNTER — Encounter: Payer: Self-pay | Admitting: Family Medicine

## 2017-08-17 ENCOUNTER — Ambulatory Visit (INDEPENDENT_AMBULATORY_CARE_PROVIDER_SITE_OTHER): Payer: Medicare Other | Admitting: Family Medicine

## 2017-08-17 ENCOUNTER — Telehealth: Payer: Self-pay

## 2017-08-17 VITALS — BP 116/72 | HR 70 | Temp 98.0°F | Resp 16 | Wt 152.0 lb

## 2017-08-17 DIAGNOSIS — I10 Essential (primary) hypertension: Secondary | ICD-10-CM | POA: Diagnosis not present

## 2017-08-17 NOTE — Telephone Encounter (Signed)
-----   Message from [name], MD sent at 08/17/2017 11:11 AM EDT ----- Normal mammogram.  Repeat in 1 yr.  [name], MD, MPH [facility] 08/17/2017 11:11 AM

## 2017-08-17 NOTE — Telephone Encounter (Signed)
Viewed by Lucilla Lame on 08/17/2017 11:36 AM

## 2017-08-17 NOTE — Progress Notes (Signed)
Patient: Jade Gardner Female    DOB: [birth year]   72 y.o.   MRN: [id number] Visit Date: 08/17/2017  Today's Provider: Lavon Paganini, MD   I, Martha Clan, CMA, am acting as scribe for Lavon Paganini, MD.   Chief Complaint  Patient presents with  . Hypertension   Subjective:    HPI      Hypertension, follow-up:  BP Readings from Last 3 Encounters:  08/17/17 116/72  07/20/17 (!) 144/82  05/28/17 130/64    She was last seen for hypertension 4 weeks ago.  BP at that visit was 144/82. Management since that visit includes continuing medications. She reports fair compliance with treatment. She stopped taking HCTZ due to hypotension. She is exercising. Is planning to join daughter at the gym in the mornings. She is adherent to low salt diet.   Outside blood pressures are low; they are ranging form 91-135/51-76. She is experiencing fatigue, irregular heart beat and headaches.  Patient denies chest pain, chest pressure/discomfort, claudication, dyspnea, exertional chest pressure/discomfort, lower extremity edema, near-syncope, orthopnea, palpitations and syncope.   Cardiovascular risk factors include advanced age (older than 82 for men, 70 for women), dyslipidemia and hypertension.  Use of agents associated with hypertension: NSAIDS.     Weight trend: stable Wt Readings from Last 3 Encounters:  08/17/17 152 lb (68.9 kg)  07/20/17 153 lb (69.4 kg)  05/28/17 150 lb (68 kg)    Current diet: in general, a "healthy" diet    ------------------------------------------------------------------------ Pt has cataract surgery scheduled for tomorrow, and pt was advised to discuss low BP with PCP.  HCTZ was started about 3 months ago and that is when blood pressure started dropping.  Blood pressure has improved from 90s over 50s to 110s over 70s since stopping HCTZ.   Allergies  Allergen Reactions  . Fish Oil Itching    Burning and stinging  . Simvastatin    Muscle Pain, Joint pain     Current Outpatient Medications:  .  aspirin 81 MG tablet, Take 81 mg by mouth daily. pm, Disp: , Rfl:  .  clopidogrel (PLAVIX) 75 MG tablet, TAKE 1 TABLET BY MOUTH  EVERY DAY WITH BREAKFAST, Disp: 90 tablet, Rfl: 3 .  cyclobenzaprine (FLEXERIL) 10 MG tablet, Take 0.5-1 tablets (5-10 mg total) by mouth at bedtime as needed for muscle spasms., Disp: 90 tablet, Rfl: 1 .  ezetimibe (ZETIA) 10 MG tablet, Take 1 tablet (10 mg total) by mouth daily., Disp: 90 tablet, Rfl: 3 .  fluticasone (FLONASE) 50 MCG/ACT nasal spray, Place 2 sprays into both nostrils daily. (Patient taking differently: Place 2 sprays into both nostrils as needed. pm), Disp: 16 g, Rfl: 2 .  ibandronate (BONIVA) 150 MG tablet, TAKE 1 TABLET BY MOUTH MONTHLY- DRINK WITH FULL GLASS OF WATER, ON AN EMPTY STOMACH AND WITH NO OTHER FOOD/DRINK/MEDICATION, Disp: 1 tablet, Rfl: 12 .  ibuprofen (ADVIL,MOTRIN) 200 MG tablet, Take 200 mg by mouth every 6 (six) hours as needed., Disp: , Rfl:  .  lovastatin (MEVACOR) 20 MG tablet, TAKE 1 TABLET BY MOUTH AT  BEDTIME, Disp: 90 tablet, Rfl: 3 .  meclizine (ANTIVERT) 25 MG tablet, Take 1 tablet (25 mg total) by mouth 3 (three) times daily as needed for dizziness., Disp: 60 tablet, Rfl: 0 .  metoprolol succinate (TOPROL-XL) 50 MG 24 hr tablet, TAKE 1 AND 1/2 TABLETS BY  MOUTH DAILY, Disp: 45 tablet, Rfl: 0 .  montelukast (SINGULAIR) 10 MG tablet, TAKE 1 TABLET  BY MOUTH AT  BEDTIME, Disp: 90 tablet, Rfl: 3 .  pantoprazole (PROTONIX) 40 MG tablet, TAKE 1 TABLET BY MOUTH  DAILY, Disp: 90 tablet, Rfl: 3 .  Ascorbic Acid (VITAMIN C) 1000 MG tablet, Take 1,000 mg by mouth daily. am, Disp: , Rfl:  .  cholecalciferol (VITAMIN D) 1000 units tablet, Take 1 tablet (1,000 Units total) by mouth daily. (Patient not taking: Reported on 08/16/2017), Disp: , Rfl:  .  Cranberry 500 MG CAPS, Take by mouth. am, Disp: , Rfl:  .  hydrochlorothiazide (HYDRODIURIL) 25 MG tablet, TAKE 1/2  TABLET(12.5 MG) BY MOUTH DAILY (Patient not taking: Reported on 08/17/2017), Disp: 45 tablet, Rfl: 2 .  ondansetron (ZOFRAN ODT) 4 MG disintegrating tablet, Take 1 tablet (4 mg total) by mouth every 6 (six) hours as needed for nausea or vomiting. (Patient not taking: Reported on 08/17/2017), Disp: 20 tablet, Rfl: 0  Review of Systems  Constitutional: Positive for fatigue. Negative for activity change, appetite change, chills, diaphoresis, fever and unexpected weight change.  HENT: Negative.   Respiratory: Negative.  Negative for shortness of breath.   Cardiovascular: Positive for palpitations. Negative for chest pain and leg swelling.  Gastrointestinal: Negative.   Genitourinary: Negative.   Musculoskeletal: Negative.   Skin: Negative.   Neurological: Positive for light-headedness and headaches. Negative for dizziness, tremors, syncope, speech difficulty, weakness and numbness.  Hematological: Negative.   Psychiatric/Behavioral: Negative.     Social History   Tobacco Use  . Smoking status: Never Smoker  . Smokeless tobacco: Never Used  Substance Use Topics  . Alcohol use: No   Objective:   BP 116/72 (BP Location: Left Arm, Patient Position: Sitting, Cuff Size: Large)   Pulse 70   Temp 98 F (36.7 C) (Oral)   Resp 16   Wt 152 lb (68.9 kg)   SpO2 95%   BMI 27.36 kg/m  Vitals:   08/17/17 0944  BP: 116/72  Pulse: 70  Resp: 16  Temp: 98 F (36.7 C)  TempSrc: Oral  SpO2: 95%  Weight: 152 lb (68.9 kg)     Physical Exam  Constitutional: She is oriented to person, place, and time. She appears well-developed and well-nourished. No distress.  HENT:  Head: Normocephalic and atraumatic.  Mouth/Throat: Oropharynx is clear and moist.  Eyes: Conjunctivae are normal. No scleral icterus.  Neck: Neck supple. No thyromegaly present.  Cardiovascular: Normal rate, regular rhythm, normal heart sounds and intact distal pulses.  No murmur heard. Pulmonary/Chest: Effort normal and breath  sounds normal. No respiratory distress. She has no wheezes. She has no rales.  Musculoskeletal: She exhibits no edema.  Lymphadenopathy:    She has no cervical adenopathy.  Neurological: She is alert and oriented to person, place, and time.  Skin: Skin is warm and dry. Capillary refill takes less than 2 seconds. No rash noted.  Psychiatric: She has a normal mood and affect. Her behavior is normal.  Vitals reviewed.      Assessment & Plan:   Problem List Items Addressed This Visit      Cardiovascular and Mediastinum   Benign essential HTN - Primary    Currently with symptomatic hypotension This has been improving since stopping HCTZ We will continue to hold HCTZ She can continue Toprol-XL 50 mg daily as her pulse is appropriate She is okay to have her surgery tomorrow         Return if symptoms worsen or fail to improve.   The entirety of the information documented in  the History of Present Illness, Review of Systems and Physical Exam were personally obtained by me. Portions of this information were initially documented by Raquel Sarna Ratchford, CMA and reviewed by me for thoroughness and accuracy.    [name], MD, MPH [facility] 08/18/2017 9:10 AM

## 2017-08-17 NOTE — Patient Instructions (Signed)
Stop HCTZ  Continue 1 pill of Metoprolol daily  OK to have cataract surgery  Be sure to stay well hydrated  Call if BP drops again

## 2017-08-18 ENCOUNTER — Encounter
Admission: RE | Disposition: A | Discharge status: Home / Self Care | Payer: Self-pay | Source: Ambulatory Visit | Attending: Ophthalmology

## 2017-08-18 ENCOUNTER — Ambulatory Visit
Admission: RE | Admit: 2017-08-18 | Discharge: 2017-08-18 | Disposition: A | Discharge status: Home / Self Care | Payer: Medicare Other | Source: Ambulatory Visit | Attending: Ophthalmology | Admitting: Ophthalmology

## 2017-08-18 ENCOUNTER — Ambulatory Visit: Payer: Medicare Other | Admitting: Anesthesiology

## 2017-08-18 DIAGNOSIS — G4730 Sleep apnea, unspecified: Secondary | ICD-10-CM | POA: Diagnosis not present

## 2017-08-18 DIAGNOSIS — H25812 Combined forms of age-related cataract, left eye: Secondary | ICD-10-CM | POA: Diagnosis not present

## 2017-08-18 DIAGNOSIS — I739 Peripheral vascular disease, unspecified: Secondary | ICD-10-CM | POA: Insufficient documentation

## 2017-08-18 DIAGNOSIS — Z9841 Cataract extraction status, right eye: Secondary | ICD-10-CM | POA: Diagnosis not present

## 2017-08-18 DIAGNOSIS — H2512 Age-related nuclear cataract, left eye: Secondary | ICD-10-CM | POA: Insufficient documentation

## 2017-08-18 DIAGNOSIS — Z8673 Personal history of transient ischemic attack (TIA), and cerebral infarction without residual deficits: Secondary | ICD-10-CM | POA: Diagnosis not present

## 2017-08-18 DIAGNOSIS — I10 Essential (primary) hypertension: Secondary | ICD-10-CM | POA: Insufficient documentation

## 2017-08-18 HISTORY — PX: CATARACT EXTRACTION W/PHACO: SHX586

## 2017-08-18 HISTORY — DX: Dizziness and giddiness: R42

## 2017-08-18 SURGERY — PHACOEMULSIFICATION, CATARACT, WITH IOL INSERTION
Anesthesia: Monitor Anesthesia Care | Laterality: Left | Wound class: "Clean "

## 2017-08-18 MED ORDER — MIDAZOLAM HCL 2 MG/2ML IJ SOLN
INTRAMUSCULAR | Status: DC | PRN
  Administered 2017-08-18: 1.5 mg via INTRAVENOUS

## 2017-08-18 MED ORDER — MOXIFLOXACIN HCL 0.5 % OP SOLN
1.0000 [drp] | OPHTHALMIC | Status: DC | PRN
Start: 2017-08-18 — End: 2017-08-18
  Administered 2017-08-18 (×3): 1 [drp] via OPHTHALMIC

## 2017-08-18 MED ORDER — ACETAMINOPHEN 325 MG PO TABS: 325.0000 mg | ORAL_TABLET | ORAL | Status: DC | PRN

## 2017-08-18 MED ORDER — ARMC OPHTHALMIC DILATING DROPS
1.0000 "application " | OPHTHALMIC | Status: DC | PRN
  Administered 2017-08-18 (×3): 1 via OPHTHALMIC

## 2017-08-18 MED ORDER — BALANCED SALT IO SOLN
INTRAOCULAR | Status: DC | PRN
  Administered 2017-08-18: 1 mL via INTRAMUSCULAR

## 2017-08-18 MED ORDER — CEFUROXIME OPHTHALMIC INJECTION 1 MG/0.1 ML
INJECTION | OPHTHALMIC | Status: DC | PRN
Start: 2017-08-18 — End: 2017-08-18
  Administered 2017-08-18: 0.1 mL via INTRACAMERAL

## 2017-08-18 MED ORDER — LACTATED RINGERS IV SOLN: INTRAVENOUS | Status: DC

## 2017-08-18 MED ORDER — BRIMONIDINE TARTRATE-TIMOLOL 0.2-0.5 % OP SOLN
OPHTHALMIC | Status: DC | PRN
  Administered 2017-08-18: 1 [drp] via OPHTHALMIC

## 2017-08-18 MED ORDER — ACETAMINOPHEN 160 MG/5ML PO SOLN: 325.0000 mg | ORAL | Status: DC | PRN

## 2017-08-18 MED ORDER — EPINEPHRINE PF 1 MG/ML IJ SOLN
INTRAOCULAR | Status: DC | PRN
  Administered 2017-08-18: 75 mL via OPHTHALMIC

## 2017-08-18 MED ORDER — FENTANYL CITRATE (PF) 100 MCG/2ML IJ SOLN
INTRAMUSCULAR | Status: DC | PRN
  Administered 2017-08-18: 50 ug via INTRAVENOUS

## 2017-08-18 MED ORDER — NA HYALUR & NA CHOND-NA HYALUR 0.4-0.35 ML IO KIT
PACK | INTRAOCULAR | Status: DC | PRN
  Administered 2017-08-18: 1 mL via INTRAOCULAR

## 2017-08-18 SURGICAL SUPPLY — 27 items
CANNULA ANT/CHMB 27G (MISCELLANEOUS) ×1 IMPLANT
CANNULA ANT/CHMB 27GA (MISCELLANEOUS) ×3 IMPLANT
CARTRIDGE ABBOTT (MISCELLANEOUS) IMPLANT
GLOVE SURG LX 7.5 STRW (GLOVE) ×2
GLOVE SURG LX STRL 7.5 STRW (GLOVE) ×1 IMPLANT
GLOVE SURG TRIUMPH 8.0 PF LTX (GLOVE) ×3 IMPLANT
GOWN STRL REUS W/ TWL LRG LVL3 (GOWN DISPOSABLE) ×2 IMPLANT
GOWN STRL REUS W/TWL LRG LVL3 (GOWN DISPOSABLE) ×4
LENS IOL TECNIS ITEC 22.0 (Intraocular Lens) ×2 IMPLANT
MARKER SKIN DUAL TIP RULER LAB (MISCELLANEOUS) ×3 IMPLANT
NDL FILTER BLUNT 18X1 1/2 (NEEDLE) ×1 IMPLANT
NDL RETROBULBAR .5 NSTRL (NEEDLE) IMPLANT
NEEDLE FILTER BLUNT 18X 1/2SAF (NEEDLE) ×2
NEEDLE FILTER BLUNT 18X1 1/2 (NEEDLE) ×1 IMPLANT
PACK CATARACT BRASINGTON (MISCELLANEOUS) ×3 IMPLANT
PACK EYE AFTER SURG (MISCELLANEOUS) ×3 IMPLANT
PACK OPTHALMIC (MISCELLANEOUS) ×3 IMPLANT
RING MALYGIN 7.0 (MISCELLANEOUS) IMPLANT
SUT ETHILON 10-0 CS-B-6CS-B-6 (SUTURE)
SUT VICRYL  9 0 (SUTURE)
SUT VICRYL 9 0 (SUTURE) IMPLANT
SUTURE EHLN 10-0 CS-B-6CS-B-6 (SUTURE) IMPLANT
SYR 3ML LL SCALE MARK (SYRINGE) ×3 IMPLANT
SYR 5ML LL (SYRINGE) ×3 IMPLANT
SYR TB 1ML LUER SLIP (SYRINGE) ×3 IMPLANT
WATER STERILE IRR 500ML POUR (IV SOLUTION) ×3 IMPLANT
WIPE NON LINTING 3.25X3.25 (MISCELLANEOUS) ×3 IMPLANT

## 2017-08-18 NOTE — Op Note (Signed)
OPERATIVE NOTE  Jade Gardner [id number] 08/18/2017   PREOPERATIVE DIAGNOSIS:  Nuclear sclerotic cataract left eye. H25.12   POSTOPERATIVE DIAGNOSIS:    Nuclear sclerotic cataract left eye.     PROCEDURE:  Phacoemusification with posterior chamber intraocular lens placement of the left eye   LENS:   Implant Name Type Inv. Item Serial No. Manufacturer Lot No. LRB No. Used  LENS IOL DIOP 22.0 - [id number] Intraocular Lens LENS IOL DIOP 22.0 [id number] AMO  Left 1        ULTRASOUND TIME: 16  % of 1 minutes 20 seconds, CDE 13.0  SURGEON:  Wyonia Hough, MD   ANESTHESIA:  Topical with tetracaine drops and 2% Xylocaine jelly, augmented with 1% preservative-free intracameral lidocaine.    COMPLICATIONS:  None.   DESCRIPTION OF PROCEDURE:  The patient was identified in the holding room and transported to the operating room and placed in the supine position under the operating microscope.  The left eye was identified as the operative eye and it was prepped and draped in the usual sterile ophthalmic fashion.   A 1 millimeter clear-corneal paracentesis was made at the 1:30 position.  0.5 ml of preservative-free 1% lidocaine was injected into the anterior chamber.  The anterior chamber was filled with Viscoat viscoelastic.  A 2.4 millimeter keratome was used to make a near-clear corneal incision at the 10:30 position.  .  A curvilinear capsulorrhexis was made with a cystotome and capsulorrhexis forceps.  Balanced salt solution was used to hydrodissect and hydrodelineate the nucleus.   Phacoemulsification was then used in stop and chop fashion to remove the lens nucleus and epinucleus.  The remaining cortex was then removed using the irrigation and aspiration handpiece. Provisc was then placed into the capsular bag to distend it for lens placement.  A lens was then injected into the capsular bag.  The remaining viscoelastic was aspirated.   Wounds were hydrated with balanced salt  solution.  The anterior chamber was inflated to a physiologic pressure with balanced salt solution.  No wound leaks were noted. Cefuroxime 0.1 ml of a 28m/ml solution was injected into the anterior chamber for a dose of 1 mg of intracameral antibiotic at the completion of the case.   Timolol and Brimonidine drops were applied to the eye.  The patient was taken to the recovery room in stable condition without complications of anesthesia or surgery.  [name] 08/18/2017, 1:55 PM

## 2017-08-18 NOTE — Anesthesia Preprocedure Evaluation (Signed)
Anesthesia Evaluation  Patient identified by MRN, date of birth, ID band Patient awake    Reviewed: Allergy & Precautions, H&P , NPO status , Patient's Chart, lab work & pertinent test results, reviewed documented beta blocker date and time   Airway Mallampati: II  TM Distance: >3 FB Neck ROM: full    Dental no notable dental hx.    Pulmonary sleep apnea ,    Pulmonary exam normal breath sounds clear to auscultation       Cardiovascular Exercise Tolerance: Good hypertension, + Peripheral Vascular Disease  Normal cardiovascular exam+ dysrhythmias Supra Ventricular Tachycardia  Rhythm:regular Rate:Normal     Neuro/Psych TIAnegative psych ROS   GI/Hepatic negative GI ROS, Neg liver ROS, GERD  ,  Endo/Other  negative endocrine ROS  Renal/GU negative Renal ROS  negative genitourinary   Musculoskeletal   Abdominal   Peds  Hematology  (+) anemia ,   Anesthesia Other Findings   Reproductive/Obstetrics negative OB ROS                             Anesthesia Physical Anesthesia Plan  ASA: II  Anesthesia Plan: MAC   Post-op Pain Management:    Induction:   PONV Risk Score and Plan:   Airway Management Planned:   Additional Equipment:   Intra-op Plan:   Post-operative Plan:   Informed Consent: I have reviewed the patients History and Physical, chart, labs and discussed the procedure including the risks, benefits and alternatives for the proposed anesthesia with the patient or authorized representative who has indicated his/her understanding and acceptance.   Dental Advisory Given  Plan Discussed with: CRNA and Anesthesiologist  Anesthesia Plan Comments:         Anesthesia Quick Evaluation

## 2017-08-18 NOTE — Anesthesia Procedure Notes (Signed)
Procedure Name: MAC Performed by: Mysha Peeler, CRNA Pre-anesthesia Checklist: Patient identified, Emergency Drugs available, Suction available, Timeout performed and Patient being monitored Patient Re-evaluated:Patient Re-evaluated prior to induction Oxygen Delivery Method: Nasal cannula Placement Confirmation: positive ETCO2       

## 2017-08-18 NOTE — H&P (Signed)
The History and Physical notes are on paper, have been signed, and are to be scanned. The patient remains stable and unchanged from the H&P.   Previous H&P reviewed, patient examined, and there are no changes.  Jade Gardner 08/18/2017 1:13 PM

## 2017-08-18 NOTE — Anesthesia Postprocedure Evaluation (Signed)
Anesthesia Post Note  Patient: Jade Gardner  Procedure(s) Performed: CATARACT EXTRACTION PHACO AND INTRAOCULAR LENS PLACEMENT (IOC) LEFT (Left )  Patient location during evaluation: PACU Anesthesia Type: MAC Level of consciousness: awake and alert Pain management: pain level controlled Vital Signs Assessment: post-procedure vital signs reviewed and stable Respiratory status: spontaneous breathing, nonlabored ventilation, respiratory function stable and patient connected to nasal cannula oxygen Cardiovascular status: stable and blood pressure returned to baseline Postop Assessment: no apparent nausea or vomiting Anesthetic complications: no    Trecia Rogers

## 2017-08-18 NOTE — Assessment & Plan Note (Signed)
Currently with symptomatic hypotension This has been improving since stopping HCTZ We will continue to hold HCTZ She can continue Toprol-XL 50 mg daily as her pulse is appropriate She is okay to have her surgery tomorrow

## 2017-08-18 NOTE — Transfer of Care (Signed)
Immediate Anesthesia Transfer of Care Note  Patient: Jade Gardner  Procedure(s) Performed: CATARACT EXTRACTION PHACO AND INTRAOCULAR LENS PLACEMENT (IOC) LEFT (Left )  Patient Location: PACU  Anesthesia Type: MAC  Level of Consciousness: awake, alert  and patient cooperative  Airway and Oxygen Therapy: Patient Spontanous Breathing and Patient connected to supplemental oxygen  Post-op Assessment: Post-op Vital signs reviewed, Patient's Cardiovascular Status Stable, Respiratory Function Stable, Patent Airway and No signs of Nausea or vomiting  Post-op Vital Signs: Reviewed and stable  Complications: No apparent anesthesia complications

## 2017-08-19 ENCOUNTER — Encounter: Payer: Self-pay | Admitting: Ophthalmology

## 2017-08-20 ENCOUNTER — Ambulatory Visit (INDEPENDENT_AMBULATORY_CARE_PROVIDER_SITE_OTHER): Payer: Medicare Other

## 2017-08-20 ENCOUNTER — Other Ambulatory Visit: Payer: Self-pay

## 2017-08-20 DIAGNOSIS — E538 Deficiency of other specified B group vitamins: Secondary | ICD-10-CM

## 2017-08-20 MED ORDER — METOPROLOL SUCCINATE ER 50 MG PO TB24: 50.0000 mg | ORAL_TABLET | Freq: Every day | ORAL | 3 refills | Status: DC

## 2017-08-20 MED ORDER — CYANOCOBALAMIN 1000 MCG/ML IJ SOLN
1000.0000 ug | Freq: Once | INTRAMUSCULAR | Status: AC
  Administered 2017-08-20: 1000 ug via INTRAMUSCULAR

## 2017-08-20 NOTE — Telephone Encounter (Signed)
Patient is requesting 90 day supply of her Metoprolol

## 2017-08-20 NOTE — Telephone Encounter (Signed)
Please send to [facility].

## 2017-08-25 ENCOUNTER — Ambulatory Visit (INDEPENDENT_AMBULATORY_CARE_PROVIDER_SITE_OTHER): Payer: Medicare Other | Admitting: Physician Assistant

## 2017-08-25 ENCOUNTER — Encounter: Payer: Self-pay | Admitting: Physician Assistant

## 2017-08-25 VITALS — BP 126/88 | HR 64 | Temp 98.2°F | Resp 16 | Wt 151.0 lb

## 2017-08-25 DIAGNOSIS — H9202 Otalgia, left ear: Secondary | ICD-10-CM | POA: Diagnosis not present

## 2017-08-25 NOTE — Progress Notes (Signed)
Patient: Jade Gardner Female    DOB: [birth year]   72 y.o.   MRN: [id number] Visit Date: 08/25/2017  Today's Provider: Trinna Post, PA-C   Chief Complaint  Patient presents with  . Foreign Body in Ear    Left ear; happened today.   Subjective:   Cleaned ear with q-tip, one broke off in her left ear and she thinks it's still there.  Foreign Body in [place]  The incident occurred 3 to 6 hours ago. The foreign body is suspected to be in the left ear. The incident was witnessed. The incident was witnessed/reported by the patient.       Allergies  Allergen Reactions  . Fish Oil Itching    Burning and stinging  . Simvastatin     Muscle Pain, Joint pain     Current Outpatient Medications:  .  Ascorbic Acid (VITAMIN C) 1000 MG tablet, Take 1,000 mg by mouth daily. am, Disp: , Rfl:  .  aspirin 81 MG tablet, Take 81 mg by mouth daily. pm, Disp: , Rfl:  .  cholecalciferol (VITAMIN D) 1000 units tablet, Take 1 tablet (1,000 Units total) by mouth daily. (Patient not taking: Reported on 08/16/2017), Disp: , Rfl:  .  clopidogrel (PLAVIX) 75 MG tablet, TAKE 1 TABLET BY MOUTH  EVERY DAY WITH BREAKFAST, Disp: 90 tablet, Rfl: 3 .  Cranberry 500 MG CAPS, Take by mouth. am, Disp: , Rfl:  .  cyclobenzaprine (FLEXERIL) 10 MG tablet, Take 0.5-1 tablets (5-10 mg total) by mouth at bedtime as needed for muscle spasms., Disp: 90 tablet, Rfl: 1 .  ezetimibe (ZETIA) 10 MG tablet, Take 1 tablet (10 mg total) by mouth daily., Disp: 90 tablet, Rfl: 3 .  fluticasone (FLONASE) 50 MCG/ACT nasal spray, Place 2 sprays into both nostrils daily. (Patient taking differently: Place 2 sprays into both nostrils as needed. pm), Disp: 16 g, Rfl: 2 .  hydrochlorothiazide (HYDRODIURIL) 25 MG tablet, TAKE 1/2 TABLET(12.5 MG) BY MOUTH DAILY (Patient not taking: Reported on 08/17/2017), Disp: 45 tablet, Rfl: 2 .  ibandronate (BONIVA) 150 MG tablet, TAKE 1 TABLET BY MOUTH MONTHLY- DRINK WITH FULL GLASS OF WATER, ON  AN EMPTY STOMACH AND WITH NO OTHER FOOD/DRINK/MEDICATION, Disp: 1 tablet, Rfl: 12 .  ibuprofen (ADVIL,MOTRIN) 200 MG tablet, Take 200 mg by mouth every 6 (six) hours as needed., Disp: , Rfl:  .  lovastatin (MEVACOR) 20 MG tablet, TAKE 1 TABLET BY MOUTH AT  BEDTIME, Disp: 90 tablet, Rfl: 3 .  meclizine (ANTIVERT) 25 MG tablet, Take 1 tablet (25 mg total) by mouth 3 (three) times daily as needed for dizziness., Disp: 60 tablet, Rfl: 0 .  metoprolol succinate (TOPROL-XL) 50 MG 24 hr tablet, Take 1 tablet (50 mg total) by mouth daily., Disp: 90 tablet, Rfl: 3 .  montelukast (SINGULAIR) 10 MG tablet, TAKE 1 TABLET BY MOUTH AT  BEDTIME, Disp: 90 tablet, Rfl: 3 .  ondansetron (ZOFRAN ODT) 4 MG disintegrating tablet, Take 1 tablet (4 mg total) by mouth every 6 (six) hours as needed for nausea or vomiting. (Patient not taking: Reported on 08/17/2017), Disp: 20 tablet, Rfl: 0 .  pantoprazole (PROTONIX) 40 MG tablet, TAKE 1 TABLET BY MOUTH  DAILY, Disp: 90 tablet, Rfl: 3  Review of Systems  Social History   Tobacco Use  . Smoking status: Never Smoker  . Smokeless tobacco: Never Used  Substance Use Topics  . Alcohol use: No   Objective:   BP  126/88 (BP Location: Right Arm, Patient Position: Sitting, Cuff Size: Normal)   Pulse 64   Temp 98.2 F (36.8 C) (Oral)   Resp 16   Wt 151 lb (68.5 kg)   BMI 27.18 kg/m  Vitals:   08/25/17 1618  BP: 126/88  Pulse: 64  Resp: 16  Temp: 98.2 F (36.8 C)  TempSrc: Oral  Weight: 151 lb (68.5 kg)     Physical Exam  Constitutional: She is oriented to person, place, and time. She appears well-developed and well-nourished.  HENT:  Right Ear: Hearing, tympanic membrane, external ear and ear canal normal.  Left Ear: Hearing, tympanic membrane, external ear and ear canal normal.  No foreign bodies noted in ears bilaterally.  Pulmonary/Chest: Effort normal and breath sounds normal.  Neurological: She is alert and oriented to person, place, and time.  Skin:  Skin is warm and dry.  Psychiatric: She has a normal mood and affect. Her behavior is normal.        Assessment & Plan:     1. Left ear pain  Do not see any q-tip. Advised not to use Q-tips and use debrox drops instead.  Return if symptoms worsen or fail to improve.  The entirety of the information documented in the History of Present Illness, Review of Systems and Physical Exam were personally obtained by me. Portions of this information were initially documented by Ashley Royalty, CMA and reviewed by me for thoroughness and accuracy.          Trinna Post, PA-C  [facility]

## 2017-08-25 NOTE — Patient Instructions (Signed)

## 2017-09-20 ENCOUNTER — Ambulatory Visit: Payer: Self-pay

## 2017-09-20 ENCOUNTER — Encounter: Payer: Self-pay | Admitting: Family Medicine

## 2017-09-23 ENCOUNTER — Other Ambulatory Visit: Payer: Self-pay | Admitting: Family Medicine

## 2017-09-27 DIAGNOSIS — Z961 Presence of intraocular lens: Secondary | ICD-10-CM | POA: Diagnosis not present

## 2017-11-24 IMAGING — CR DG FOOT COMPLETE 3+V*R*
1 series · 3 of 3 positions shown · non-contrast
Comparison: No recent prior .

CLINICAL DATA: Injury.  Pain .

EXAM:
RIGHT FOOT COMPLETE - 3+ VIEW

[Series 1: dg foot complete right · 0.14mm/px · 3 of 3 slices shown]
[im 1/3]
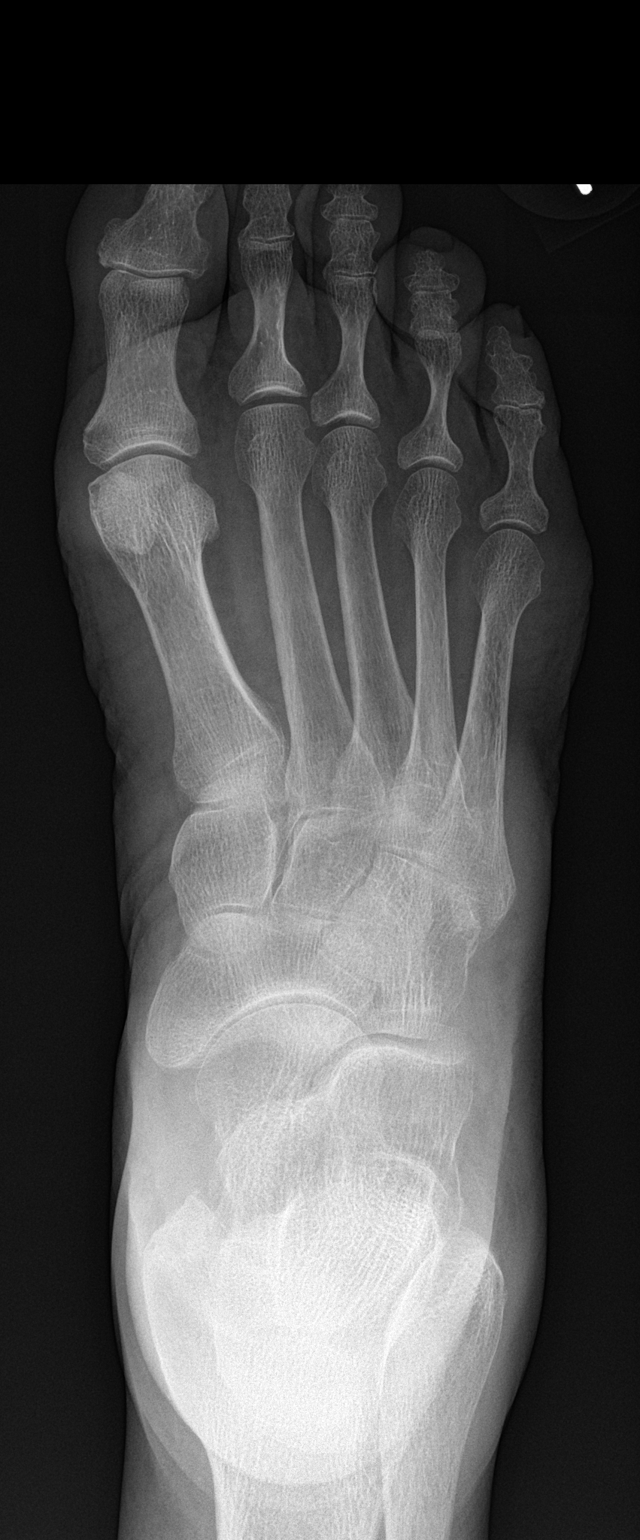
[im 2/3]
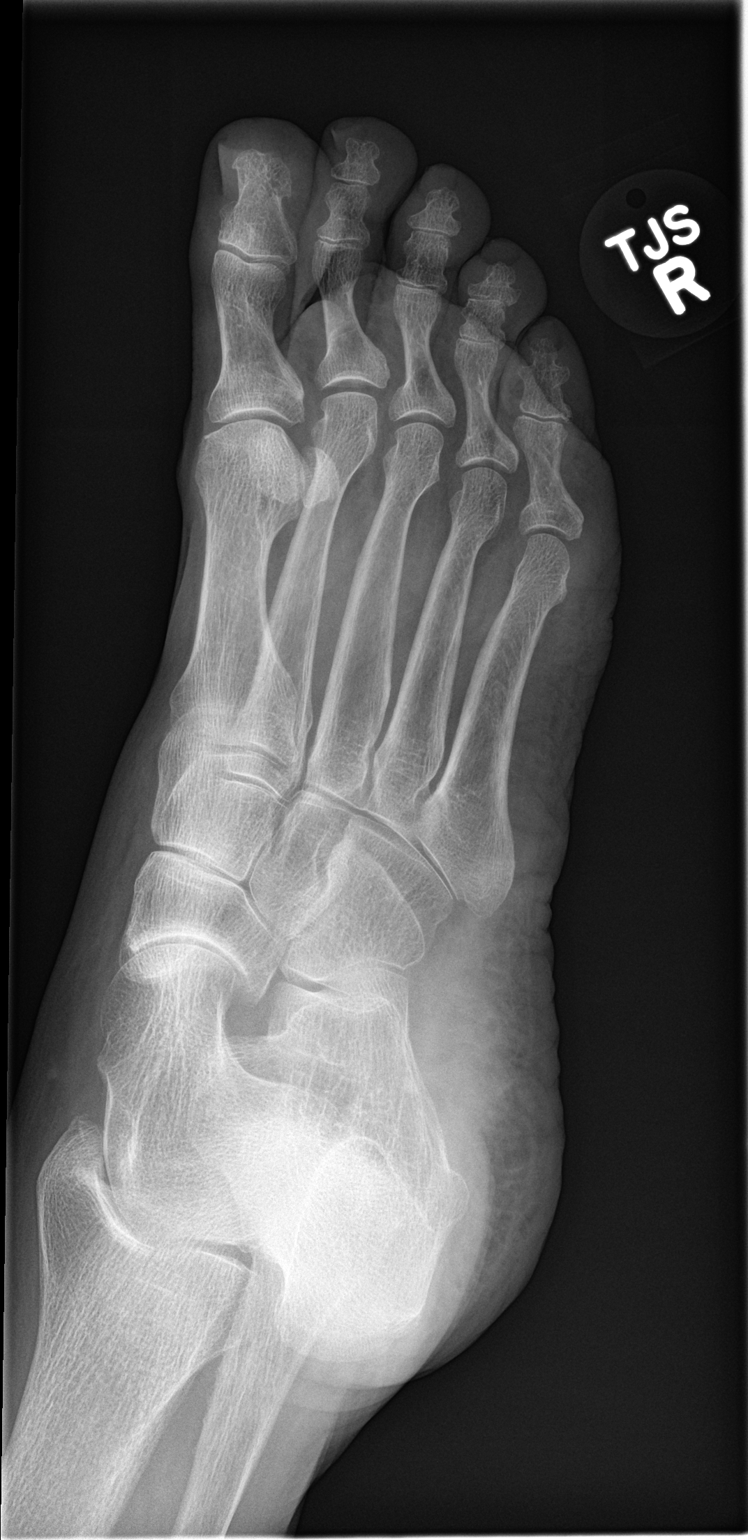
[im 3/3]
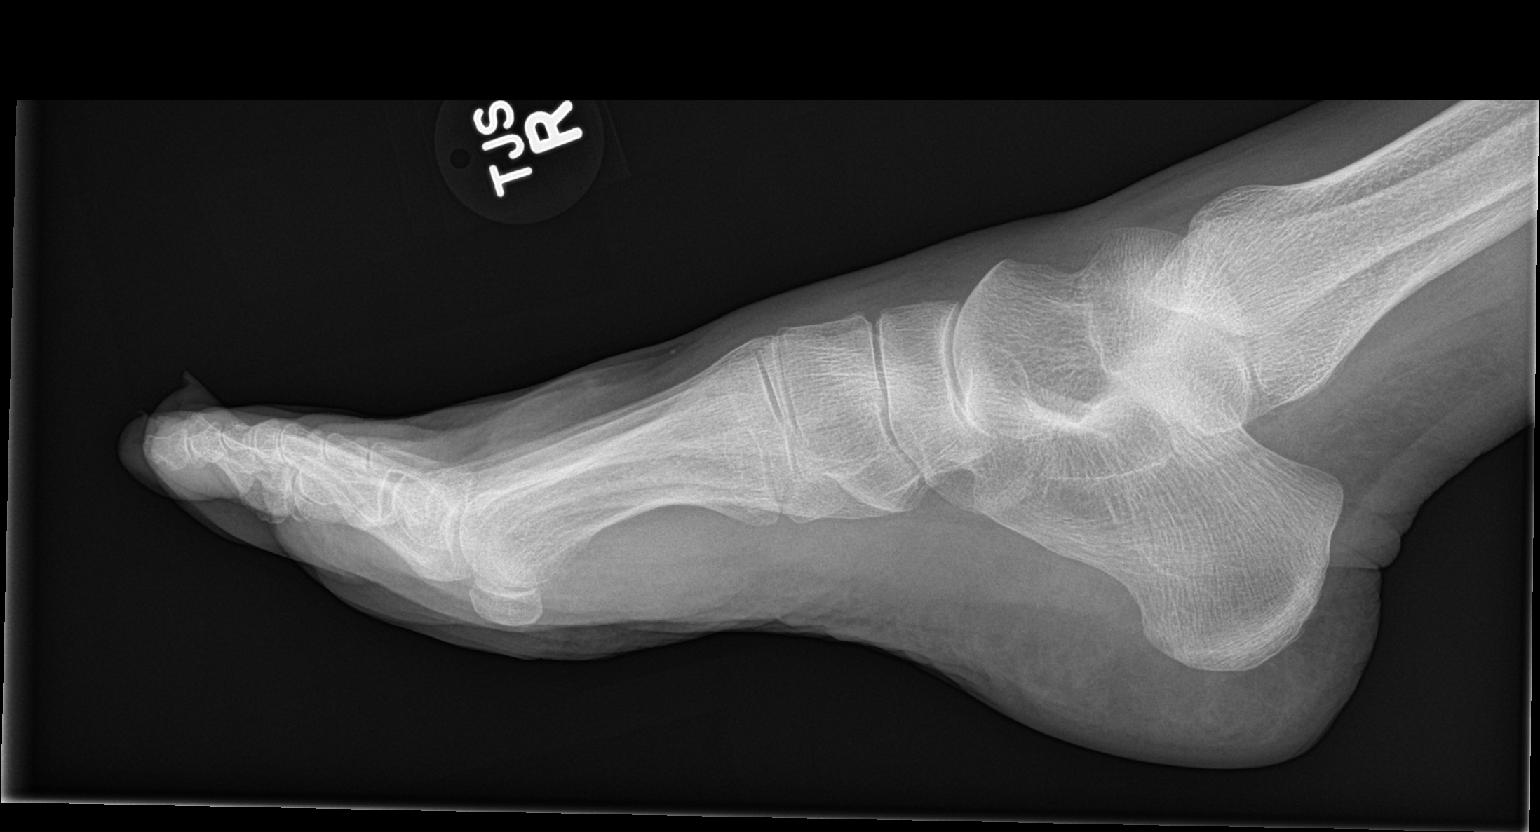

[3 of 3 positions shown; findings below may reference images not displayed]

FINDINGS: Diffuse osteopenia and degenerative change. Degenerative changes
most prominent about first metatarsophalangeal joint. No acute bony
abnormality identified.
IMPRESSION: Diffuse osteopenia degenerative change. Degenerative changes most
prominent about first metatarsophalangeal joint . No acute
abnormality.

## 2017-12-15 ENCOUNTER — Ambulatory Visit (INDEPENDENT_AMBULATORY_CARE_PROVIDER_SITE_OTHER): Payer: Medicare Other

## 2017-12-15 ENCOUNTER — Ambulatory Visit (INDEPENDENT_AMBULATORY_CARE_PROVIDER_SITE_OTHER): Payer: Medicare Other | Admitting: Family Medicine

## 2017-12-15 VITALS — BP 132/72 | HR 63 | Temp 98.8°F | Ht 63.0 in | Wt 151.0 lb

## 2017-12-15 DIAGNOSIS — Z23 Encounter for immunization: Secondary | ICD-10-CM

## 2017-12-15 DIAGNOSIS — Z0000 Encounter for general adult medical examination without abnormal findings: Secondary | ICD-10-CM

## 2017-12-15 DIAGNOSIS — Z780 Asymptomatic menopausal state: Secondary | ICD-10-CM

## 2017-12-15 DIAGNOSIS — M810 Age-related osteoporosis without current pathological fracture: Secondary | ICD-10-CM

## 2017-12-15 DIAGNOSIS — D649 Anemia, unspecified: Secondary | ICD-10-CM

## 2017-12-15 DIAGNOSIS — R7989 Other specified abnormal findings of blood chemistry: Secondary | ICD-10-CM | POA: Diagnosis not present

## 2017-12-15 DIAGNOSIS — R7303 Prediabetes: Secondary | ICD-10-CM

## 2017-12-15 DIAGNOSIS — E782 Mixed hyperlipidemia: Secondary | ICD-10-CM

## 2017-12-15 DIAGNOSIS — K7581 Nonalcoholic steatohepatitis (NASH): Secondary | ICD-10-CM | POA: Diagnosis not present

## 2017-12-15 DIAGNOSIS — Z8673 Personal history of transient ischemic attack (TIA), and cerebral infarction without residual deficits: Secondary | ICD-10-CM

## 2017-12-15 DIAGNOSIS — F5101 Primary insomnia: Secondary | ICD-10-CM

## 2017-12-15 DIAGNOSIS — I10 Essential (primary) hypertension: Secondary | ICD-10-CM | POA: Diagnosis not present

## 2017-12-15 MED ORDER — TRAZODONE HCL 50 MG PO TABS: 25.0000 mg | ORAL_TABLET | Freq: Every evening | ORAL | 3 refills | Status: DC | PRN

## 2017-12-15 NOTE — Patient Instructions (Signed)
 The CDC recommends two doses of Shingrix (the shingles vaccine) separated by 2 to 6 months for adults age 73 years and older. I recommend checking with your insurance plan regarding coverage for this vaccine.     Preventive Care 65 Years and Older, Female Preventive care refers to lifestyle choices and visits with your health care provider that can promote health and wellness. What does preventive care include?  A yearly physical exam. This is also called an annual well check.  Dental exams once or twice a year.  Routine eye exams. Ask your health care provider how often you should have your eyes checked.  Personal lifestyle choices, including: ? Daily care of your teeth and gums. ? Regular physical activity. ? Eating a healthy diet. ? Avoiding tobacco and drug use. ? Limiting alcohol use. ? Practicing safe sex. ? Taking low-dose aspirin every day. ? Taking vitamin and mineral supplements as recommended by your health care provider. What happens during an annual well check? The services and screenings done by your health care provider during your annual well check will depend on your age, overall health, lifestyle risk factors, and family history of disease. Counseling Your health care provider may ask you questions about your:  Alcohol use.  Tobacco use.  Drug use.  Emotional well-being.  Home and relationship well-being.  Sexual activity.  Eating habits.  History of falls.  Memory and ability to understand (cognition).  Work and work environment.  Reproductive health.  Screening You may have the following tests or measurements:  Height, weight, and BMI.  Blood pressure.  Lipid and cholesterol levels. These may be checked every 5 years, or more frequently if you are over 73 years old.  Skin check.  Lung cancer screening. You may have this screening every year starting at age 55 if you have a 30-pack-year history of smoking and currently smoke or have  quit within the past 15 years.  Fecal occult blood test (FOBT) of the stool. You may have this test every year starting at age 73.  Flexible sigmoidoscopy or colonoscopy. You may have a sigmoidoscopy every 5 years or a colonoscopy every 10 years starting at age 73.  Hepatitis C blood test.  Hepatitis B blood test.  Sexually transmitted disease (STD) testing.  Diabetes screening. This is done by checking your blood sugar (glucose) after you have not eaten for a while (fasting). You may have this done every 1-3 years.  Bone density scan. This is done to screen for osteoporosis. You may have this done starting at age 65.  Mammogram. This may be done every 1-2 years. Talk to your health care provider about how often you should have regular mammograms.  Talk with your health care provider about your test results, treatment options, and if necessary, the need for more tests. Vaccines Your health care provider may recommend certain vaccines, such as:  Influenza vaccine. This is recommended every year.  Tetanus, diphtheria, and acellular pertussis (Tdap, Td) vaccine. You may need a Td booster every 10 years.  Varicella vaccine. You may need this if you have not been vaccinated.  Zoster vaccine. You may need this after age 60.  Measles, mumps, and rubella (MMR) vaccine. You may need at least one dose of MMR if you were born in 1957 or later. You may also need a second dose.  Pneumococcal 13-valent conjugate (PCV13) vaccine. One dose is recommended after age 65.  Pneumococcal polysaccharide (PPSV23) vaccine. One dose is recommended after age 65.    Meningococcal vaccine. You may need this if you have certain conditions.  Hepatitis A vaccine. You may need this if you have certain conditions or if you travel or work in places where you may be exposed to hepatitis A.  Hepatitis B vaccine. You may need this if you have certain conditions or if you travel or work in places where you may be  exposed to hepatitis B.  Haemophilus influenzae type b (Hib) vaccine. You may need this if you have certain conditions.  Talk to your health care provider about which screenings and vaccines you need and how often you need them. This information is not intended to replace advice given to you by your health care provider. Make sure you discuss any questions you have with your health care provider. Document Released: 03/15/2015 Document Revised: 11/06/2015 Document Reviewed: 12/18/2014 Elsevier Interactive Patient Education  2018 Elsevier Inc.  

## 2017-12-15 NOTE — Progress Notes (Signed)
Patient: Jade Gardner, Female    DOB: 11-21-1945, 72 y.o.   MRN: [id number] Visit Date: 12/15/2017  Today's Provider: Lavon Paganini, MD   Chief Complaint  Patient presents with  . Annual Exam   Subjective:  I, Jade Gardner, CMA, am acting as a scribe for Lavon Paganini, MD.   Patient had a AWE with [facility] today.    Complete Physical Jade Gardner is a 72 y.o. female. She feels well. She reports exercising 4 days per week walking. She reports she is sleeping poorly. Patient reports taking 3 Advil at bedtime to help with sleep.  Dr Rockey Situ and patient agreed to stop Zetia due to LE edema about 6 months ago.  She is having difficulty falling asleep.  She has tried melatonin which did not help.  She is currently taking ibuprofen at bedtime which helps her fall asleep.  Her sleep is not interrupted by pain, however.  She is never taken any other medications for sleep -----------------------------------------------------------   Review of Systems  Constitutional: Negative.   HENT: Negative.   Eyes: Negative.   Respiratory: Positive for cough. Negative for apnea, choking, chest tightness, shortness of breath, wheezing and stridor.   Cardiovascular: Positive for leg swelling. Negative for chest pain and palpitations.  Gastrointestinal: Positive for nausea. Negative for abdominal distention, abdominal pain, anal bleeding, blood in stool, constipation, diarrhea, rectal pain and vomiting.  Endocrine: Negative.   Genitourinary: Negative.   Musculoskeletal: Positive for back pain. Negative for arthralgias, gait problem, joint swelling, myalgias, neck pain and neck stiffness.  Skin: Negative.   Allergic/Immunologic: Negative.   Neurological: Negative.   Hematological: Negative.   Psychiatric/Behavioral: Positive for sleep disturbance. Negative for agitation, behavioral problems, confusion, decreased concentration, dysphoric mood, hallucinations, self-injury and  suicidal ideas. The patient is not nervous/anxious and is not hyperactive.     Social History   Socioeconomic History  . Marital status: Widowed    Spouse name: Not on file  . Number of children: 2  . Years of education: HS Grad  . Highest education level: High school graduate  Occupational History  . Occupation: Retired    Comment: [employer]  Social Needs  . Financial resource strain: Not hard at all  . Food insecurity:    Worry: Never true    Inability: Never true  . Transportation needs:    Medical: No    Non-medical: No  Tobacco Use  . Smoking status: Never Smoker  . Smokeless tobacco: Never Used  Substance and Sexual Activity  . Alcohol use: No  . Drug use: No  . Sexual activity: Not on file  Lifestyle  . Physical activity:    Days per week: Not on file    Minutes per session: Not on file  . Stress: Only a little  Relationships  . Social connections:    Talks on phone: Patient refused    Gets together: Patient refused    Attends religious service: Patient refused    Active member of club or organization: Patient refused    Attends meetings of clubs or organizations: Patient refused    Relationship status: Patient refused  . Intimate partner violence:    Fear of current or ex partner: Patient refused    Emotionally abused: Patient refused    Physically abused: Patient refused    Forced sexual activity: Patient refused  Other Topics Concern  . Not on file  Social History Narrative  . Not on file  Past Medical History:  Diagnosis Date  . Diverticulosis   . GERD (gastroesophageal reflux disease)   . Hypercalcemia    Secondar to calcium supplements. Normal PTH  . Hypercholesterolemia   . Hypertension    controlled on meds  . Pneumonia    in past  . SVT (supraventricular tachycardia) (HCC)    history of  . TIA (transient ischemic attack)    x2  . TIA (transient ischemic attack)    x2  . UTI (urinary tract infection)   . Vertigo       Patient Active Problem List   Diagnosis Date Noted  . Elevated TSH 05/18/2017  . Leg cramps 05/18/2017  . Hyperlipidemia 11/28/2015  . H/O transient cerebral ischemia 05/29/2015  . Personal history of other diseases of the musculoskeletal system and connective tissue 05/29/2015  . Bronchitis, mucopurulent recurrent ([place]) 05/29/2015  . Diverticulosis 08/24/2014  . Fibrocystic breast disease 08/24/2014  . Anemia 08/23/2014  . Allergic rhinitis 08/23/2014  . DDD (degenerative disc disease), thoracolumbar 08/23/2014  . Hearing loss 08/23/2014  . Insomnia 08/23/2014  . Osteoporosis 08/23/2014  . Overweight (BMI 25.0-29.9) 08/23/2014  . Pre-diabetes 08/23/2014  . Obstructive sleep apnea 08/23/2014  . Varicose veins of both lower extremities without ulcer or inflammation 08/23/2014  . B12 deficiency 08/23/2014  . Vitamin D deficiency 08/23/2014  . Carotid stenosis 09/15/2013  . SVT (supraventricular tachycardia) ([facility]) 05/26/2011  . Nonalcoholic steatohepatitis (NASH) 05/20/2009  . Diaphragmatic hernia 08/10/2008  . Cardiac conduction disorder 08/10/2008  . Benign essential HTN 08/10/2008  . GERD (gastroesophageal reflux disease) 08/10/2008    Past Surgical History:  Procedure Laterality Date  . APPENDECTOMY  1954  . BREAST CYST ASPIRATION Right    neg  . CARDIAC CATHETERIZATION  [id number]  . CATARACT EXTRACTION W/PHACO Right 05/26/2017   Procedure: CATARACT EXTRACTION PHACO AND INTRAOCULAR LENS PLACEMENT ([place]) RIGHT;  Surgeon: Leandrew Koyanagi, MD;  Location: [facility];  Service: Ophthalmology;  Laterality: Right;  . CATARACT EXTRACTION W/PHACO Left 08/18/2017   Procedure: CATARACT EXTRACTION PHACO AND INTRAOCULAR LENS PLACEMENT ([facility]) LEFT;  Surgeon: Leandrew Koyanagi, MD;  Location: [facility];  Service: Ophthalmology;  Laterality: Left;  . CHOLECYSTECTOMY  1981    Her family history includes Breast cancer (age of onset: 23) in her maternal aunt;  Dementia in her father; Heart disease in her brother, father, and mother; Stroke in her unknown relative.      Current Outpatient Medications:  .  Ascorbic Acid (VITAMIN C) 1000 MG tablet, Take 1,000 mg by mouth daily. am, Disp: , Rfl:  .  aspirin 81 MG tablet, Take 81 mg by mouth daily. pm, Disp: , Rfl:  .  cholecalciferol (VITAMIN D) 1000 units tablet, Take 1 tablet (1,000 Units total) by mouth daily. (Patient not taking: Reported on 08/16/2017), Disp: , Rfl:  .  clopidogrel (PLAVIX) 75 MG tablet, TAKE 1 TABLET BY MOUTH  EVERY DAY WITH BREAKFAST, Disp: 90 tablet, Rfl: 3 .  Cranberry 500 MG CAPS, Take by mouth. am, Disp: , Rfl:  .  cyclobenzaprine (FLEXERIL) 10 MG tablet, Take 0.5-1 tablets (5-10 mg total) by mouth at bedtime as needed for muscle spasms., Disp: 90 tablet, Rfl: 1 .  ezetimibe (ZETIA) 10 MG tablet, Take 1 tablet (10 mg total) by mouth daily. (Patient not taking: Reported on 12/15/2017), Disp: 90 tablet, Rfl: 3 .  fluticasone (FLONASE) 50 MCG/ACT nasal spray, Place 2 sprays into both nostrils daily. (Patient taking differently: Place 2 sprays into both nostrils as  needed. pm), Disp: 16 g, Rfl: 2 .  hydrochlorothiazide (HYDRODIURIL) 25 MG tablet, TAKE 1/2 TABLET(12.5 MG) BY MOUTH DAILY (Patient not taking: Reported on 08/17/2017), Disp: 45 tablet, Rfl: 2 .  ibandronate (BONIVA) 150 MG tablet, TAKE 1 TABLET BY MOUTH ONCE MONTHLY WITH FULL GLASS WATER ON AN EMPTY STOMACH, NO FOOD, DRINK OR MEDICATION, Disp: 1 tablet, Rfl: 11 .  ibuprofen (ADVIL,MOTRIN) 200 MG tablet, Take 200 mg by mouth every 6 (six) hours as needed., Disp: , Rfl:  .  lovastatin (MEVACOR) 20 MG tablet, TAKE 1 TABLET BY MOUTH AT  BEDTIME, Disp: 90 tablet, Rfl: 3 .  meclizine (ANTIVERT) 25 MG tablet, Take 1 tablet (25 mg total) by mouth 3 (three) times daily as needed for dizziness., Disp: 60 tablet, Rfl: 0 .  metoprolol succinate (TOPROL-XL) 50 MG 24 hr tablet, Take 1 tablet (50 mg total) by mouth daily., Disp: 90 tablet,  Rfl: 3 .  montelukast (SINGULAIR) 10 MG tablet, TAKE 1 TABLET BY MOUTH AT  BEDTIME, Disp: 90 tablet, Rfl: 3 .  ondansetron (ZOFRAN ODT) 4 MG disintegrating tablet, Take 1 tablet (4 mg total) by mouth every 6 (six) hours as needed for nausea or vomiting. (Patient not taking: Reported on 08/17/2017), Disp: 20 tablet, Rfl: 0 .  pantoprazole (PROTONIX) 40 MG tablet, TAKE 1 TABLET BY MOUTH  DAILY, Disp: 90 tablet, Rfl: 3 .  traZODone (DESYREL) 50 MG tablet, Take 0.5-1 tablets (25-50 mg total) by mouth at bedtime as needed for sleep., Disp: 30 tablet, Rfl: 3  Patient Care Team: [name], MD as PCP - General (Family Medicine) Minna Merritts, MD as Consulting Physician (Cardiology) Leandrew Koyanagi, MD as Referring Physician (Ophthalmology)     Objective:   Vitals: BP 132/72 (BP Location: Right Arm, Patient Position: Sitting, Cuff Size: Normal)   Pulse 63   Temp 98.8 F (37.1 C) (Oral)   Ht 5' 3"  (1.6 m)   Wt 151 lb (68.5 kg)   BMI 26.75 kg/m   Physical Exam  Constitutional: She is oriented to person, place, and time. She appears well-developed and well-nourished. No distress.  HENT:  Head: Normocephalic and atraumatic.  Right Ear: External ear normal.  Left Ear: External ear normal.  Nose: Nose normal.  Mouth/Throat: Oropharynx is clear and moist.  Eyes: Pupils are equal, round, and reactive to light. Conjunctivae and EOM are normal. No scleral icterus.  Neck: Neck supple. No thyromegaly present.  Cardiovascular: Normal rate, regular rhythm, normal heart sounds and intact distal pulses.  No murmur heard. Pulmonary/Chest: Effort normal and breath sounds normal. No respiratory distress. She has no wheezes. She has no rales.  Abdominal: Soft. Bowel sounds are normal. She exhibits no distension. There is no tenderness. There is no rebound and no guarding.  Musculoskeletal: She exhibits no edema or deformity.  Lymphadenopathy:    She has no cervical adenopathy.    Neurological: She is alert and oriented to person, place, and time.  Skin: Skin is warm and dry. Capillary refill takes less than 2 seconds. No rash noted.  Psychiatric: She has a normal mood and affect. Her behavior is normal.  Vitals reviewed.   Activities of Daily Living In your present state of health, do you have any difficulty performing the following activities: 12/15/2017 08/18/2017  Hearing? Y N  Comment Does not wear hearing aids.  -  Vision? N N  Difficulty concentrating or making decisions? N N  Walking or climbing stairs? N N  Dressing or bathing? N  N  Doing errands, shopping? N -  Preparing Food and eating ? N -  Using the Toilet? N -  In the past six months, have you accidently leaked urine? N -  Do you have problems with loss of bowel control? N -  Managing your Medications? N -  Managing your Finances? N -  Housekeeping or managing your Housekeeping? N -  Some recent data might be hidden    Fall Risk Assessment Fall Risk  12/15/2017 09/17/2016 09/05/2015 08/23/2014  Falls in the past year? No No No No     Depression Screen PHQ 2/9 Scores 12/15/2017 09/17/2016 09/05/2015 08/23/2014  PHQ - 2 Score 2 2 2  0  PHQ- 9 Score 8 6 5  -     Assessment & Plan:    Annual Physical Reviewed patient's Family Medical History Reviewed and updated list of patient's medical providers Assessment of cognitive impairment was done Assessed patient's functional ability Established a written schedule for health screening [place] Completed and Reviewed  Exercise Activities and Dietary recommendations Goals    . DIET - INCREASE WATER INTAKE     Recommend to drink at least 6-8 8oz glasses of water per day.     . Exercise 3x per week (30 min per time)     Recommend to start exercising 3 times a week for 30 minutes. Pt to start in the fall.        Immunization History  Administered Date(s) Administered  . Hep A / Hep B 12/17/2008, 01/14/2009, 06/14/2009   . Hepatitis A 12/17/2008  . Hepatitis B 12/17/2008, 01/14/2009, 06/14/2009, 06/14/2009  . Influenza Split 01/07/2009, 12/12/2009, 12/24/2011  . Influenza, High Dose Seasonal PF 12/15/2017  . Influenza,inj,Quad PF,6+ Mos 12/30/2012, 11/08/2013, 03/06/2015  . Influenza-Unspecified 12/31/2012, 11/08/2013, 11/29/2015  . Pneumococcal Conjugate-13 01/18/2014  . Pneumococcal Polysaccharide-23 08/12/2011    Health Maintenance  Topic Date Due  . TETANUS/TDAP  12/16/2022 (Originally 03/08/1964)  . COLONOSCOPY  07/04/2019  . MAMMOGRAM  08/17/2019  . INFLUENZA VACCINE  Completed  . DEXA SCAN  Completed  . Hepatitis C Screening  Completed  . PNA vac Low Risk Adult  Completed     Discussed health benefits of physical activity, and encouraged her to engage in regular exercise appropriate for her age and condition.    ------------------------------------------------------------------------------------------------------------  Problem List Items Addressed This Visit      Cardiovascular and Mediastinum   Benign essential HTN   Relevant Orders   Comprehensive metabolic panel     Digestive   Nonalcoholic steatohepatitis (NASH)   Relevant Orders   Comprehensive metabolic panel     Musculoskeletal and Integument   Osteoporosis   Relevant Orders   DG Bone Density     Other   Anemia   Relevant Orders   CBC w/Diff/Platelet   Insomnia    Will try Trazodone at low dose for sleep      Pre-diabetes   Relevant Orders   Hemoglobin A1c   H/O transient cerebral ischemia   Hyperlipidemia   Relevant Orders   Lipid panel   Comprehensive metabolic panel   Elevated TSH   Relevant Orders   TSH    Other Visit Diagnoses    Encounter for annual physical exam    -  Primary   Postmenopausal       Relevant Orders   DG Bone Density       Return in about 6 months (around 06/16/2018) for chronic disease f/u.   The entirety  of the information documented in the History of Present Illness,  Review of Systems and Physical Exam were personally obtained by me. Portions of this information were initially documented by Jade Gardner, CMA and reviewed by me for thoroughness and accuracy.    [name], MD, MPH [facility] 12/15/2017 11:38 AM

## 2017-12-15 NOTE — Progress Notes (Signed)
Subjective:   Jade Gardner is a 72 y.o. female who presents for Medicare Annual (Subsequent) preventive examination.  Review of Systems:  N/A  Cardiac Risk Factors include: advanced age (>81mn, >>59women);hypertension;dyslipidemia     Objective:     Vitals: BP 132/72 (BP Location: Left Arm)   Pulse 63   Temp 98.8 F (37.1 C) (Oral)   Ht 5' 3"  (1.6 m)   Wt 151 lb (68.5 kg)   BMI 26.75 kg/m   Body mass index is 26.75 kg/m.  Advanced Directives 12/15/2017 08/18/2017 05/26/2017 09/17/2016 08/16/2016 11/22/2015 09/05/2015  Does Patient Have a Medical Advance Directive? Yes Yes Yes Yes Yes No Yes  Type of AParamedicof APioneerLiving will [place]ake GoodwinLiving will Healthcare Power of AGilbertLiving will [place]Living will - -  Copy of H[place]ard[place] Chart? No - copy requested No - copy requested Yes No - copy requested - - -  Would patient like information on creating a medical advance directive? - - - - - No - patient declined information -    Tobacco Social History   Tobacco Use  Smoking Status Never Smoker  Smokeless Tobacco Never Used     Counseling given: Not Answered   Clinical Intake:  Pre-visit preparation completed: Yes  Pain : No/denies pain Pain Score: 0-No pain     Nutritional Status: BMI 25 -29 Overweight Nutritional Risks: Nausea/ vomitting/ diarrhea(Nausea intermittenly- possibly due to medication. ) Diabetes: No  How often do you need to have someone help you when you read instructions, pamphlets, or other written materials from your doctor or pharmacy?: 1 - Never  Interpreter Needed?: No  Information [facility] by :: [facility]  Past Medical History:  Diagnosis Date  . Diverticulosis   . GERD (gastroesophageal reflux disease)   . Hypercalcemia    Secondar to calcium supplements. Normal PTH  . Hypercholesterolemia   . Hypertension     controlled on meds  . Pneumonia    in past  . SVT (supraventricular tachycardia) (HCC)    history of  . TIA (transient ischemic attack)    x2  . TIA (transient ischemic attack)    x2  . UTI (urinary tract infection)   . Vertigo    Past Surgical History:  Procedure Laterality Date  . APPENDECTOMY  1954  . BREAST CYST ASPIRATION Right    neg  . CARDIAC CATHETERIZATION  [id number] . CATARACT EXTRACTION W/PHACO Right 05/26/2017   Procedure: CATARACT EXTRACTION PHACO AND INTRAOCULAR LENS PLACEMENT ([place] RIGHT;  Surgeon: BLeandrew Koyanagi MD;  [facility]  Service: Ophthalmology;  Laterality: Right;  . CATARACT EXTRACTION W/PHACO Left 08/18/2017   Procedure: CATARACT EXTRACTION PHACO AND INTRAOCULAR LENS PLACEMENT ([place] LEFT;  Surgeon: BLeandrew Koyanagi MD;  [facility]  Service: Ophthalmology;  Laterality: Left;  . CHOLECYSTECTOMY  1981   Family History  Problem Relation Age of Onset  . Heart disease Father   . Dementia Father   . Heart disease Mother   . Heart disease Brother   . Stroke Unknown   . Breast cancer Maternal Aunt [phone]  Social History   Socioeconomic History  . Marital status: Widowed    Spouse name: Not on file  . Number of children: 2  . Years of education: HS Grad  . Highest education level: High school graduate  Occupational History  . Occupation: Retired    Comment: [employer]  Social Needs  . Financial resource strain: Not hard at all  . Food insecurity:    Worry: Never true    Inability: Never true  . Transportation needs:    Medical: No    Non-medical: No  Tobacco Use  . Smoking status: Never Smoker  . Smokeless tobacco: Never Used  Substance and Sexual Activity  . Alcohol use: No  . Drug use: No  . Sexual activity: Not on file  Lifestyle  . Physical activity:    Days per week: Not on file    Minutes per session: Not on file  . Stress: Only a little  Relationships  . Social  connections:    Talks on phone: Patient refused    Gets together: Patient refused    Attends religious service: Patient refused    Active member of club or organization: Patient refused    Attends meetings of clubs or organizations: Patient refused    Relationship status: Patient refused  Other Topics Concern  . Not on file  Social History Narrative  . Not on file    Outpatient Encounter Medications as of 12/15/2017  Medication Sig  . aspirin 81 MG tablet Take 81 mg by mouth daily. pm  . clopidogrel (PLAVIX) 75 MG tablet TAKE 1 TABLET BY MOUTH  EVERY DAY WITH BREAKFAST  . Cranberry 500 MG CAPS Take by mouth. am  . cyclobenzaprine (FLEXERIL) 10 MG tablet Take 0.5-1 tablets (5-10 mg total) by mouth at bedtime as needed for muscle spasms.  . fluticasone (FLONASE) 50 MCG/ACT nasal spray Place 2 sprays into both nostrils daily. (Patient taking differently: Place 2 sprays into both nostrils as needed. pm)  . ibandronate (BONIVA) 150 MG tablet TAKE 1 TABLET BY MOUTH ONCE MONTHLY WITH FULL GLASS WATER ON AN EMPTY STOMACH, NO FOOD, DRINK OR MEDICATION  . ibuprofen (ADVIL,MOTRIN) 200 MG tablet Take 200 mg by mouth every 6 (six) hours as needed.  . lovastatin (MEVACOR) 20 MG tablet TAKE 1 TABLET BY MOUTH AT  BEDTIME  . meclizine (ANTIVERT) 25 MG tablet Take 1 tablet (25 mg total) by mouth 3 (three) times daily as needed for dizziness.  . metoprolol succinate (TOPROL-XL) 50 MG 24 hr tablet Take 1 tablet (50 mg total) by mouth daily.  . montelukast (SINGULAIR) 10 MG tablet TAKE 1 TABLET BY MOUTH AT  BEDTIME  . pantoprazole (PROTONIX) 40 MG tablet TAKE 1 TABLET BY MOUTH  DAILY  . Ascorbic Acid (VITAMIN C) 1000 MG tablet Take 1,000 mg by mouth daily. am  . cholecalciferol (VITAMIN D) 1000 units tablet Take 1 tablet (1,000 Units total) by mouth daily. (Patient not taking: Reported on 08/16/2017)  . ezetimibe (ZETIA) 10 MG tablet Take 1 tablet (10 mg total) by mouth daily. (Patient not taking: Reported on  12/15/2017)  . hydrochlorothiazide (HYDRODIURIL) 25 MG tablet TAKE 1/2 TABLET(12.5 MG) BY MOUTH DAILY (Patient not taking: Reported on 08/17/2017)  . ondansetron (ZOFRAN ODT) 4 MG disintegrating tablet Take 1 tablet (4 mg total) by mouth every 6 (six) hours as needed for nausea or vomiting. (Patient not taking: Reported on 08/17/2017)   No facility-administered encounter medications on file as of 12/15/2017.     Activities of Daily Living In your present state of health, do you have any difficulty performing the following activities: 12/15/2017 08/18/2017  Hearing? Y N  Comment Does not wear hearing aids.  -  Vision? N N  Difficulty concentrating or making decisions? N N  Walking or climbing stairs?  N N  Dressing or bathing? N N  Doing errands, shopping? N -  Preparing Food and eating ? N -  Using the Toilet? N -  In the past six months, have you accidently leaked urine? N -  Do you have problems with loss of bowel control? N -  Managing your Medications? N -  Managing your Finances? N -  Housekeeping or managing your Housekeeping? N -  Some recent data might be hidden    Patient Care Team: Bacigalupo, Dionne Bucy, MD as PCP - General (Family Medicine) Minna Merritts, MD as Consulting Physician (Cardiology) Leandrew Koyanagi, MD as Referring Physician (Ophthalmology)    Assessment:   This is a routine wellness examination for Nekeshia.  Exercise Activities and Dietary recommendations Current Exercise Habits: Structured exercise class, Type of exercise: walking, Time (Minutes): 45, Frequency (Times/Week): 4, Weekly Exercise (Minutes/Week): 180, Intensity: Mild, Exercise limited by: None identified  Goals    . DIET - INCREASE WATER INTAKE     Recommend to drink at least 6-8 8oz glasses of water per day.     . Exercise 3x per week (30 min per time)     Recommend to start exercising 3 times a week for 30 minutes. Pt to start in the fall.        Fall Risk Fall Risk   12/15/2017 09/17/2016 09/05/2015 08/23/2014  Falls in the past year? No No No No   FALL RISK PREVENTION PERTAINING TO THE HOME:  Any stairs in or around the home WITH handrails? No  Home free of loose throw rugs in walkways, pet beds, electrical cords, etc? Yes  Adequate lighting in your home to reduce risk of falls? Yes   ASSISTIVE DEVICES UTILIZED TO PREVENT FALLS:  Life alert? No  Use of a cane, walker or w/c? No  Grab bars in the bathroom? No  Shower chair or bench in shower? No  Elevated toilet seat or a handicapped toilet? No   DME ORDERS:  DME order needed?  No   TIMED UP AND GO:  Was the test performed? No .    Depression Screen PHQ 2/9 Scores 12/15/2017 09/17/2016 09/05/2015 08/23/2014  PHQ - 2 Score 2 2 2  0  PHQ- 9 Score 8 6 5  -     Cognitive Function: Declined today.      6CIT Screen 09/17/2016  What Year? 0 points  What month? 0 points  What time? 0 points  Count back from 20 0 points  Months in reverse 0 points  Repeat phrase 0 points  Total Score 0    Immunization History  Administered Date(s) Administered  . Hep A / Hep B 12/17/2008, 01/14/2009, 06/14/2009  . Hepatitis A 12/17/2008  . Hepatitis B 12/17/2008, 01/14/2009, 06/14/2009, 06/14/2009  . Influenza Split 01/07/2009, 12/12/2009, 12/24/2011  . Influenza, High Dose Seasonal PF 12/15/2017  . Influenza,inj,Quad PF,6+ Mos 12/30/2012, 11/08/2013, 03/06/2015  . Influenza-Unspecified 12/31/2012, 11/08/2013, 11/29/2015  . Pneumococcal Conjugate-13 01/18/2014  . Pneumococcal Polysaccharide-23 08/12/2011    Qualifies for Shingles Vaccine? Yes . Due for Shingrix. Education has been provided regarding the importance of this vaccine. Pt has been advised to call insurance company to determine out of pocket expense. Advised may also receive vaccine at local pharmacy or Health Dept. Verbalized acceptance and understanding.  Tdap: Although this vaccine is not a covered service during a Wellness Exam, does the  patient still wish to receive this vaccine today?  No .  Education has been provided regarding the  importance of this vaccine. Advised may receive this vaccine at local pharmacy or Health Dept. Aware to provide a copy of the vaccination record if obtained from local pharmacy or Health Dept. Verbalized acceptance and understanding.  Flu Vaccine: Due for Flu vaccine. Does the patient want to receive this vaccine today?  Yes .   Pneumococcal Vaccine: Up to date  Screening Tests Health Maintenance  Topic Date Due  . TETANUS/TDAP  03/08/1963  . COLONOSCOPY  07/04/2019  . MAMMOGRAM  08/17/2019  . INFLUENZA VACCINE  Completed  . DEXA SCAN  Completed  . Hepatitis C Screening  Completed  . PNA vac Low Risk Adult  Completed   Cancer Screenings:  Colorectal Screening: Completed 07/03/09. Repeat every 5 years.  Mammogram: Completed 08/16/17. Repeat every year.  Bone Density: Completed 08/29/14.   Lung Cancer Screening: (Low Dose CT Chest recommended if Age 74-80 years, 30 pack-year currently smoking OR have quit w/in 15years.) does not qualify.   Additional Screening:  Hepatitis C Screening: Up to date  Vision Screening: Recommended annual ophthalmology exams for early detection of glaucoma and other disorders of the eye.  Dental Screening: Recommended annual dental exams for proper oral hygiene  Community Resource Referral:  CRR required this visit?  No       Plan:  I have personally reviewed and addressed the Medicare Annual Wellness questionnaire and have noted the following in the patient's chart:  A. Medical and social history B. Use of alcohol, tobacco or illicit drugs  C. Current medications and supplements D. Functional ability and status E.  Nutritional status F.  Physical activity G. Advance directives H. List of other physicians I.  Hospitalizations, surgeries, and ER visits in previous 12 months J.  [place] such as hearing and vision if needed,  cognitive and depression L. Referrals and appointments - none  In addition, I have reviewed and discussed with patient certain preventive protocols, quality metrics, and best practice recommendations. A written personalized care plan for preventive services as well as general preventive health recommendations were provided to patient.  See attached scanned questionnaire for additional information.   Signed,  Fabio Neighbors, LPN Nurse Health Advisor   Nurse Recommendations: Pt declined the tetanus vaccine today.

## 2017-12-15 NOTE — Assessment & Plan Note (Signed)
Will try Trazodone at low dose for sleep

## 2017-12-15 NOTE — Patient Instructions (Addendum)
Jade Gardner , Thank you for taking time to come for your Medicare Wellness Visit. I appreciate your ongoing commitment to your health goals. Please review the following plan we discussed and let me know if I can assist you in the future.   Screening recommendations/referrals: Colonoscopy: Up to date Mammogram: Up to date Bone Density: Up to date Recommended yearly ophthalmology/optometry visit for glaucoma screening and checkup Recommended yearly dental visit for hygiene and checkup  Vaccinations: Influenza vaccine: Up to date Pneumococcal vaccine: Up to date Tdap vaccine: Pt declines today.  Shingles vaccine: Pt declines today.     Advanced directives: Please bring a copy of your POA (Power of Attorney) and/or Living Will to your next appointment.   Conditions/risks identified: Recommend to drink at least 6-8 8oz glasses of water per day.  Next appointment: 10:00 AM today with Dr Brita Romp.   Preventive Care 73 Years and Older, Female Preventive care refers to lifestyle choices and visits with your health care provider that can promote health and wellness. What does preventive care include?  A yearly physical exam. This is also called an annual well check.  Dental exams once or twice a year.  Routine eye exams. Ask your health care provider how often you should have your eyes checked.  Personal lifestyle choices, including:  Daily care of your teeth and gums.  Regular physical activity.  Eating a healthy diet.  Avoiding tobacco and drug use.  Limiting alcohol use.  Practicing safe sex.  Taking low-dose aspirin every day.  Taking vitamin and mineral supplements as recommended by your health care provider. What happens during an annual well check? The services and screenings done by your health care provider during your annual well check will depend on your age, overall health, lifestyle risk factors, and family history of disease. Counseling  Your health care  provider may ask you questions about your:  Alcohol use.  Tobacco use.  Drug use.  Emotional well-being.  Home and relationship well-being.  Sexual activity.  Eating habits.  History of falls.  Memory and ability to understand (cognition).  Work and work Statistician.  Reproductive health. Screening  You may have the following tests or measurements:  Height, weight, and BMI.  Blood pressure.  Lipid and cholesterol levels. These may be checked every 5 years, or more frequently if you are over 66 years old.  Skin check.  Lung cancer screening. You may have this screening every year starting at age 4 if you have a 30-pack-year history of smoking and currently smoke or have quit within the past 15 years.  Fecal occult blood test (FOBT) of the stool. You may have this test every year starting at age 73.  Flexible sigmoidoscopy or colonoscopy. You may have a sigmoidoscopy every 5 years or a colonoscopy every 10 years starting at age 73.  Hepatitis C blood test.  Hepatitis B blood test.  Sexually transmitted disease (STD) testing.  Diabetes screening. This is done by checking your blood sugar (glucose) after you have not eaten for a while (fasting). You may have this done every 1-3 years.  Bone density scan. This is done to screen for osteoporosis. You may have this done starting at age 73.  Mammogram. This may be done every 1-2 years. Talk to your health care provider about how often you should have regular mammograms. Talk with your health care provider about your test results, treatment options, and if necessary, the need for more tests. Vaccines  Your health care provider  may recommend certain vaccines, such as:  Influenza vaccine. This is recommended every year.  Tetanus, diphtheria, and acellular pertussis (Tdap, Td) vaccine. You may need a Td booster every 10 years.  Zoster vaccine. You may need this after age 65.  Pneumococcal 13-valent conjugate (PCV13)  vaccine. One dose is recommended after age 73.  Pneumococcal polysaccharide (PPSV23) vaccine. One dose is recommended after age 73. Talk to your health care provider about which screenings and vaccines you need and how often you need them. This information is not intended to replace advice given to you by your health care provider. Make sure you discuss any questions you have with your health care provider. Document Released: 03/15/2015 Document Revised: 11/06/2015 Document Reviewed: 12/18/2014 Elsevier Interactive Patient Education  2017 [place] Prevention in the Home Falls can cause injuries. They can happen to people of all ages. There are many things you can do to make your home safe and to help prevent falls. What can I do on the outside of my home?  Regularly fix the edges of walkways and driveways and fix any cracks.  Remove anything that might make you trip as you walk through a door, such as a raised step or threshold.  Trim any bushes or trees on the path to your home.  Use bright outdoor lighting.  Clear any walking paths of anything that might make someone trip, such as rocks or tools.  Regularly check to see if handrails are loose or broken. Make sure that both sides of any steps have handrails.  Any raised decks and porches should have guardrails on the edges.  Have any leaves, snow, or ice cleared regularly.  Use sand or salt on walking paths during winter.  Clean up any spills in your garage right away. This includes oil or grease spills. What can I do in the bathroom?  Use night lights.  Install grab bars by the toilet and in the tub and shower. Do not use towel bars as grab bars.  Use non-skid mats or decals in the tub or shower.  If you need to sit down in the shower, use a plastic, non-slip stool.  Keep the floor dry. Clean up any water that spills on the floor as soon as it happens.  Remove soap buildup in the tub or shower  regularly.  Attach bath mats securely with double-sided non-slip rug tape.  Do not have throw rugs and other things on the floor that can make you trip. What can I do in the bedroom?  Use night lights.  Make sure that you have a light by your bed that is easy to reach.  Do not use any sheets or blankets that are too big for your bed. They should not hang down onto the floor.  Have a firm chair that has side arms. You can use this for support while you get dressed.  Do not have throw rugs and other things on the floor that can make you trip. What can I do in the kitchen?  Clean up any spills right away.  Avoid walking on wet floors.  Keep items that you use a lot in easy-to-reach places.  If you need to reach something above you, use a strong step stool that has a grab bar.  Keep electrical cords out of the way.  Do not use floor polish or wax that makes floors slippery. If you must use wax, use non-skid floor wax.  Do not have throw rugs  and other things on the floor that can make you trip. What can I do with my stairs?  Do not leave any items on the stairs.  Make sure that there are handrails on both sides of the stairs and use them. Fix handrails that are broken or loose. Make sure that handrails are as long as the stairways.  Check any carpeting to make sure that it is firmly attached to the stairs. Fix any carpet that is loose or worn.  Avoid having throw rugs at the top or bottom of the stairs. If you do have throw rugs, attach them to the floor with carpet tape.  Make sure that you have a light switch at the top of the stairs and the bottom of the stairs. If you do not have them, ask someone to add them for you. What else can I do to help prevent falls?  Wear shoes that:  Do not have high heels.  Have rubber bottoms.  Are comfortable and fit you well.  Are closed at the toe. Do not wear sandals.  If you use a stepladder:  Make sure that it is fully  opened. Do not climb a closed stepladder.  Make sure that both sides of the stepladder are locked into place.  Ask someone to hold it for you, if possible.  Clearly mark and make sure that you can see:  Any grab bars or handrails.  First and last steps.  Where the edge of each step is.  Use tools that help you move around (mobility aids) if they are needed. These include:  Canes.  Walkers.  Scooters.  Crutches.  Turn on the lights when you go into a dark area. Replace any light bulbs as soon as they burn out.  Set up your furniture so you have a clear path. Avoid moving your furniture around.  If any of your floors are uneven, fix them.  If there are any pets around you, be aware of where they are.  Review your medicines with your doctor. Some medicines can make you feel dizzy. This can increase your chance of falling. Ask your doctor what other things that you can do to help prevent falls. This information is not intended to replace advice given to you by your health care provider. Make sure you discuss any questions you have with your health care provider. Document Released: 12/13/2008 Document Revised: 07/25/2015 Document Reviewed: 03/23/2014 Elsevier Interactive Patient Education  2017 [employer].

## 2017-12-17 DIAGNOSIS — D649 Anemia, unspecified: Secondary | ICD-10-CM | POA: Diagnosis not present

## 2017-12-17 DIAGNOSIS — I10 Essential (primary) hypertension: Secondary | ICD-10-CM | POA: Diagnosis not present

## 2017-12-17 DIAGNOSIS — R7989 Other specified abnormal findings of blood chemistry: Secondary | ICD-10-CM | POA: Diagnosis not present

## 2017-12-17 DIAGNOSIS — K7581 Nonalcoholic steatohepatitis (NASH): Secondary | ICD-10-CM | POA: Diagnosis not present

## 2017-12-17 DIAGNOSIS — E782 Mixed hyperlipidemia: Secondary | ICD-10-CM | POA: Diagnosis not present

## 2017-12-17 DIAGNOSIS — H26493 Other secondary cataract, bilateral: Secondary | ICD-10-CM | POA: Diagnosis not present

## 2017-12-18 LAB — COMPREHENSIVE METABOLIC PANEL
A/G RATIO: 1.4 (ref 1.2–2.2)
ALT: 20 IU/L (ref 0–32)
AST: 34 IU/L (ref 0–40)
Albumin: 3.8 g/dL (ref 3.5–4.8)
Alkaline Phosphatase: 56 IU/L (ref 39–117)
BILIRUBIN TOTAL: 0.5 mg/dL (ref 0.0–1.2)
BUN/Creatinine Ratio: 17 (ref 12–28)
BUN: 14 mg/dL (ref 8–27)
CALCIUM: 9.8 mg/dL (ref 8.7–10.3)
CHLORIDE: 106 mmol/L (ref 96–106)
CO2: 21 mmol/L (ref 20–29)
Creatinine, Ser: 0.83 mg/dL (ref 0.57–1.00)
GFR, EST AFRICAN AMERICAN: 81 mL/min/{1.73_m2} (ref >59)
GFR, EST NON AFRICAN AMERICAN: 70 mL/min/{1.73_m2} (ref >59)
GLOBULIN, TOTAL: 2.7 g/dL (ref 1.5–4.5)
Glucose: 95 mg/dL (ref 65–99)
POTASSIUM: 4.1 mmol/L (ref 3.5–5.2)
SODIUM: 141 mmol/L (ref 134–144)
TOTAL PROTEIN: 6.5 g/dL (ref 6.0–8.5)

## 2017-12-18 LAB — CBC WITH DIFFERENTIAL/PLATELET
BASOS: 1 %
Basophils Absolute: 0.1 10*3/uL (ref 0.0–0.2)
EOS (ABSOLUTE): 0.4 10*3/uL (ref 0.0–0.4)
EOS: 8 %
HEMATOCRIT: 40.7 % (ref 34.0–46.6)
Hemoglobin: 13.2 g/dL (ref 11.1–15.9)
IMMATURE GRANULOCYTES: 0 %
Immature Grans (Abs): 0 10*3/uL (ref 0.0–0.1)
LYMPHS ABS: 1.6 10*3/uL (ref 0.7–3.1)
Lymphs: 33 %
MCH: 28.1 pg (ref 26.6–33.0)
MCHC: 32.4 g/dL (ref 31.5–35.7)
MCV: 87 fL (ref 79–97)
MONOS ABS: 0.5 10*3/uL (ref 0.1–0.9)
Monocytes: 9 %
NEUTROS ABS: 2.4 10*3/uL (ref 1.4–7.0)
Neutrophils: 49 %
Platelets: 183 10*3/uL (ref 150–450)
RBC: 4.7 x10E6/uL (ref 3.77–5.28)
RDW: 12.9 % (ref 12.3–15.4)
WBC: 4.9 10*3/uL (ref 3.4–10.8)

## 2017-12-18 LAB — LIPID PANEL
CHOL/HDL RATIO: 4.4 ratio (ref 0.0–4.4)
CHOLESTEROL TOTAL: 184 mg/dL (ref 100–199)
HDL: 42 mg/dL (ref >39)
LDL CALC: 102 mg/dL — AB (ref 0–99)
TRIGLYCERIDES: 198 mg/dL — AB (ref 0–149)
VLDL Cholesterol Cal: 40 mg/dL (ref 5–40)

## 2017-12-18 LAB — HEMOGLOBIN A1C
Est. average glucose Bld gHb Est-mCnc: 108 mg/dL
Hgb A1c MFr Bld: 5.4 % (ref 4.8–5.6)

## 2017-12-18 LAB — TSH: TSH: 2.43 u[IU]/mL (ref 0.450–4.500)

## 2017-12-26 NOTE — Progress Notes (Signed)
Cardiology Office Note  Date:  12/27/2017   ID:  JAZMARIE BIEVER, DOB [birth year], MRN [id number]  PCP:  Jade Crews, MD   Chief Complaint  Patient presents with  . other    OD 6 month f/u c/o edema right ankle. Meds reviewed verbally with pt.    HPI:  [name] is a very pleasant 72 year old woman with a history of  SVT,  TIA in 4128 of uncertain etiology,   H/A,  hypertension,  hyperlipidemia,  reaction to simvastatin/Crestor/Lipitor, [name],  cardiac catheterization in 1989-05-10 showing no significant coronary artery disease  who presents for routine followup of her SVT and high cholesterol, hypertension, PAD 40% bilateral carotid arterial disease  Previous Average cholesterol around 250 Now 184, LDL 102  BP well controlled at PMD visits  Exercising on a regular basis Tolerating Zetia and lovastatin  Having rare tachycardia 5 to 6 sec Couple times a month Does not bother her much  Problems with insomnia Taking 2 Advil every night  Bruising on aspirin Plavix  active, works part time  She reports having occasional dizziness, not very often  EKG personally reviewed by myself on todays visit Shows normal sinus rhythm with rate 58 bpm, APC  Other past medical history Previous TIA on 03/24/2013. She had acute onset of slurred speech, headache, dizziness, blurred vision, numbness on her for head. She went to the emergency room and blood pressure was 200/100. She stayed most of the day and was discharged home. TIA happened while on low-dose aspirin  CT scan of the head showed no stroke MRI of the head showed no acute stroke Lab work is essentially normal including basic metabolic panel and CBC  echocardiogram done that showed no ASD or PFO, otherwise normal study Carotid ultrasound showed bilateral 40-59% carotid arterial disease Holter monitor showed one short run of atrial tachycardia/SVT 7 beats, rare APCs  She had a difficult year 10-May-2012 as her  husband passed away last year from a reaction to blood transfusion. She has had some problems adjusting, insomnia, increasing her weight. She has a supportive family and is trying to stay active. For SVT, she has tried Cardizem in the past but had fatigue  Echocardiogram May 2006 showing mild LVH, mild MR, moderate TR, biventricular systolic pressure 46 mmHg with mild pulmonary hypertension  Perfusion stress test January 2006 showing normal ejection fraction, poor exercise tolerance on Bruce protocol for 3 minutes 30 seconds, or Apical septal ischemia  Significant family history with brother with bypass surgery, father with bypass surgery at age 67, mother with stroke and heart attack in her 58s   PMH:   has a past medical history of Diverticulosis, GERD (gastroesophageal reflux disease), Hypercalcemia, Hypercholesterolemia, Hypertension, Pneumonia, SVT (supraventricular tachycardia) ([place]), TIA (transient ischemic attack), TIA (transient ischemic attack), UTI (urinary tract infection), and Vertigo.  PSH:    Past Surgical History:  Procedure Laterality Date  . APPENDECTOMY  1954  . BREAST CYST ASPIRATION Right    neg  . CARDIAC CATHETERIZATION  [id number]  . CATARACT EXTRACTION W/PHACO Right 05/26/2017   Procedure: CATARACT EXTRACTION PHACO AND INTRAOCULAR LENS PLACEMENT ([place]) RIGHT;  Surgeon: Leandrew Koyanagi, MD;  Location: [facility];  Service: Ophthalmology;  Laterality: Right;  . CATARACT EXTRACTION W/PHACO Left 08/18/2017   Procedure: CATARACT EXTRACTION PHACO AND INTRAOCULAR LENS PLACEMENT ([place]) LEFT;  Surgeon: Leandrew Koyanagi, MD;  Location: [facility];  Service: Ophthalmology;  Laterality: Left;  . CHOLECYSTECTOMY  1981    Current Outpatient Medications  Medication Sig Dispense Refill  . Ascorbic Acid (VITAMIN C) 1000 MG tablet Take 1,000 mg by mouth daily. am    . aspirin 81 MG tablet Take 81 mg by mouth daily. pm    . clopidogrel (PLAVIX) 75 MG  tablet TAKE 1 TABLET BY MOUTH  EVERY DAY WITH BREAKFAST 90 tablet 3  . Cranberry 500 MG CAPS Take by mouth. am    . cyclobenzaprine (FLEXERIL) 10 MG tablet Take 0.5-1 tablets (5-10 mg total) by mouth at bedtime as needed for muscle spasms. 90 tablet 1  . ezetimibe (ZETIA) 10 MG tablet Take 1 tablet (10 mg total) by mouth daily. 90 tablet 3  . fluticasone (FLONASE) 50 MCG/ACT nasal spray Place 2 sprays into both nostrils daily. (Patient taking differently: Place 2 sprays into both nostrils as needed. pm) 16 g 2  . ibandronate (BONIVA) 150 MG tablet TAKE 1 TABLET BY MOUTH ONCE MONTHLY WITH FULL GLASS WATER ON AN EMPTY STOMACH, NO FOOD, DRINK OR MEDICATION 1 tablet 11  . ibuprofen (ADVIL,MOTRIN) 200 MG tablet Take 200 mg by mouth every 6 (six) hours as needed.    . lovastatin (MEVACOR) 20 MG tablet TAKE 1 TABLET BY MOUTH AT  BEDTIME 90 tablet 3  . meclizine (ANTIVERT) 25 MG tablet Take 1 tablet (25 mg total) by mouth 3 (three) times daily as needed for dizziness. 60 tablet 0  . metoprolol succinate (TOPROL-XL) 50 MG 24 hr tablet Take 1 tablet (50 mg total) by mouth daily. 90 tablet 3  . ondansetron (ZOFRAN ODT) 4 MG disintegrating tablet Take 1 tablet (4 mg total) by mouth every 6 (six) hours as needed for nausea or vomiting. 20 tablet 0  . pantoprazole (PROTONIX) 40 MG tablet TAKE 1 TABLET BY MOUTH  DAILY 90 tablet 3  . traZODone (DESYREL) 50 MG tablet Take 0.5-1 tablets (25-50 mg total) by mouth at bedtime as needed for sleep. 30 tablet 3   No current facility-administered medications for this visit.      Allergies:   Fish oil and Simvastatin   Social History:  The patient  reports that she has never smoked. She has never used smokeless tobacco. She reports that she does not drink alcohol or use drugs.   Family History:   family history includes Breast cancer (age of onset: 58) in her maternal aunt; Dementia in her father; Heart disease in her brother, father, and mother; Stroke in her unknown  relative.    Review of Systems: Review of Systems  Constitutional: Negative.   Respiratory: Negative.   Cardiovascular: Positive for palpitations.       Arm pain  Gastrointestinal: Negative.   Musculoskeletal: Negative.   Neurological: Negative.   Psychiatric/Behavioral: Negative.   All other systems reviewed and are negative.    PHYSICAL EXAM: VS:  BP 130/80 (BP Location: Left Arm, Patient Position: Sitting, Cuff Size: Normal)   Pulse (!) 58   Ht 5' 2.5" (1.588 m)   Wt 148 lb 12 oz (67.5 kg)   BMI 26.77 kg/m  , BMI Body mass index is 26.77 kg/m. Constitutional:  oriented to person, place, and time. No distress.  HENT:  Head: Normocephalic and atraumatic.  Eyes:  no discharge. No scleral icterus.  Neck: Normal range of motion. Neck supple. No JVD present.  Cardiovascular: Normal rate, regular rhythm, normal heart sounds and intact distal pulses. Exam reveals no gallop and no friction rub. No edema No murmur heard. Pulmonary/Chest: Effort normal and breath sounds normal. No stridor. No  respiratory distress.  no wheezes.  no rales.  no tenderness.  Abdominal: Soft.  no distension.  no tenderness.  Musculoskeletal: Normal range of motion.  no  tenderness or deformity.  Neurological:  normal muscle tone. Coordination normal. No atrophy Skin: Skin is warm and dry. No rash noted. not diaphoretic.  Psychiatric:  normal mood and affect. behavior is normal. Thought content normal.    Recent Labs: 05/18/2017: Magnesium 2.1 12/17/2017: ALT 20; BUN 14; Creatinine, Ser 0.83; Hemoglobin 13.2; Platelets 183; Potassium 4.1; Sodium 141; TSH 2.430    Lipid Panel Lab Results  Component Value Date   CHOL 184 12/17/2017   HDL 42 12/17/2017   LDLCALC 102 (H) 12/17/2017   TRIG 198 (H) 12/17/2017      Wt Readings from Last 3 Encounters:  12/27/17 148 lb 12 oz (67.5 kg)  12/15/17 151 lb (68.5 kg)  12/15/17 151 lb (68.5 kg)       ASSESSMENT AND PLAN:  Hypertension, essential,  benign -  Well-controlled on today's visit, no medication changes made Now exercising on a regular basis  SVT (supraventricular tachycardia) (HCC) -  Rare episodes of palpitations, no further work-up needed as she is asymptomatic  H/O transient cerebral ischemia -  No recent neurologic type events concerning for TIA or stroke Significant bruising on aspirin and Plavix Recommend she hold the aspirin and stay on Plavix  Carotid artery disease, unspecified laterality ([place]) -  Most recent scan April 2018 reviewed with her in detail showing less than 39% disease bilaterally  Mixed hyperlipidemia Numbers way down from total cholesterol 260 down to 180 Tolerating the medication  Malaise and fatigue Recommended regular exercise program Doing much better  Nonalcoholic steatohepatitis (NASH) Secondary to obesity, recommended weight loss, low carbohydrate diet Trying to avoid breads, she is on low carbohydrate diet Discussed with her in detail Dietary guide provided  Pre-diabetes  exercise program Continue to avoid carbohydrates  Chest pain, unspecified type reviewed The with atypical symptoms No further work-up needed    Total encounter time more than 25 minutes  Greater than 50% was spent in counseling and coordination of care with the patient   Disposition:   F/U  6 months   Orders Placed This Encounter  Procedures  . EKG 12-Lead     Signed, Esmond Plants, M.D., Ph.D. 12/27/2017  [place], [place]

## 2017-12-27 ENCOUNTER — Ambulatory Visit: Payer: Medicare Other | Admitting: Cardiovascular Disease

## 2017-12-27 ENCOUNTER — Encounter: Payer: Self-pay | Admitting: Cardiovascular Disease

## 2017-12-27 VITALS — BP 130/80 | HR 58 | Ht 62.5 in | Wt 148.8 lb

## 2017-12-27 DIAGNOSIS — E782 Mixed hyperlipidemia: Secondary | ICD-10-CM | POA: Diagnosis not present

## 2017-12-27 DIAGNOSIS — I739 Peripheral vascular disease, unspecified: Secondary | ICD-10-CM

## 2017-12-27 DIAGNOSIS — I10 Essential (primary) hypertension: Secondary | ICD-10-CM | POA: Diagnosis not present

## 2017-12-27 DIAGNOSIS — I779 Disorder of arteries and arterioles, unspecified: Secondary | ICD-10-CM

## 2017-12-27 DIAGNOSIS — I471 Supraventricular tachycardia: Secondary | ICD-10-CM | POA: Diagnosis not present

## 2017-12-27 DIAGNOSIS — K7581 Nonalcoholic steatohepatitis (NASH): Secondary | ICD-10-CM

## 2017-12-27 DIAGNOSIS — Z8673 Personal history of transient ischemic attack (TIA), and cerebral infarction without residual deficits: Secondary | ICD-10-CM

## 2017-12-27 NOTE — Patient Instructions (Addendum)
Medication Instructions:  Stop aspirin Watch the advil  If you need a refill on your cardiac medications before your next appointment, please call your pharmacy.   Lab work: No new labs needed  If you have labs (blood work) drawn today and your tests are completely normal, you will receive your results only by: Marland Kitchen MyChart Message (if you have MyChart) OR . A paper copy in the mail If you have any lab test that is abnormal or we need to change your treatment, we will call you to review the results.  Testing/Procedures: No new testing needed  Follow-Up: At [facility], you and your health needs are our priority.  As part of our continuing mission to provide you with exceptional heart care, we have created designated Provider Care Teams.  These Care Teams include your primary Cardiologist (physician) and Advanced Practice Providers (APPs -  Physician Assistants and Nurse Practitioners) who all work together to provide you with the care you need, when you need it. You will need a follow up appointment in 12 months   Please call our office 2 months in advance to schedule this appointment.   Providers on your designated Care Team:   Murray Hodgkins, NP Christell Faith, PA-C . Marrianne Mood, PA-C  Any Other Special Instructions Will Be Listed Below (If Applicable).

## 2018-01-04 ENCOUNTER — Other Ambulatory Visit: Payer: Self-pay

## 2018-01-04 NOTE — Patient Outreach (Signed)
[facility]) Care Management  01/04/2018  Jade Gardner 05/02/1945 [id number]   Medication Adherence call to Jade Gardner spoke with patient she is no longer taking Lovastatin 20 mg patient was having side effects(low sugar)  Jade Gardner is showing past due under [place].   [facility] Direct Dial [phone]  Fax [phone] [email or web address]

## 2018-01-19 ENCOUNTER — Ambulatory Visit
Admission: RE | Admit: 2018-01-19 | Discharge: 2018-01-19 | Disposition: A | Discharge status: Home / Self Care | Payer: Medicare Other | Source: Ambulatory Visit | Attending: Family Medicine | Admitting: Family Medicine

## 2018-01-19 ENCOUNTER — Other Ambulatory Visit: Payer: Medicare Other

## 2018-01-19 DIAGNOSIS — M810 Age-related osteoporosis without current pathological fracture: Secondary | ICD-10-CM

## 2018-01-19 DIAGNOSIS — M8589 Other specified disorders of bone density and structure, multiple sites: Secondary | ICD-10-CM | POA: Diagnosis not present

## 2018-01-19 DIAGNOSIS — Z780 Asymptomatic menopausal state: Secondary | ICD-10-CM | POA: Diagnosis not present

## 2018-01-20 ENCOUNTER — Telehealth: Payer: Self-pay

## 2018-01-20 DIAGNOSIS — M810 Age-related osteoporosis without current pathological fracture: Secondary | ICD-10-CM

## 2018-01-20 NOTE — Telephone Encounter (Signed)
Patient advised. She agrees to proceed with Endo referral. Referral ordered.

## 2018-01-20 NOTE — Telephone Encounter (Signed)
-----   Message from [name], MD sent at 01/20/2018  9:03 AM EST ----- Bone density scan shows osteoporosis with slight worsening of bone density in femur.  I know patient has taken Boniva in the past.  She may be a good candidate for newer treatment for osteoporosis.  Wonder if she'd be interested in an Endo referral to discuss these options further.  We can place it if she agrees.

## 2018-01-21 DIAGNOSIS — H26493 Other secondary cataract, bilateral: Secondary | ICD-10-CM | POA: Diagnosis not present

## 2018-02-12 ENCOUNTER — Ambulatory Visit
Admission: EM | Admit: 2018-02-12 | Discharge: 2018-02-12 | Disposition: A | Discharge status: Home / Self Care | Payer: Medicare Other | Attending: Physician Assistant | Admitting: Physician Assistant

## 2018-02-12 ENCOUNTER — Other Ambulatory Visit: Payer: Self-pay

## 2018-02-12 ENCOUNTER — Encounter: Payer: Self-pay | Admitting: Gynecology

## 2018-02-12 DIAGNOSIS — J0140 Acute pansinusitis, unspecified: Secondary | ICD-10-CM | POA: Diagnosis not present

## 2018-02-12 MED ORDER — FLUTICASONE PROPIONATE 50 MCG/ACT NA SUSP: 1.0000 | Freq: Two times a day (BID) | NASAL | 0 refills | Status: AC

## 2018-02-12 MED ORDER — AMOXICILLIN-POT CLAVULANATE 875-125 MG PO TABS: 1.0000 | ORAL_TABLET | Freq: Two times a day (BID) | ORAL | 0 refills | Status: AC

## 2018-02-12 NOTE — Discharge Instructions (Addendum)
SINUSITIS: Reviewed use of a nasal saline irrigation system. May consider use of intranasal steroids, such as Flonase as well. Use medications as directed. If antibiotics are prescribed, take the full course of antibiotics. Also consider use of Sudafed or Mucinex D. Increase rest, fluids. If you do not improve or if you worsen after a course of antibiotics, you should be re-examined. You may need a different antibiotic or further evaluation with imaging or an exam of the inside of the sinuses

## 2018-02-12 NOTE — ED Triage Notes (Signed)
Patient c/o sinus infection x 2 days / diarrhea x yesterday.

## 2018-02-12 NOTE — ED Provider Notes (Signed)
[facility]    CSN: [id number] Arrival date & time: 02/12/18  0801     History   Chief Complaint Chief Complaint  Patient presents with  . Sinusitis    HPI MARRIANNE SICA is a 72 y.o. female. Patient presents with sudden onset of frontal headache, maxillary sinus pain/pressure, purulent nasal congestion, non productive cough, sore throat, facial pain radiating to teeth and body aches yesterday. Admits to one episode of diarrhea yesterday that resolved. Denies fever. Admits to fatigue. Has taken Mucinex w/o much relief. She admits to a history of sinus infections and says she has about 2 per year. She has no other concerns/complaints today.  HPI  Past Medical History:  Diagnosis Date  . Diverticulosis   . GERD (gastroesophageal reflux disease)   . Hypercalcemia    Secondar to calcium supplements. Normal PTH  . Hypercholesterolemia   . Hypertension    controlled on meds  . Pneumonia    in past  . SVT (supraventricular tachycardia) (HCC)    history of  . TIA (transient ischemic attack)    x2  . TIA (transient ischemic attack)    x2  . UTI (urinary tract infection)   . Vertigo     Patient Active Problem List   Diagnosis Date Noted  . Elevated TSH 05/18/2017  . Leg cramps 05/18/2017  . Hyperlipidemia 11/28/2015  . H/O transient cerebral ischemia 05/29/2015  . Personal history of other diseases of the musculoskeletal system and connective tissue 05/29/2015  . Bronchitis, mucopurulent recurrent ([place]) 05/29/2015  . Diverticulosis 08/24/2014  . Fibrocystic breast disease 08/24/2014  . Anemia 08/23/2014  . Allergic rhinitis 08/23/2014  . DDD (degenerative disc disease), thoracolumbar 08/23/2014  . Hearing loss 08/23/2014  . Insomnia 08/23/2014  . Osteoporosis 08/23/2014  . Overweight (BMI 25.0-29.9) 08/23/2014  . Pre-diabetes 08/23/2014  . Obstructive sleep apnea 08/23/2014  . Varicose veins of both lower extremities without ulcer or inflammation  08/23/2014  . B12 deficiency 08/23/2014  . Vitamin D deficiency 08/23/2014  . Carotid stenosis 09/15/2013  . SVT (supraventricular tachycardia) ([place]) 05/26/2011  . Nonalcoholic steatohepatitis (NASH) 05/20/2009  . Diaphragmatic hernia 08/10/2008  . Cardiac conduction disorder 08/10/2008  . Benign essential HTN 08/10/2008  . GERD (gastroesophageal reflux disease) 08/10/2008    Past Surgical History:  Procedure Laterality Date  . APPENDECTOMY  1954  . BREAST CYST ASPIRATION Right    neg  . CARDIAC CATHETERIZATION  [id number]  . CATARACT EXTRACTION W/PHACO Right 05/26/2017   Procedure: CATARACT EXTRACTION PHACO AND INTRAOCULAR LENS PLACEMENT ([place]) RIGHT;  Surgeon: Leandrew Koyanagi, MD;  Location: [facility];  Service: Ophthalmology;  Laterality: Right;  . CATARACT EXTRACTION W/PHACO Left 08/18/2017   Procedure: CATARACT EXTRACTION PHACO AND INTRAOCULAR LENS PLACEMENT ([place]) LEFT;  Surgeon: Leandrew Koyanagi, MD;  Location: [facility];  Service: Ophthalmology;  Laterality: Left;  . CHOLECYSTECTOMY  1981    OB History   No obstetric history on file.      Home Medications    Prior to Admission medications   Medication Sig Start Date End Date Taking? Authorizing Provider  Ascorbic Acid (VITAMIN C) 1000 MG tablet Take 1,000 mg by mouth daily. am   Yes [provider]  clopidogrel (PLAVIX) 75 MG tablet TAKE 1 TABLET BY MOUTH  EVERY DAY WITH BREAKFAST 07/07/17  Yes Bacigalupo, Dionne Bucy, MD  Cranberry 500 MG CAPS Take by mouth. am   Yes [provider]  cyclobenzaprine (FLEXERIL) 10 MG tablet Take 0.5-1 tablets (5-10  mg total) by mouth at bedtime as needed for muscle spasms. 06/26/16  Yes Chrismon, Vickki Muff, PA  ezetimibe (ZETIA) 10 MG tablet Take 1 tablet (10 mg total) by mouth daily. 05/21/17  Yes Gollan, Kathlene November, MD  fluticasone (FLONASE) 50 MCG/ACT nasal spray Place 2 sprays into both nostrils daily. Patient taking differently: Place 2 sprays  into both nostrils as needed. pm 04/21/15  Yes Jan Fireman, PA-C  ibuprofen (ADVIL,MOTRIN) 200 MG tablet Take 200 mg by mouth every 6 (six) hours as needed.   Yes [provider]  lovastatin (MEVACOR) 20 MG tablet TAKE 1 TABLET BY MOUTH AT  BEDTIME 03/08/17  Yes Gollan, Kathlene November, MD  meclizine (ANTIVERT) 25 MG tablet Take 1 tablet (25 mg total) by mouth 3 (three) times daily as needed for dizziness. 07/20/17  Yes Bacigalupo, Dionne Bucy, MD  metoprolol succinate (TOPROL-XL) 50 MG 24 hr tablet Take 1 tablet (50 mg total) by mouth daily. 08/20/17  Yes Bacigalupo, Dionne Bucy, MD  ondansetron (ZOFRAN ODT) 4 MG disintegrating tablet Take 1 tablet (4 mg total) by mouth every 6 (six) hours as needed for nausea or vomiting. 07/08/15  Yes Delman Kitten, MD  pantoprazole (PROTONIX) 40 MG tablet TAKE 1 TABLET BY MOUTH  DAILY 07/07/17  Yes Bacigalupo, Dionne Bucy, MD  amoxicillin-clavulanate (AUGMENTIN) 875-125 MG tablet Take 1 tablet by mouth every 12 (twelve) hours for 7 days. 02/12/18 02/19/18  Laurene Footman B, PA-C  fluticasone (FLONASE) 50 MCG/ACT nasal spray Place 1 spray into both nostrils 2 (two) times daily. 02/12/18   Laurene Footman B, PA-C  ibandronate (BONIVA) 150 MG tablet TAKE 1 TABLET BY MOUTH ONCE MONTHLY WITH FULL GLASS WATER ON AN EMPTY STOMACH, NO FOOD, DRINK OR MEDICATION 09/23/17   [name], MD  traZODone (DESYREL) 50 MG tablet Take 0.5-1 tablets (25-50 mg total) by mouth at bedtime as needed for sleep. 12/15/17   [name], MD    Family History Family History  Problem Relation Age of Onset  . Heart disease Father   . Dementia Father   . Heart disease Mother   . Heart disease Brother   . Stroke Other   . Breast cancer Maternal Aunt 44    Social History Social History   Tobacco Use  . Smoking status: Never Smoker  . Smokeless tobacco: Never Used  Substance Use Topics  . Alcohol use: No  . Drug use: No     Allergies   Fish oil and Simvastatin   Review of  Systems Review of Systems  Constitutional: Positive for fatigue. Negative for chills and fever.  HENT: Positive for congestion, postnasal drip, rhinorrhea, sinus pressure, sinus pain and sore throat. Negative for ear pain.   Eyes: Negative for discharge, redness and itching.  Respiratory: Positive for cough. Negative for chest tightness and shortness of breath.   Cardiovascular: Negative for chest pain.  Gastrointestinal: Positive for diarrhea (one episode). Negative for abdominal pain, nausea and vomiting.  Genitourinary: Negative for difficulty urinating and dysuria.  Musculoskeletal: Negative for arthralgias and myalgias.  Skin: Negative for color change and rash.  Allergic/Immunologic: Negative for environmental allergies.  Neurological: Positive for headaches. Negative for dizziness, weakness and light-headedness.  Hematological: Negative for adenopathy.     Physical Exam Triage Vital Signs ED Triage Vitals  Enc Vitals Group     BP 02/12/18 0815 (!) 144/79     Pulse Rate 02/12/18 0815 74     Resp 02/12/18 0815 16  Temp 02/12/18 0815 98 F (36.7 C)     Temp Source 02/12/18 0815 Oral     SpO2 02/12/18 0815 98 %     Weight 02/12/18 0816 148 lb (67.1 kg)     Height 02/12/18 0816 5' 2"  (1.575 m)     Head Circumference --      Peak Flow --      Pain Score 02/12/18 0816 5     Pain Loc --      Pain Edu? --      Excl. in [place]? --    No data found.  Updated Vital Signs BP (!) 144/79 (BP Location: Left Arm)   Pulse 74   Temp 98 F (36.7 C) (Oral)   Resp 16   Ht 5' 2"  (1.575 m)   Wt 148 lb (67.1 kg)   SpO2 98%   BMI 27.07 kg/m       Physical Exam Vitals signs and nursing note reviewed.  Constitutional:      General: She is not in acute distress.    Appearance: Normal appearance. She is normal weight.  HENT:     Head: Normocephalic and atraumatic.     Right Ear: Tympanic membrane, ear canal and external ear normal.     Left Ear: Tympanic membrane, ear canal and  external ear normal.     Nose: Mucosal edema, congestion and rhinorrhea (moderate purulent) present.     Right Sinus: Maxillary sinus tenderness and frontal sinus tenderness present.     Left Sinus: Maxillary sinus tenderness and frontal sinus tenderness present.     Mouth/Throat:     Mouth: Mucous membranes are dry.     Pharynx: Posterior oropharyngeal erythema (mild, +PND) present.  Eyes:     General: No scleral icterus.       Right eye: No discharge.        Left eye: No discharge.     Conjunctiva/sclera: Conjunctivae normal.  Cardiovascular:     Rate and Rhythm: Normal rate and regular rhythm.     Heart sounds: No murmur.  Pulmonary:     Effort: Pulmonary effort is normal.     Breath sounds: Normal breath sounds. No wheezing, rhonchi or rales.  Abdominal:     General: Bowel sounds are normal.     Palpations: Abdomen is soft.     Tenderness: There is no abdominal tenderness.  Lymphadenopathy:     Cervical: Cervical adenopathy present.  Skin:    General: Skin is warm and dry.     Findings: No erythema.  Neurological:     General: No focal deficit present.     Mental Status: She is alert and oriented to person, place, and time.     Motor: No weakness.     Gait: Gait normal.  Psychiatric:        Mood and Affect: Mood normal.        Behavior: Behavior normal.        Thought Content: Thought content normal.      UC Treatments / Results  Labs (all labs ordered are listed, but only abnormal results are displayed) Labs Reviewed - No data to display  EKG None  Radiology No results found.  Procedures Procedures (including critical care time)  Medications Ordered in UC Medications - No data to display  Initial Impression / Assessment and Plan / UC Course  I have reviewed the triage vital signs and the nursing notes.  Pertinent labs & imaging results that were available  during my care of the patient were reviewed by me and considered in my medical decision making  (see chart for details).     Final Clinical Impressions(s) / UC Diagnoses   Final diagnoses:  Acute pansinusitis, recurrence not specified     Discharge Instructions     SINUSITIS: Reviewed use of a nasal saline irrigation system. May consider use of intranasal steroids, such as Flonase as well. Use medications as directed. If antibiotics are prescribed, take the full course of antibiotics. Also consider use of Sudafed or Mucinex D. Increase rest, fluids. If you do not improve or if you worsen after a course of antibiotics, you should be re-examined. You may need a different antibiotic or further evaluation with imaging or an exam of the inside of the sinuses     ED Prescriptions    Medication Sig Dispense Auth. Provider   amoxicillin-clavulanate (AUGMENTIN) 875-125 MG tablet Take 1 tablet by mouth every 12 (twelve) hours for 7 days. 14 tablet Laurene Footman B, PA-C   fluticasone (FLONASE) 50 MCG/ACT nasal spray Place 1 spray into both nostrils 2 (two) times daily. 1 g Danton Clap, PA-C     Controlled Substance Prescriptions [place] Controlled Substance Registry consulted? Not Applicable   Gretta Cool 02/14/18 1134

## 2018-02-15 DIAGNOSIS — K219 Gastro-esophageal reflux disease without esophagitis: Secondary | ICD-10-CM | POA: Diagnosis not present

## 2018-02-15 DIAGNOSIS — M810 Age-related osteoporosis without current pathological fracture: Secondary | ICD-10-CM | POA: Diagnosis not present

## 2018-02-18 ENCOUNTER — Telehealth: Payer: Self-pay | Admitting: Physician Assistant

## 2018-02-18 DIAGNOSIS — R059 Cough, unspecified: Secondary | ICD-10-CM

## 2018-02-18 DIAGNOSIS — R05 Cough: Secondary | ICD-10-CM

## 2018-02-18 MED ORDER — PROMETHAZINE-DM 6.25-15 MG/5ML PO SYRP: 5.0000 mL | ORAL_SOLUTION | Freq: Four times a day (QID) | ORAL | 0 refills | Status: DC | PRN

## 2018-02-18 NOTE — Telephone Encounter (Signed)
Sent in promethazine dm for nighttime cough. Recommend Mucinex DM for daytime cough if needed.

## 2018-02-18 NOTE — Telephone Encounter (Signed)
Patient was seen at the Urgent Care on Saturday and given Amoxicillin 875 mg. For sinus infection.   However, she has a horrible cough that is keeping her up all night.  She was not given a cough med at Urgent Care.  Can you call her in something or do you need to see her?  Please let patient know.

## 2018-02-18 NOTE — Telephone Encounter (Signed)
Patient was advised.  

## 2018-03-15 ENCOUNTER — Other Ambulatory Visit: Payer: Self-pay | Admitting: *Deleted

## 2018-03-15 ENCOUNTER — Telehealth: Payer: Self-pay | Admitting: Cardiovascular Disease

## 2018-03-15 MED ORDER — METOPROLOL SUCCINATE ER 50 MG PO TB24: 50.0000 mg | ORAL_TABLET | Freq: Every day | ORAL | 3 refills | Status: DC

## 2018-03-15 NOTE — Telephone Encounter (Signed)
°*  STAT* If patient is at the pharmacy, call can be transferred to refill team.   1. Which medications need to be refilled? (please list name of each medication and dose if known)  Metoprolol succinate 50 MG - 1 tablet daily   2. Which pharmacy/location (including street and city if local pharmacy) is medication to be sent to? [facility]  3. Do they need a 30 day or 90 day supply? 90 day

## 2018-03-15 NOTE — Telephone Encounter (Signed)
Requested Prescriptions   Signed Prescriptions Disp Refills  . metoprolol succinate (TOPROL-XL) 50 MG 24 hr tablet 90 tablet 3    Sig: Take 1 tablet (50 mg total) by mouth daily.    Authorizing Provider: Minna Merritts    Ordering User: Britt Bottom

## 2018-04-01 DIAGNOSIS — M810 Age-related osteoporosis without current pathological fracture: Secondary | ICD-10-CM | POA: Diagnosis not present

## 2018-04-08 ENCOUNTER — Encounter: Payer: Self-pay | Admitting: Physician Assistant

## 2018-04-08 ENCOUNTER — Ambulatory Visit (INDEPENDENT_AMBULATORY_CARE_PROVIDER_SITE_OTHER): Payer: Medicare Other | Admitting: Physician Assistant

## 2018-04-08 VITALS — BP 152/85 | HR 66 | Temp 99.0°F | Resp 16 | Ht 62.5 in | Wt 144.0 lb

## 2018-04-08 DIAGNOSIS — N3090 Cystitis, unspecified without hematuria: Secondary | ICD-10-CM

## 2018-04-08 DIAGNOSIS — R300 Dysuria: Secondary | ICD-10-CM | POA: Diagnosis not present

## 2018-04-08 LAB — POCT URINALYSIS DIPSTICK
Bilirubin, UA: NEGATIVE
Glucose, UA: NEGATIVE
Ketones, UA: NEGATIVE
Nitrite, UA: POSITIVE
Protein, UA: NEGATIVE
Spec Grav, UA: 1.01 (ref 1.010–1.025)
Urobilinogen, UA: 0.2 E.U./dL
pH, UA: 6.5 (ref 5.0–8.0)

## 2018-04-08 MED ORDER — SULFAMETHOXAZOLE-TRIMETHOPRIM 800-160 MG PO TABS: 1.0000 | ORAL_TABLET | Freq: Two times a day (BID) | ORAL | 0 refills | Status: AC

## 2018-04-08 NOTE — Progress Notes (Signed)
Patient: Jade Gardner Female    DOB: 1946/02/13   73 y.o.   MRN: [id number] Visit Date: 04/08/2018  Today's Provider: Trinna Post, PA-C   Chief Complaint  Patient presents with  . Urinary Tract Infection    possibly    Subjective:     Urinary Tract Infection   This is a new problem. The current episode started yesterday. The quality of the pain is described as aching. There has been no fever. Associated symptoms include frequency, hematuria and urgency. She has tried increased fluids for the symptoms. The treatment provided no relief.       Allergies  Allergen Reactions  . Fish Oil Itching    Burning and stinging  . Simvastatin     Muscle Pain, Joint pain     Current Outpatient Medications:  .  Ascorbic Acid (VITAMIN C) 1000 MG tablet, Take 1,000 mg by mouth daily. am, Disp: , Rfl:  .  clopidogrel (PLAVIX) 75 MG tablet, TAKE 1 TABLET BY MOUTH  EVERY DAY WITH BREAKFAST, Disp: 90 tablet, Rfl: 3 .  Cranberry 500 MG CAPS, Take by mouth. am, Disp: , Rfl:  .  cyclobenzaprine (FLEXERIL) 10 MG tablet, Take 0.5-1 tablets (5-10 mg total) by mouth at bedtime as needed for muscle spasms., Disp: 90 tablet, Rfl: 1 .  ezetimibe (ZETIA) 10 MG tablet, Take 1 tablet (10 mg total) by mouth daily., Disp: 90 tablet, Rfl: 3 .  fluticasone (FLONASE) 50 MCG/ACT nasal spray, Place 2 sprays into both nostrils daily. (Patient taking differently: Place 2 sprays into both nostrils as needed. pm), Disp: 16 g, Rfl: 2 .  fluticasone (FLONASE) 50 MCG/ACT nasal spray, Place 1 spray into both nostrils 2 (two) times daily., Disp: 1 g, Rfl: 0 .  ibandronate (BONIVA) 150 MG tablet, TAKE 1 TABLET BY MOUTH ONCE MONTHLY WITH FULL GLASS WATER ON AN EMPTY STOMACH, NO FOOD, DRINK OR MEDICATION, Disp: 1 tablet, Rfl: 11 .  ibuprofen (ADVIL,MOTRIN) 200 MG tablet, Take 200 mg by mouth every 6 (six) hours as needed., Disp: , Rfl:  .  lovastatin (MEVACOR) 20 MG tablet, TAKE 1 TABLET BY MOUTH AT  BEDTIME, Disp:  90 tablet, Rfl: 3 .  meclizine (ANTIVERT) 25 MG tablet, Take 1 tablet (25 mg total) by mouth 3 (three) times daily as needed for dizziness., Disp: 60 tablet, Rfl: 0 .  metoprolol succinate (TOPROL-XL) 50 MG 24 hr tablet, Take 1 tablet (50 mg total) by mouth daily., Disp: 90 tablet, Rfl: 3 .  ondansetron (ZOFRAN ODT) 4 MG disintegrating tablet, Take 1 tablet (4 mg total) by mouth every 6 (six) hours as needed for nausea or vomiting., Disp: 20 tablet, Rfl: 0 .  pantoprazole (PROTONIX) 40 MG tablet, TAKE 1 TABLET BY MOUTH  DAILY, Disp: 90 tablet, Rfl: 3 .  promethazine-dextromethorphan (PROMETHAZINE-DM) 6.25-15 MG/5ML syrup, Take 5 mLs by mouth 4 (four) times daily as needed for cough., Disp: 120 mL, Rfl: 0 .  traZODone (DESYREL) 50 MG tablet, Take 0.5-1 tablets (25-50 mg total) by mouth at bedtime as needed for sleep., Disp: 30 tablet, Rfl: 3  Review of Systems  Constitutional: Negative.   Cardiovascular: Negative for chest pain, palpitations and leg swelling.  Genitourinary: Positive for frequency, hematuria and urgency.    Social History   Tobacco Use  . Smoking status: Never Smoker  . Smokeless tobacco: Never Used  Substance Use Topics  . Alcohol use: No      Objective:   BP Marland Kitchen)  152/85 (BP Location: Left Arm, Patient Position: Sitting, Cuff Size: Normal) Comment: electronic cuff  Pulse 66   Temp 99 F (37.2 C)   Resp 16   Ht 5' 2.5" (1.588 m)   Wt 144 lb (65.3 kg)   SpO2 96%   BMI 25.92 kg/m  Vitals:   04/08/18 1546  BP: (!) 152/85  Pulse: 66  Resp: 16  Temp: 99 F (37.2 C)  SpO2: 96%  Weight: 144 lb (65.3 kg)  Height: 5' 2.5" (1.588 m)     Physical Exam Constitutional:      General: She is not in acute distress.    Appearance: She is well-developed. She is not diaphoretic.  Cardiovascular:     Rate and Rhythm: Normal rate and regular rhythm.  Pulmonary:     Effort: Pulmonary effort is normal.     Breath sounds: Normal breath sounds.  Abdominal:     General:  Bowel sounds are normal. There is no distension.     Palpations: Abdomen is soft.     Tenderness: There is abdominal tenderness in the suprapubic area. There is no guarding or rebound.  Skin:    General: Skin is warm and dry.  Neurological:     Mental Status: She is alert and oriented to person, place, and time.  Psychiatric:        Behavior: Behavior normal.         Assessment & Plan    1. Dysuria  - POCT urinalysis dipstick - Urine Culture - sulfamethoxazole-trimethoprim (BACTRIM DS,SEPTRA DS) 800-160 MG tablet; Take 1 tablet by mouth 2 (two) times daily for 5 days.  Dispense: 10 tablet; Refill: 0  2. Cystitis  - sulfamethoxazole-trimethoprim (BACTRIM DS,SEPTRA DS) 800-160 MG tablet; Take 1 tablet by mouth 2 (two) times daily for 5 days.  Dispense: 10 tablet; Refill: 0  Return if symptoms worsen or fail to improve.  The entirety of the information documented in the History of Present Illness, Review of Systems and Physical Exam were personally obtained by me. Portions of this information were initially documented by Wilburt Finlay, CMA and reviewed by me for thoroughness and accuracy.          Trinna Post, PA-C  [facility]

## 2018-04-08 NOTE — Patient Instructions (Signed)

## 2018-04-10 LAB — URINE CULTURE

## 2018-04-13 ENCOUNTER — Telehealth: Payer: Self-pay

## 2018-04-13 NOTE — Telephone Encounter (Signed)
-----   Message from Trinna Post, [place] sent at 04/12/2018  2:46 PM EST ----- Urine culture shows e coli sensitive to antibiotic given. How is she feeling?

## 2018-04-13 NOTE — Telephone Encounter (Signed)
lmtcb

## 2018-04-15 NOTE — Telephone Encounter (Signed)
Patient advised as below.  

## 2018-04-15 NOTE — Telephone Encounter (Signed)
-----   Message from [place], [place] sent at 04/13/2018 12:03 PM EST -----  ----- Message ----- From: Paulene Floor Sent: 04/12/2018   2:46 PM EST To: Terrilee Croak Nurse Pool  Urine culture shows e coli sensitive to antibiotic given. How is she feeling?

## 2018-05-06 ENCOUNTER — Other Ambulatory Visit: Payer: Self-pay | Admitting: Cardiovascular Disease

## 2018-06-16 ENCOUNTER — Ambulatory Visit: Payer: Medicare Other | Admitting: Family Medicine

## 2018-07-22 ENCOUNTER — Other Ambulatory Visit: Payer: Self-pay | Admitting: Family Medicine

## 2018-07-26 DIAGNOSIS — G459 Transient cerebral ischemic attack, unspecified: Secondary | ICD-10-CM | POA: Diagnosis not present

## 2018-07-26 DIAGNOSIS — Z1231 Encounter for screening mammogram for malignant neoplasm of breast: Secondary | ICD-10-CM | POA: Diagnosis not present

## 2018-07-26 DIAGNOSIS — J45909 Unspecified asthma, uncomplicated: Secondary | ICD-10-CM | POA: Diagnosis not present

## 2018-07-26 DIAGNOSIS — I739 Peripheral vascular disease, unspecified: Secondary | ICD-10-CM | POA: Diagnosis not present

## 2018-07-26 DIAGNOSIS — I10 Essential (primary) hypertension: Secondary | ICD-10-CM | POA: Diagnosis not present

## 2018-07-26 DIAGNOSIS — I471 Supraventricular tachycardia: Secondary | ICD-10-CM | POA: Diagnosis not present

## 2018-07-26 DIAGNOSIS — E785 Hyperlipidemia, unspecified: Secondary | ICD-10-CM | POA: Diagnosis not present

## 2018-07-26 DIAGNOSIS — Z1211 Encounter for screening for malignant neoplasm of colon: Secondary | ICD-10-CM | POA: Diagnosis not present

## 2018-07-26 DIAGNOSIS — K760 Fatty (change of) liver, not elsewhere classified: Secondary | ICD-10-CM | POA: Diagnosis not present

## 2018-07-26 NOTE — Telephone Encounter (Signed)
[facility] faxed refill request for the following medications:  clopidogrel (PLAVIX) 75 MG tablet   90 day supply  Please advise. Thanks TNP

## 2018-07-27 ENCOUNTER — Other Ambulatory Visit: Payer: Self-pay | Admitting: Specialist

## 2018-07-27 DIAGNOSIS — Z1231 Encounter for screening mammogram for malignant neoplasm of breast: Secondary | ICD-10-CM

## 2018-08-05 DIAGNOSIS — R932 Abnormal findings on diagnostic imaging of liver and biliary tract: Secondary | ICD-10-CM | POA: Diagnosis not present

## 2018-08-05 DIAGNOSIS — K760 Fatty (change of) liver, not elsewhere classified: Secondary | ICD-10-CM | POA: Diagnosis not present

## 2018-09-05 ENCOUNTER — Other Ambulatory Visit: Payer: Self-pay | Admitting: Cardiovascular Disease

## 2018-09-06 ENCOUNTER — Other Ambulatory Visit: Payer: Self-pay

## 2018-09-06 ENCOUNTER — Ambulatory Visit
Admission: RE | Admit: 2018-09-06 | Discharge: 2018-09-06 | Disposition: A | Discharge status: Home / Self Care | Payer: Medicare Other | Source: Ambulatory Visit | Attending: Specialist | Admitting: Specialist

## 2018-09-06 DIAGNOSIS — Z1231 Encounter for screening mammogram for malignant neoplasm of breast: Secondary | ICD-10-CM | POA: Diagnosis not present

## 2018-10-20 DIAGNOSIS — K760 Fatty (change of) liver, not elsewhere classified: Secondary | ICD-10-CM | POA: Diagnosis not present

## 2018-10-23 IMAGING — MG MM DIGITAL SCREENING BILAT W/ TOMO W/ CAD
8 series · 8 of 24 positions shown · non-contrast
Comparison: Previous exam(s).

CLINICAL DATA: Screening.

EXAM:
DIGITAL SCREENING BILATERAL MAMMOGRAM WITH TOMO AND CAD

[R CC synth-2D]
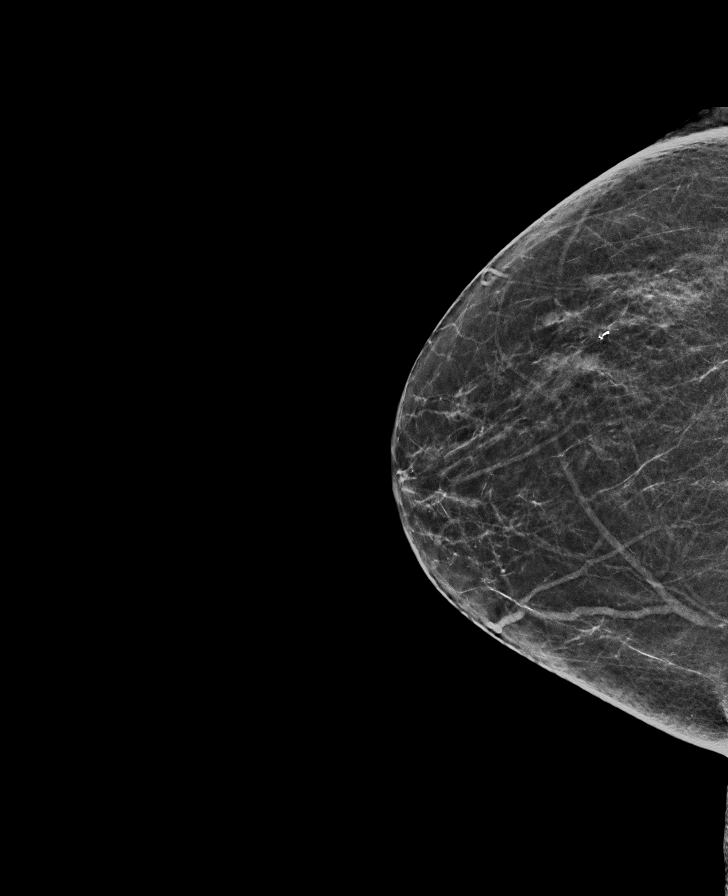

[L MLO synth-2D]
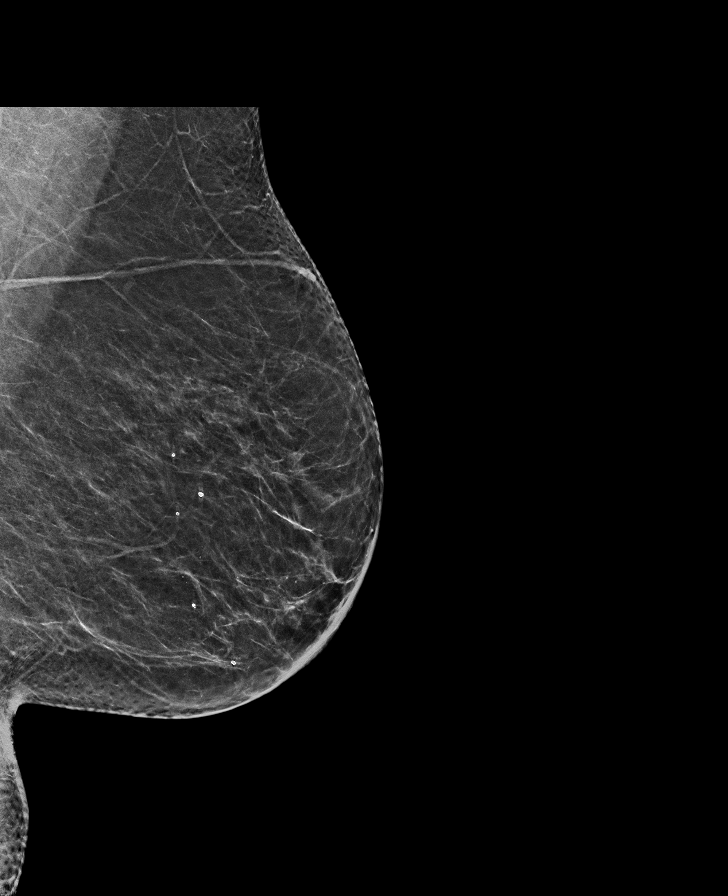

[L CC synth-2D]
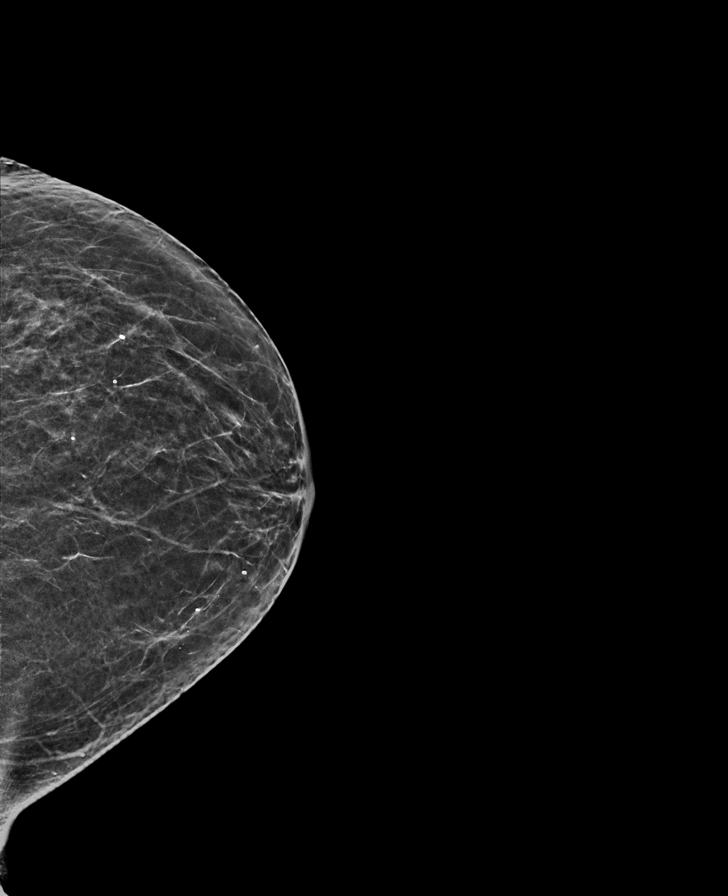

[R MLO synth-2D]
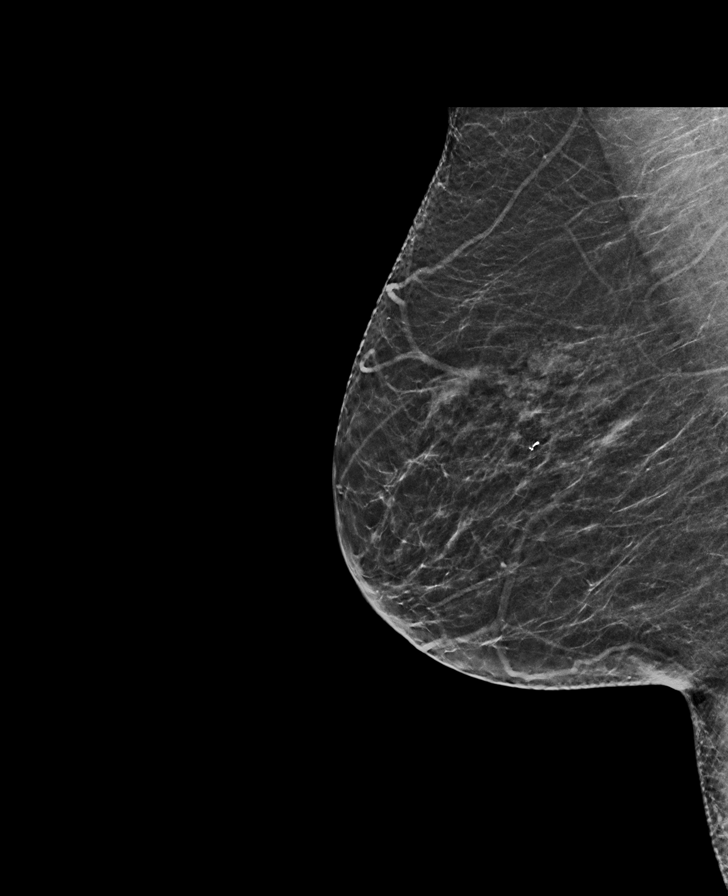

[L CC tomo · tomo slice 27/54.0]
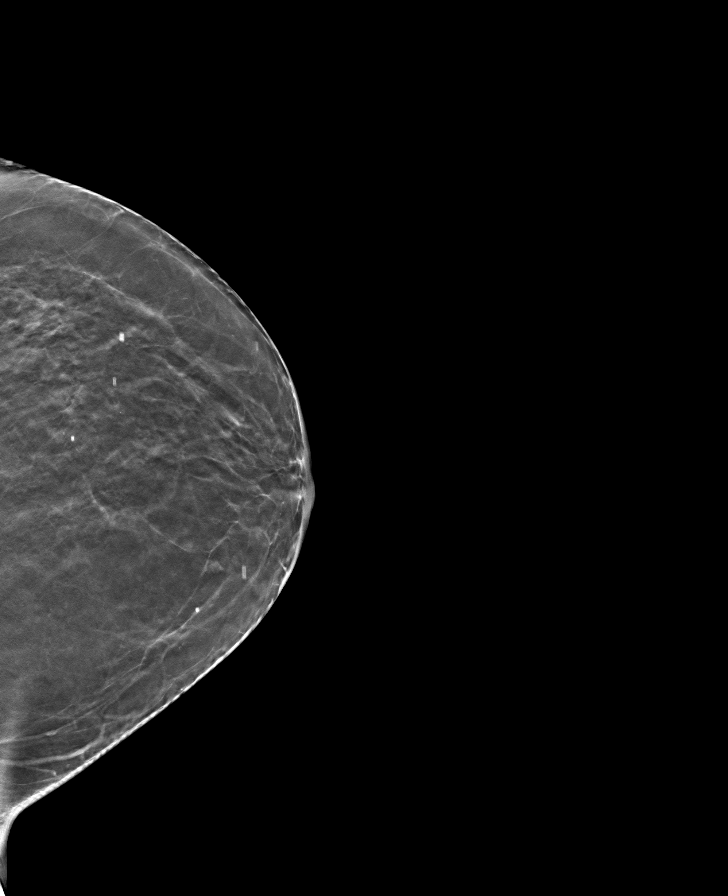

[R CC tomo · tomo slice 29/56.0]
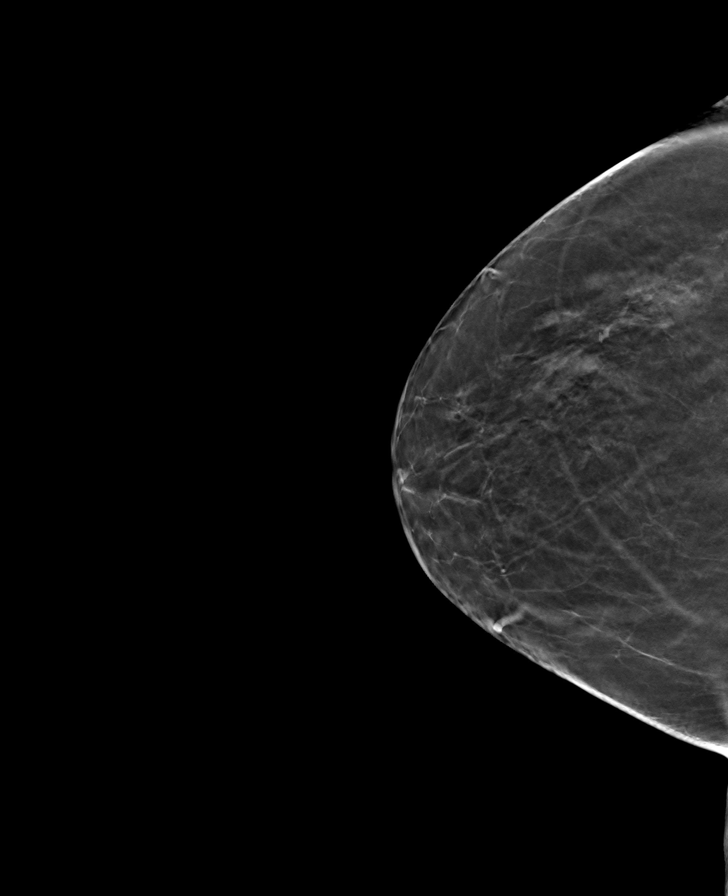

[R MLO tomo · tomo slice 29/58.0]
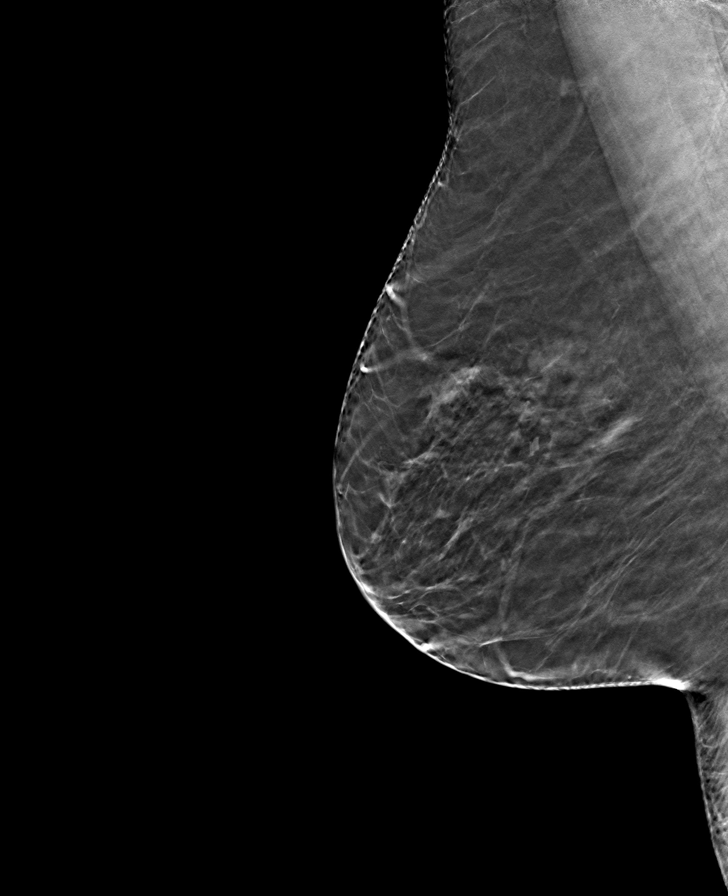

[L MLO tomo · tomo slice 31/62.0]
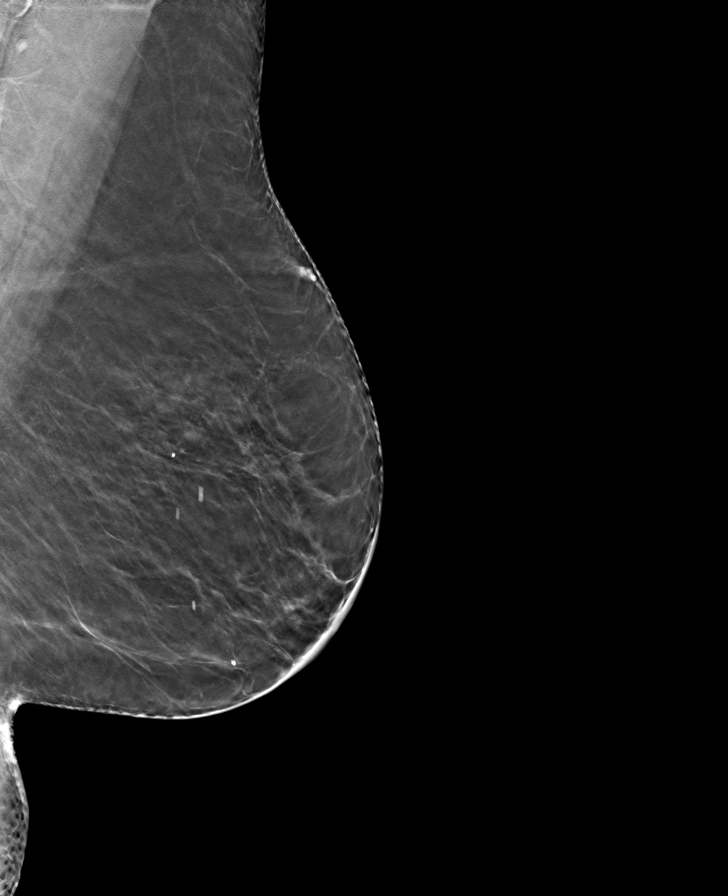

[8 of 24 positions shown; findings below may reference images not displayed]

ACR Breast Density Category b: There are scattered areas of
fibroglandular density.
FINDINGS: There are no findings suspicious for malignancy. Images were
processed with CAD.
IMPRESSION: No mammographic evidence of malignancy. A result letter of this
screening mammogram will be mailed directly to the patient.

RECOMMENDATION:
Screening mammogram in one year. (Code:[id number])

BI-RADS CATEGORY  1: Negative.

## 2018-11-22 DIAGNOSIS — Z23 Encounter for immunization: Secondary | ICD-10-CM | POA: Diagnosis not present

## 2018-11-22 DIAGNOSIS — Z0000 Encounter for general adult medical examination without abnormal findings: Secondary | ICD-10-CM | POA: Diagnosis not present

## 2018-12-13 ENCOUNTER — Telehealth: Payer: Self-pay

## 2018-12-13 NOTE — Telephone Encounter (Signed)
Noted. Changed Care teams in chart

## 2018-12-13 NOTE — Telephone Encounter (Signed)
Called pt to verify AWV and pt states she was going to call us to cancel this apt and her CPE. Pt is now seeing another PCP that is closer to her. FYI!

## 2018-12-18 NOTE — Progress Notes (Signed)
Cardiology Office Note  Date:  12/20/2018   ID:  Jade Gardner, DOB 06/28/1945, MRN [id number]  PCP:  System, Provider Not In   Chief Complaint  Patient presents with  . Other    12 month follow up. Patient c/o some swelling in Right foot. meds reviewed verbally with patient.     HPI:  [name] is a very pleasant 73 year old woman with a history of  SVT,  TIA in 1856 of uncertain etiology,   H/A,  hypertension,  hyperlipidemia,  reaction to simvastatin/Crestor/Lipitor, Jade Gardner,  cardiac catheterization in May 23, 1989 showing no significant coronary artery disease  who presents for routine followup of her SVT and high cholesterol, hypertension, PAD 40% bilateral carotid arterial disease  Lab work reviewed with her in detail HBA1c 5.4 Total chol  184, LDL 102, numbers are higher, off lovastatin  Was walking 2 miles a day Less active now  Off lovastatin for unclear reasons, thinks it just fell off her list Continues on Zetia daily  Relatively benign appearing LFTs on lab review  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 She had acute onset of slurred speech, headache, dizziness, blurred vision, numbness on her for head. She went to the emergency room and blood pressure was 200/100. She stayed most of the day and was discharged home. TIA happened while on low-dose aspirin  CT scan of the head showed no stroke MRI of the head showed no acute stroke Lab work is essentially normal including basic metabolic panel and CBC  echocardiogram done that showed no ASD or PFO, otherwise normal study Carotid ultrasound showed  bilateral 40-59% carotid arterial disease Holter monitor showed one short run of atrial tachycardia/SVT 7 beats, rare APCs  She had a difficult year 05/23/2012 as her husband passed away last year from a reaction to blood transfusion. She has had some problems adjusting, insomnia, increasing her weight. She has a supportive family and is trying to stay active. For SVT, she has tried Cardizem in the past but had fatigue  Echocardiogram May 2006 showing mild LVH, mild MR, moderate TR, biventricular systolic pressure 46 mmHg with mild pulmonary hypertension  Perfusion stress test January 2006 showing normal ejection fraction, poor exercise tolerance on Bruce protocol for 3 minutes 30 seconds, or Apical septal ischemia  Significant family history with brother with bypass surgery, father with bypass surgery at age 42, mother with stroke and heart attack in her 1s   PMH:   has a past medical history of Diverticulosis, GERD (gastroesophageal reflux disease), Hypercalcemia, Hypercholesterolemia, Hypertension, Pneumonia, SVT (supraventricular tachycardia) ([place]), TIA (transient ischemic attack), TIA (transient ischemic attack), UTI (urinary tract infection), and Vertigo.  PSH:    Past Surgical History:  Procedure Laterality Date  . APPENDECTOMY  1954  . BREAST CYST ASPIRATION Right    neg  . CARDIAC CATHETERIZATION  [id number]  . CATARACT EXTRACTION W/PHACO Right 05/26/2017   Procedure: CATARACT EXTRACTION PHACO AND INTRAOCULAR LENS PLACEMENT ([place]) RIGHT;  Surgeon: Leandrew Koyanagi, MD;  Location: [facility];  Service: Ophthalmology;  Laterality: Right;  . CATARACT EXTRACTION W/PHACO Left 08/18/2017   Procedure: CATARACT EXTRACTION PHACO AND INTRAOCULAR LENS PLACEMENT ([facility]) LEFT;  Surgeon:  Leandrew Koyanagi, MD;  Location: [facility];  Service: Ophthalmology;  Laterality: Left;  . CHOLECYSTECTOMY  1981    Current Outpatient Medications  Medication Sig Dispense Refill  .  Ascorbic Acid (VITAMIN C) 1000 MG tablet Take 1,000 mg by mouth daily. am    . clopidogrel (PLAVIX) 75 MG tablet TAKE 1 TABLET BY MOUTH  EVERY DAY WITH BREAKFAST 90 tablet 1  . Cranberry 500 MG CAPS Take by mouth. am    . cyclobenzaprine (FLEXERIL) 10 MG tablet Take 0.5-1 tablets (5-10 mg total) by mouth at bedtime as needed for muscle spasms. 90 tablet 1  . ezetimibe (ZETIA) 10 MG tablet TAKE 1 TABLET BY MOUTH  DAILY 90 tablet 3  . fluticasone (FLONASE) 50 MCG/ACT nasal spray Place 1 spray into both nostrils 2 (two) times daily. 1 g 0  . ibandronate (BONIVA) 150 MG tablet TAKE 1 TABLET BY MOUTH ONCE MONTHLY WITH FULL GLASS WATER ON AN EMPTY STOMACH, NO FOOD, DRINK OR MEDICATION 1 tablet 11  . ibuprofen (ADVIL,MOTRIN) 200 MG tablet Take 200 mg by mouth every 6 (six) hours as needed.    . meclizine (ANTIVERT) 25 MG tablet Take 1 tablet (25 mg total) by mouth 3 (three) times daily as needed for dizziness. 60 tablet 0  . metoprolol succinate (TOPROL-XL) 50 MG 24 hr tablet Take 1 tablet (50 mg total) by mouth daily. 90 tablet 3  . montelukast (SINGULAIR) 10 MG tablet TAKE 1 TABLET BY MOUTH AT  BEDTIME 90 tablet 1  . ondansetron (ZOFRAN ODT) 4 MG disintegrating tablet Take 1 tablet (4 mg total) by mouth every 6 (six) hours as needed for nausea or vomiting. 20 tablet 0  . pantoprazole (PROTONIX) 40 MG tablet TAKE 1 TABLET BY MOUTH  DAILY 90 tablet 1  . traZODone (DESYREL) 50 MG tablet Take 0.5-1 tablets (25-50 mg total) by mouth at bedtime as needed for sleep. 30 tablet 3   No current facility-administered medications for this visit.      Allergies:   Fish oil and Simvastatin   Social History:  The patient  reports that she has never smoked. She has never used smokeless tobacco. She reports that she does not drink alcohol or use drugs.   Family History:   family history includes Breast cancer (age of onset: 26) in her maternal aunt; Dementia in her father; Heart disease in her brother, father, and  mother; Stroke in an other family member.    Review of Systems: Review of Systems  Constitutional: Negative.   HENT: Negative.   Respiratory: Negative.   Cardiovascular: Negative.        Arm pain  Gastrointestinal: Negative.   Musculoskeletal: Negative.   Neurological: Negative.   Psychiatric/Behavioral: Negative.   All other systems reviewed and are negative.    PHYSICAL EXAM: VS:  BP 132/62 (BP Location: Left Arm, Patient Position: Sitting, Cuff Size: Normal)   Pulse 61   Ht 5' 2.5" (1.588 m)   Wt 153 lb (69.4 kg)   BMI 27.54 kg/m  , BMI Body mass index is 27.54 kg/m. Constitutional:  oriented to person, place, and time. No distress.  HENT:  Head: Grossly normal Eyes:  no discharge. No scleral icterus.  Neck: No JVD, no carotid bruits  Cardiovascular: Regular rate and rhythm, no murmurs appreciated Pulmonary/Chest: Clear to auscultation bilaterally, no wheezes or rails Abdominal: Soft.  no distension.  no tenderness.  Musculoskeletal: Normal range of motion Neurological:  normal muscle tone. Coordination normal. No atrophy  Skin: Skin warm and dry Psychiatric: normal affect, pleasant   Recent Labs: No results found for requested labs within last 8760 hours.    Lipid Panel Lab Results  Component Value Date   CHOL 184 12/17/2017   HDL 42 12/17/2017   LDLCALC 102 (H) 12/17/2017   TRIG 198 (H) 12/17/2017      Wt Readings from Last 3 Encounters:  12/20/18 153 lb (69.4 kg)  04/08/18 144 lb (65.3 kg)  02/12/18 148 lb (67.1 kg)      ASSESSMENT AND PLAN:  Hypertension, essential, benign -  Stressed importance of walking for weight loss, dietary changes Otherwise blood pressure well controlled  SVT (supraventricular tachycardia) (HCC) -  Asymptomatic, no changes to her medications  H/O transient cerebral ischemia -  No recent neurologic type events concerning for TIA or stroke On Plavix  Carotid artery disease, unspecified laterality ([place]) -  Most  recent scan April 2018 reviewed with her in detail showing less than 39% disease bilaterally Stressed importance of getting back on her statin Even on lovastatin numbers were not at goal We will start Crestor 20 with her Zetia  Mixed hyperlipidemia Stressed importance as above getting her cholesterol down Crestor 20 with Zetia LFTs reviewed, recommended weight loss   Nonalcoholic steatohepatitis ([facility]) Secondary to obesity, recommended weight loss, low carbohydrate diet Dietary changes discussed with her    Total encounter time more than 25 minutes  Greater than 50% was spent in counseling and coordination of care with the patient   Disposition:   F/U  12 months   Orders Placed This Encounter  Procedures  . EKG 12-Lead     Signed, Esmond Plants, M.D., Ph.D. 12/20/2018  [place], [place]

## 2018-12-20 ENCOUNTER — Encounter: Payer: Medicare Other | Admitting: Family Medicine

## 2018-12-20 ENCOUNTER — Ambulatory Visit: Payer: Medicare Other

## 2018-12-20 ENCOUNTER — Other Ambulatory Visit: Payer: Self-pay

## 2018-12-20 ENCOUNTER — Encounter: Payer: Self-pay | Admitting: Cardiovascular Disease

## 2018-12-20 ENCOUNTER — Ambulatory Visit: Payer: Medicare Other | Admitting: Cardiovascular Disease

## 2018-12-20 VITALS — BP 132/62 | HR 61 | Ht 62.5 in | Wt 153.0 lb

## 2018-12-20 DIAGNOSIS — I471 Supraventricular tachycardia, unspecified: Secondary | ICD-10-CM

## 2018-12-20 DIAGNOSIS — E782 Mixed hyperlipidemia: Secondary | ICD-10-CM | POA: Diagnosis not present

## 2018-12-20 DIAGNOSIS — I10 Essential (primary) hypertension: Secondary | ICD-10-CM

## 2018-12-20 DIAGNOSIS — Z8673 Personal history of transient ischemic attack (TIA), and cerebral infarction without residual deficits: Secondary | ICD-10-CM

## 2018-12-20 DIAGNOSIS — I779 Disorder of arteries and arterioles, unspecified: Secondary | ICD-10-CM | POA: Diagnosis not present

## 2018-12-20 DIAGNOSIS — I739 Peripheral vascular disease, unspecified: Secondary | ICD-10-CM

## 2018-12-20 DIAGNOSIS — K7581 Nonalcoholic steatohepatitis (NASH): Secondary | ICD-10-CM

## 2018-12-20 MED ORDER — ROSUVASTATIN CALCIUM 20 MG PO TABS: 20.0000 mg | ORAL_TABLET | Freq: Every day | ORAL | 11 refills | Status: DC

## 2018-12-20 MED ORDER — ROSUVASTATIN CALCIUM 20 MG PO TABS: 20.0000 mg | ORAL_TABLET | Freq: Every day | ORAL | 0 refills | Status: DC

## 2018-12-20 MED ORDER — ROSUVASTATIN CALCIUM 20 MG PO TABS: 20.0000 mg | ORAL_TABLET | Freq: Every day | ORAL | 3 refills | Status: DC

## 2018-12-20 NOTE — Patient Instructions (Addendum)
Medication Instructions:  Please hold  The lovaststin, Start crestor 20 mg daily  If you need a refill on your cardiac medications before your next appointment, please call your pharmacy.    Lab work: No new labs needed   If you have labs (blood work) drawn today and your tests are completely normal, you will receive your results only by: Marland Kitchen MyChart Message (if you have MyChart) OR . A paper copy in the mail If you have any lab test that is abnormal or we need to change your treatment, we will call you to review the results.   Testing/Procedures: No new testing needed   Follow-Up: At [facility], you and your health needs are our priority.  As part of our continuing mission to provide you with exceptional heart care, we have created designated Provider Care Teams.  These Care Teams include your primary Cardiologist (physician) and Advanced Practice Providers (APPs -  Physician Assistants and Nurse Practitioners) who all work together to provide you with the care you need, when you need it.  . You will need a follow up appointment in 12 months .   Please call our office 2 months in advance to schedule this appointment.    . Providers on your designated Care Team:   . Murray Hodgkins, NP . Christell Faith, PA-C . Marrianne Mood, PA-C  Any Other Special Instructions Will Be Listed Below (If Applicable).  For educational health videos Log in to : [email or web address] Or : [email or web address], password : [email or web address]

## 2019-01-20 ENCOUNTER — Other Ambulatory Visit: Payer: Self-pay | Admitting: Family Medicine

## 2019-01-20 ENCOUNTER — Other Ambulatory Visit: Payer: Self-pay | Admitting: Cardiovascular Disease

## 2019-02-13 DIAGNOSIS — J45909 Unspecified asthma, uncomplicated: Secondary | ICD-10-CM | POA: Diagnosis not present

## 2019-02-13 DIAGNOSIS — E785 Hyperlipidemia, unspecified: Secondary | ICD-10-CM | POA: Diagnosis not present

## 2019-02-13 DIAGNOSIS — M1990 Unspecified osteoarthritis, unspecified site: Secondary | ICD-10-CM | POA: Diagnosis not present

## 2019-02-13 DIAGNOSIS — I471 Supraventricular tachycardia: Secondary | ICD-10-CM | POA: Diagnosis not present

## 2019-02-26 ENCOUNTER — Other Ambulatory Visit: Payer: Self-pay | Admitting: Family Medicine

## 2019-02-27 NOTE — Telephone Encounter (Signed)
Requested medication (s) are due for refill today: yes  Requested medication (s) are on the active medication list:yes  Last refill: 12/02/2018  Future visit scheduled: no  Notes to clinic: no valid encounter within last 6 months   Requested Prescriptions  Pending Prescriptions Disp Refills   clopidogrel (PLAVIX) 75 MG tablet [Pharmacy Med Name: CLOPIDOGREL  75MG  TAB] 90 tablet 3    Sig: TAKE 1 TABLET BY [place]      Hematology: Antiplatelets - clopidogrel Failed - 02/26/2019  6:50 PM      Failed - Evaluate AST, ALT within 2 months of therapy initiation.      Failed - ALT in normal range and within 360 days    ALT  Date Value Ref Range Status  12/17/2017 20 0 - 32 IU/L Final  06/28/2013 23  Final          Failed - AST in normal range and within 360 days    AST  Date Value Ref Range Status  12/17/2017 34 0 - 40 IU/L Final  06/28/2013 25 U/L Final          Failed - HCT in normal range and within 180 days    Hematocrit  Date Value Ref Range Status  12/17/2017 40.7 34.0 - 46.6 % Final          Failed - HGB in normal range and within 180 days    Hemoglobin  Date Value Ref Range Status  12/17/2017 13.2 11.1 - 15.9 g/dL Final   HGB  Date Value Ref Range Status  10/03/2013 13.8 g/dL Final          Failed - PLT in normal range and within 180 days    Platelets  Date Value Ref Range Status  12/17/2017 183 150 - 450 x10E3/uL Final          Failed - Valid encounter within last 6 months    Recent Outpatient Visits           10 months ago [place], Wendee Beavers, PA-C   1 year ago Encounter for annual physical exam   [facility] [place], Dionne Bucy, MD   1 year ago Left ear pain   [facility] Car[place] M, [place]   1 year ago Benign essential HTN   [facility] [place], Dionne Bucy, MD   1 year ago Benign essential HTN   [facility] [place],  Dionne Bucy, MD                pantoprazole (PROTONIX) 40 MG tablet [Pharmacy Med Name: PANTOPRAZOLE SOD 40MG EC TABLET] 90 tablet 3    Sig: TAKE 1 TABLET BY MOUTH  DAILY      Gastroenterology: Proton Pump Inhibitors Passed - 02/26/2019  6:50 PM      Passed - Valid encounter within last 12 months    Recent Outpatient Visits           10 months ago [place], Wendee Beavers, PA-C   1 year ago Encounter for annual physical exam   [facility] [place], Dionne Bucy, MD   1 year ago Left ear pain   [facility] Carles Collet M, [place]   1 year ago Benign essential HTN   [facility] [place], Dionne Bucy, MD   1 year ago Benign essential HTN   [facility] [name],  MD

## 2019-03-20 ENCOUNTER — Other Ambulatory Visit: Payer: Self-pay

## 2019-03-20 ENCOUNTER — Ambulatory Visit
Admission: EM | Admit: 2019-03-20 | Discharge: 2019-03-20 | Disposition: A | Discharge status: Home / Self Care | Payer: Medicare Other | Attending: Emergency Medicine | Admitting: Emergency Medicine

## 2019-03-20 DIAGNOSIS — R7303 Prediabetes: Secondary | ICD-10-CM | POA: Insufficient documentation

## 2019-03-20 DIAGNOSIS — Z8673 Personal history of transient ischemic attack (TIA), and cerebral infarction without residual deficits: Secondary | ICD-10-CM | POA: Diagnosis not present

## 2019-03-20 DIAGNOSIS — Y9389 Activity, other specified: Secondary | ICD-10-CM | POA: Diagnosis not present

## 2019-03-20 DIAGNOSIS — Z20822 Contact with and (suspected) exposure to covid-19: Secondary | ICD-10-CM | POA: Diagnosis not present

## 2019-03-20 DIAGNOSIS — R519 Headache, unspecified: Secondary | ICD-10-CM | POA: Insufficient documentation

## 2019-03-20 DIAGNOSIS — M810 Age-related osteoporosis without current pathological fracture: Secondary | ICD-10-CM | POA: Diagnosis not present

## 2019-03-20 DIAGNOSIS — K219 Gastro-esophageal reflux disease without esophagitis: Secondary | ICD-10-CM | POA: Insufficient documentation

## 2019-03-20 DIAGNOSIS — Y929 Unspecified place or not applicable: Secondary | ICD-10-CM | POA: Insufficient documentation

## 2019-03-20 DIAGNOSIS — W19XXXA Unspecified fall, initial encounter: Secondary | ICD-10-CM | POA: Insufficient documentation

## 2019-03-20 DIAGNOSIS — D649 Anemia, unspecified: Secondary | ICD-10-CM | POA: Insufficient documentation

## 2019-03-20 DIAGNOSIS — M5135 Other intervertebral disc degeneration, thoracolumbar region: Secondary | ICD-10-CM | POA: Diagnosis not present

## 2019-03-20 DIAGNOSIS — Z7901 Long term (current) use of anticoagulants: Secondary | ICD-10-CM | POA: Diagnosis not present

## 2019-03-20 DIAGNOSIS — E7800 Pure hypercholesterolemia, unspecified: Secondary | ICD-10-CM | POA: Diagnosis not present

## 2019-03-20 DIAGNOSIS — I10 Essential (primary) hypertension: Secondary | ICD-10-CM | POA: Diagnosis not present

## 2019-03-20 DIAGNOSIS — Z79899 Other long term (current) drug therapy: Secondary | ICD-10-CM | POA: Insufficient documentation

## 2019-03-20 DIAGNOSIS — Z7902 Long term (current) use of antithrombotics/antiplatelets: Secondary | ICD-10-CM | POA: Diagnosis not present

## 2019-03-20 DIAGNOSIS — E785 Hyperlipidemia, unspecified: Secondary | ICD-10-CM | POA: Diagnosis not present

## 2019-03-20 NOTE — ED Triage Notes (Addendum)
Pt reports she fell before Christmas and hit the back of her head and has been having intermittent headaches since then. Last night had pain on top of her head and her scalp felt like it was burning. Today she feels sinus pressure in her face and some nasal drainage.

## 2019-03-20 NOTE — ED Provider Notes (Signed)
[facility] ____________________________________________  Time seen: Approximately 11:32 AM  I have reviewed the triage vital signs and the nursing notes.   HISTORY  Chief Complaint Headache   HPI Jade Gardner is a 74 y.o. female past medical history of hypertension, hyperlipidemia, TIA presenting for evaluation of headache.  Patient reports that just prior to Christmas she was bending down to pick up something in the cabinet she was leaning on shifted causing her to fall backwards and hit her head.  States since then she has had intermittent headaches.  States that she had a headache almost daily for a few hours for the first week after that, and reports she has had sporadic headaches since but not daily or lasting.  Denies any abrupt onsets.  States however since yesterday she has had a continued headache.  States headache is frontal and going down the back of her head and described as just a pain that is 6-7 out of 10 at this time.  States the headache was gradual in onset.  States she does not typically have headaches.  She is still on Plavix.  States the only comparison to the current headache is her previous TIAs and, stating this feels like her previous TIA headaches.  Has not been seen for the same complaints.  Did have some nasal congestion in the last 2 days, but reports not copious.  Denies fevers, cough, chest pain or shortness of breath, paresthesias, vision changes, syncope, near syncope, dizziness, confusion or unsteady gait.  Has had some accompanying nausea, no vomiting in the last day.  States she took Advil yesterday and today and her headache continues to worsen rather than improve.  PCP: [facility]   Past Medical History:  Diagnosis Date  . Diverticulosis   . GERD (gastroesophageal reflux disease)   . Hypercalcemia    Secondar to calcium supplements. Normal PTH  . Hypercholesterolemia   . Hypertension    controlled on meds  . Pneumonia    in past  . SVT  (supraventricular tachycardia) (HCC)    history of  . TIA (transient ischemic attack)    x2  . TIA (transient ischemic attack)    x2  . UTI (urinary tract infection)   . Vertigo     Patient Active Problem List   Diagnosis Date Noted  . Elevated TSH 05/18/2017  . Leg cramps 05/18/2017  . Hyperlipidemia 11/28/2015  . H/O transient cerebral ischemia 05/29/2015  . Personal history of other diseases of the musculoskeletal system and connective tissue 05/29/2015  . Bronchitis, mucopurulent recurrent ([place]) 05/29/2015  . Diverticulosis 08/24/2014  . Fibrocystic breast disease 08/24/2014  . Anemia 08/23/2014  . Allergic rhinitis 08/23/2014  . DDD (degenerative disc disease), thoracolumbar 08/23/2014  . Hearing loss 08/23/2014  . Insomnia 08/23/2014  . Osteoporosis 08/23/2014  . Overweight (BMI 25.0-29.9) 08/23/2014  . Pre-diabetes 08/23/2014  . Obstructive sleep apnea 08/23/2014  . Varicose veins of both lower extremities without ulcer or inflammation 08/23/2014  . B12 deficiency 08/23/2014  . Vitamin D deficiency 08/23/2014  . Carotid stenosis 09/15/2013  . SVT (supraventricular tachycardia) ([place]) 05/26/2011  . Nonalcoholic steatohepatitis (NA[facility]) 05/20/2009  . Diaphragmatic hernia 08/10/2008  . Cardiac conduction disorder 08/10/2008  . Benign essential HTN 08/10/2008  . GERD (gastroesophageal reflux disease) 08/10/2008    Past Surgical History:  Procedure Laterality Date  . APPENDECTOMY  1954  . BREAST CYST ASPIRATION Right    neg  . CARDIAC CATHETERIZATION  [id number]  . CATARACT EXTRACTION W/PHACO Right  05/26/2017   Procedure: CATARACT EXTRACTION PHACO AND INTRAOCULAR LENS PLACEMENT ([place]) RIGHT;  Surgeon: Leandrew Koyanagi, MD;  Location: [place];  Service: Ophthalmology;  Laterality: Right;  . CATARACT EXTRACTION W/PHACO Left 08/18/2017   Procedure: CATARACT EXTRACTION PHACO AND INTRAOCULAR LENS PLACEMENT ([place]) LEFT;  Surgeon: Leandrew Koyanagi, MD;   Location: [place];  Service: Ophthalmology;  Laterality: Left;  . CHOLECYSTECTOMY  1981     No current facility-administered medications for this encounter.  Current Outpatient Medications:  .  Ascorbic Acid (VITAMIN C) 1000 MG tablet, Take 1,000 mg by mouth daily. am, Disp: , Rfl:  .  clopidogrel (PLAVIX) 75 MG tablet, TAKE 1 TABLET BY MOUTH  EVERY DAY WITH BREAKFAST, Disp: 90 tablet, Rfl: 1 .  Cranberry 500 MG CAPS, Take by mouth. am, Disp: , Rfl:  .  cyclobenzaprine (FLEXERIL) 10 MG tablet, Take 0.5-1 tablets (5-10 mg total) by mouth at bedtime as needed for muscle spasms., Disp: 90 tablet, Rfl: 1 .  ezetimibe (ZETIA) 10 MG tablet, TAKE 1 TABLET BY MOUTH  DAILY, Disp: 90 tablet, Rfl: 3 .  fluticasone (FLONASE) 50 MCG/ACT nasal spray, Place 1 spray into both nostrils 2 (two) times daily., Disp: 1 g, Rfl: 0 .  ibandronate (BONIVA) 150 MG tablet, TAKE 1 TABLET BY MOUTH ONCE MONTHLY WITH FULL GLASS WATER ON AN EMPTY STOMACH, NO FOOD, DRINK OR MEDICATION, Disp: 1 tablet, Rfl: 11 .  ibuprofen (ADVIL,MOTRIN) 200 MG tablet, Take 200 mg by mouth every 6 (six) hours as needed., Disp: , Rfl:  .  meclizine (ANTIVERT) 25 MG tablet, Take 1 tablet (25 mg total) by mouth 3 (three) times daily as needed for dizziness., Disp: 60 tablet, Rfl: 0 .  metoprolol succinate (TOPROL-XL) 50 MG 24 hr tablet, TAKE 1 TABLET BY MOUTH  DAILY, Disp: 90 tablet, Rfl: 3 .  montelukast (SINGULAIR) 10 MG tablet, TAKE 1 TABLET BY MOUTH AT  BEDTIME, Disp: 90 tablet, Rfl: 1 .  ondansetron (ZOFRAN ODT) 4 MG disintegrating tablet, Take 1 tablet (4 mg total) by mouth every 6 (six) hours as needed for nausea or vomiting., Disp: 20 tablet, Rfl: 0 .  pantoprazole (PROTONIX) 40 MG tablet, TAKE 1 TABLET BY MOUTH  DAILY, Disp: 90 tablet, Rfl: 1 .  rosuvastatin (CRESTOR) 20 MG tablet, Take 1 tablet (20 mg total) by mouth daily., Disp: 90 tablet, Rfl: 3 .  traZODone (DESYREL) 50 MG tablet, Take 0.5-1 tablets (25-50 mg total) by  mouth at bedtime as needed for sleep., Disp: 30 tablet, Rfl: 3  Allergies Fish oil and Simvastatin  Family History  Problem Relation Age of Onset  . Heart disease Father   . Dementia Father   . Heart disease Mother   . Heart disease Brother   . Stroke Other   . Breast cancer Maternal Aunt 88    Social History Social History   Tobacco Use  . Smoking status: Never Smoker  . Smokeless tobacco: Never Used  Substance Use Topics  . Alcohol use: No  . Drug use: No    Review of Systems Constitutional: No fever. Eyes: No visual changes. ENT: No sore throat. Cardiovascular: Denies chest pain. Respiratory: Denies shortness of breath. Gastrointestinal: No abdominal pain.  Positive nausea, no vomiting.  No diarrhea.  No constipation. Genitourinary: Negative for dysuria. Musculoskeletal: Negative for back pain. Skin: Negative for rash. Neurological: Positive for headaches.  Negative for focal weakness or numbness.    ____________________________________________   PHYSICAL EXAM:  VITAL SIGNS: ED Triage Vitals  Enc Vitals Group     BP 03/20/19 1016 137/79     Pulse Rate 03/20/19 1016 78     Resp 03/20/19 1016 16     Temp 03/20/19 1016 98.1 F (36.7 C)     Temp Source 03/20/19 1016 Oral     SpO2 03/20/19 1016 99 %     Weight 03/20/19 1020 153 lb (69.4 kg)     Height 03/20/19 1020 5' 2.5" (1.588 m)     Head Circumference --      Peak Flow --      Pain Score 03/20/19 1019 6     Pain Loc --      Pain Edu? --      Excl. in [place]? --     Constitutional: Alert and oriented. Well appearing and in no acute distress. Eyes: Conjunctivae are normal. PERRL. EOMI. ENT      Head: Normocephalic.  No ecchymosis or edema noted.      Nose: Mild nasal congestion.      Mouth/Throat: Mucous membranes are moist.Oropharynx non-erythematous. Neck: No stridor. Supple without meningismus.  Hematological/Lymphatic/Immunilogical: No cervical lymphadenopathy. Cardiovascular: Normal rate,  regular rhythm. Grossly normal heart sounds.  Good peripheral circulation. Respiratory: Normal respiratory effort without tachypnea nor retractions. Breath sounds are clear and equal bilaterally. No wheezes, rales, rhonchi. Musculoskeletal: Steady gait. Neurologic:  Normal speech and language. No gross focal neurologic deficits are appreciated. Speech is normal. No gait instability.  Negative pronator drift.  No ataxia.  No paresthesias. Skin:  Skin is warm, dry and intact. No rash noted. Psychiatric: Mood and affect are normal. Speech and behavior are normal. Patient exhibits appropriate insight and judgment   ___________________________________________   LABS (all labs ordered are listed, but only abnormal results are displayed)  Labs Reviewed  NOVEL CORONAVIRUS, NAA (HOSP ORDER, SEND-OUT TO REF LAB; TAT 18-24 HRS)   ____________________________________________   PROCEDURES Procedures    INITIAL IMPRESSION / ASSESSMENT AND PLAN / ED COURSE  Pertinent labs & imaging results that were available during my care of the patient were reviewed by me and considered in my medical decision making (see chart for details).  Presenting for evaluation of headache.  Patient reports since an injury she has been having intermittent headaches, reports in the last 2 days she has had persistent increasing headache.  No new injury.  Compares this to previous TIA headache and states feels similar.  Discussed multiple differentials with patient including viral, sinusitis, postconcussive versus intracranial abnormality or TIA.  At this time recommend further evaluation in the emergency room, patient agrees to this.  Patient calling her son to get her son to transport her directly to [facility].  Declined EMS transfer.  Alert and oriented with decisional capacity.  ____________________________________________   FINAL CLINICAL IMPRESSION(S) / ED DIAGNOSES  Final diagnoses:  Acute intractable headache,  unspecified headache type     ED Discharge Orders    None       Note: This dictation was prepared with Dragon dictation along with smaller phrase technology. Any transcriptional errors that result from this process are unintentional.         Marylene Land, NP 03/20/19 1137

## 2019-03-20 NOTE — Discharge Instructions (Addendum)
Go directly to emergency room.

## 2019-03-21 LAB — NOVEL CORONAVIRUS, NAA (HOSP ORDER, SEND-OUT TO REF LAB; TAT 18-24 HRS): SARS-CoV-2, NAA: NOT DETECTED

## 2019-04-19 ENCOUNTER — Other Ambulatory Visit: Payer: Self-pay | Admitting: Family Medicine

## 2019-04-19 NOTE — Telephone Encounter (Signed)
Notes to clinic:  Pt needs an office visit.    Requested Prescriptions  Pending Prescriptions Disp Refills   montelukast (SINGULAIR) 10 MG tablet [Pharmacy Med Name: MONTELUKAST 10MG TABLET] 90 tablet 3    Sig: TAKE 1 TABLET BY MOUTH AT  BEDTIME      Pulmonology:  Leukotriene Inhibitors Failed - 04/19/2019 10:08 PM      Failed - Valid encounter within last 12 months    Recent Outpatient Visits           1 year ago [place] Trinna Post, PA-C   1 year ago Encounter for annual physical exam   [facility] [place], Dionne Bucy, MD   1 year ago Left ear pain   [facility] Carles Collet M, [place]   1 year ago Benign essential HTN   [place], Dionne Bucy, MD   1 year ago Benign essential HTN   [facility] [place], Dionne Bucy, MD

## 2019-05-08 ENCOUNTER — Other Ambulatory Visit: Payer: Self-pay | Admitting: Family Medicine

## 2019-05-08 NOTE — Telephone Encounter (Signed)
[facility] faxed refill request for the following medications:  montelukast (SINGULAIR) 10 MG tablet   Please advise.  Thanks, [employer]

## 2019-09-22 ENCOUNTER — Telehealth: Payer: Self-pay | Admitting: Cardiovascular Disease

## 2019-09-22 NOTE — Telephone Encounter (Signed)
Primary Cardiologist:Timothy Rockey Situ, MD  Chart reviewed as part of pre-operative protocol coverage. Because of Jade Gardner's past medical history and time since last visit, he/she will require a follow-up visit in order to better assess preoperative cardiovascular risk.  Pre-op covering staff: - Please schedule appointment and call patient to inform them. - Please contact requesting surgeon's office via preferred method (i.e, phone, fax) to inform them of need for appointment prior to surgery.  If applicable, this message will also be routed to pharmacy pool and/or primary cardiologist for input on holding anticoagulant/antiplatelet agent as requested below so that this information is available at time of patient's appointment.   Deberah Pelton, NP  09/22/2019, 10:18 AM

## 2019-09-22 NOTE — Telephone Encounter (Signed)
   [facility] Pre-operative Risk Assessment    HEARTCARE STAFF: - Please ensure there is not already an duplicate clearance open for this procedure. - Under Visit Info/Reason for Call, type in Other and utilize the format Clearance MM/DD/YY or Clearance TBD. Do not use dashes or single digits. - If request is for dental extraction, please clarify the # of teeth to be extracted.  Request for surgical clearance:  1. What type of surgery is being performed? EGD for variceal screening  2. When is this surgery scheduled? TBD   3. What type of clearance is required (medical clearance vs. Pharmacy clearance to hold med vs. Both)? both  4. Are there any medications that need to be held prior to surgery and how long? Plavix 5 days before procedure  5. Practice name and name of physician performing surgery? [facility]  6. What is the office phone number? [phone]   7.   What is the office fax number? [phone]  8.   Anesthesia type (None, local, MAC, general) ? Not listed   Elissa Hefty 09/22/2019, 9:45 AM  _________________________________________________________________   (provider comments below)

## 2019-09-22 NOTE — Telephone Encounter (Signed)
LM2CB-needs appt for clearance

## 2019-09-25 NOTE — Telephone Encounter (Signed)
Tried calling the requesting office the number given was for a  voicemail for Shelly Flatten, RN could not leave a voice message due to it being full was automically transferred to the hospital main line then transferred back to GI left a detailed voice message for the triage nurse line to have someone give me a call back to give updated information.

## 2019-09-25 NOTE — Telephone Encounter (Signed)
Called and spoke with patient explained why I was calling. Patient is scheduled to see Marrianne Mood on 10/09/19. Patient verbalized understanding and all (if any) questions were answered. I also called and updated the requesting office of appointment.

## 2019-10-09 ENCOUNTER — Ambulatory Visit: Payer: Medicare Other | Admitting: Physician Assistant

## 2019-10-09 ENCOUNTER — Encounter: Payer: Self-pay | Admitting: Physician Assistant

## 2019-10-09 ENCOUNTER — Other Ambulatory Visit: Payer: Self-pay

## 2019-10-09 VITALS — BP 128/68 | HR 61 | Ht 62.5 in | Wt 151.6 lb

## 2019-10-09 DIAGNOSIS — Z01810 Encounter for preprocedural cardiovascular examination: Secondary | ICD-10-CM

## 2019-10-09 DIAGNOSIS — Z8673 Personal history of transient ischemic attack (TIA), and cerebral infarction without residual deficits: Secondary | ICD-10-CM

## 2019-10-09 DIAGNOSIS — I779 Disorder of arteries and arterioles, unspecified: Secondary | ICD-10-CM | POA: Diagnosis not present

## 2019-10-09 DIAGNOSIS — I471 Supraventricular tachycardia: Secondary | ICD-10-CM

## 2019-10-09 DIAGNOSIS — I10 Essential (primary) hypertension: Secondary | ICD-10-CM

## 2019-10-09 DIAGNOSIS — E782 Mixed hyperlipidemia: Secondary | ICD-10-CM | POA: Diagnosis not present

## 2019-10-09 DIAGNOSIS — E785 Hyperlipidemia, unspecified: Secondary | ICD-10-CM | POA: Diagnosis not present

## 2019-10-09 DIAGNOSIS — K7581 Nonalcoholic steatohepatitis (NASH): Secondary | ICD-10-CM

## 2019-10-09 DIAGNOSIS — Z789 Other specified health status: Secondary | ICD-10-CM

## 2019-10-09 DIAGNOSIS — I739 Peripheral vascular disease, unspecified: Secondary | ICD-10-CM

## 2019-10-09 NOTE — Progress Notes (Signed)
Office Visit    Patient Name: Jade Gardner Date of Encounter: 10/09/2019  Primary Care Provider:  Danae Orleans, MD Primary Cardiologist:  Jade Rogue, MD  Chief Complaint    Chief Complaint  Patient presents with  . OTHER    Pre-procedural clearance for endoscopy. Medications verbally reviewed with patient.     74 yo female with history of SVT, TIA 2015 of unclear etiology, HTN, HLD, intolerance to simvastatin/Crestor/Lipitor, PAD, bilateral carotid artery dz, NASH, and here today for cardiac evaluation prior to endoscopy.  Past Medical History    Past Medical History:  Diagnosis Date  . Diverticulosis   . GERD (gastroesophageal reflux disease)   . Hypercalcemia    Secondar to calcium supplements. Normal PTH  . Hypercholesterolemia   . Hypertension    controlled on meds  . Pneumonia    in past  . SVT (supraventricular tachycardia) (HCC)    history of  . TIA (transient ischemic attack)    x2  . TIA (transient ischemic attack)    x2  . UTI (urinary tract infection)   . Vertigo    Past Surgical History:  Procedure Laterality Date  . APPENDECTOMY  1954  . BREAST CYST ASPIRATION Right    neg  . CARDIAC CATHETERIZATION  [id number]  . CATARACT EXTRACTION W/PHACO Right 05/26/2017   Procedure: CATARACT EXTRACTION PHACO AND INTRAOCULAR LENS PLACEMENT ([place]) RIGHT;  Surgeon: Jade Koyanagi, MD;  Location: [place];  Service: Ophthalmology;  Laterality: Right;  . CATARACT EXTRACTION W/PHACO Left 08/18/2017   Procedure: CATARACT EXTRACTION PHACO AND INTRAOCULAR LENS PLACEMENT ([place]) LEFT;  Surgeon: Jade Koyanagi, MD;  Location: [place];  Service: Ophthalmology;  Laterality: Left;  . CHOLECYSTECTOMY  1981    Allergies  Allergies  Allergen Reactions  . Fish Oil Itching    Burning and stinging  . Simvastatin     Muscle Pain, Joint pain    History of Present Illness    [name] is a 74 y.o. female with PMH as  above. She has significant family history of cardiac dz, including a brother with bypass surgery, father with bypass at 64 yo, and mother with stroke and MI in her 4s.  She reportedly had a difficult year in 2014, as her husband passed after a reaction to blood transfusion. At past clinic visits, she has noted problems with insomnia and weight in reaction to this but also a supportive therapy.  She has a history of SVT. She has tried Cardizem in the past but reported fatigue on this medicaiton.   She has a history of previous TIA 03/24/2013 with acute onset of slurred speech, HA, dizziness, blurred vision, and numbness of her head. She reportedly went to the ED with BP 200/100. She stayed the day and was discharged home. CTA / MRI was without findings of acute stroke. It was noted TIA occurred while on low dose ASA.    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 She was walking 2 miles daily though noted to be less active recently. It was noted her LFTs were discussed that visit. She was off of lovastatin and per primary cardiologist for reasons unclear. BP was noted to be well controlled. She was asx with her SVT. She denied any recent neurologic sx concerning for repeat TIA or stroke and remained on Plavix. Her most recent carotid artery scan was reviewed with recommendation for  her to restart her statin. Lifestyle changes, including weight loss, were discussed.  Today, she presents to clinic and reports she is doing well from a cardiac perspective. She denies chest pain, palpitations, dyspnea, pnd, orthopnea, n, v, dizziness, syncope, edema, weight gain, or early satiety. She is no longer taking Crestor, which she reports she has not taken for some time now due to achy joints. She reports that she recently started intermittent fasting approximately 6 weeks ago and is interested to see if this has improved her  LDL with request for repeat labs before progressing to PCSK9i as outlined below. She is still taking her Zetia. She is no longer taking ibuprofen. She reports compliance with her Plavix s/p TIA and denies any s/sx of bleeding. It was reviewed that recommendations as documented in her chart were for her to hold this medication 5 days before her procedure. She denies any recent neurologic sx. She denies any sx of SVT and continues on her BB. She reports that she is not sleeping well. She has issues both falling and staying asleep. She reports she usually falls asleep around 11PM and then is sometimes up at 4AM. She used to take ibuprofen for sleep but has since stopped this per recommendation of her physicians. She does note some improvement in her sleep since losing weight. She is currently taking Tylenol to help with her sleep though does not feel much improvement. She reports trying several OTC medications without relief. She was prescribed trazodone by her PCP for sleep but reports that she never got this medication with recommendation to contact her PCP regarding any renawal of this rx. She reports poor activity and an increased sedentary lifestyle. She has been keeping her grandson and is eager for him to go back to school on 8/23. She is hopeful to increase activity at that time, as she used to walk two miles. She reports completing her ADLs without difficulty. She is able to shop without sx concerning for angina and walk a block without CP or SOB. No s/sx of bleeding.  Home Medications    Prior to Admission medications   Medication Sig Start Date End Date Taking? Authorizing Provider  Ascorbic Acid (VITAMIN C) 1000 MG tablet Take 1,000 mg by mouth daily. am   Yes [provider]  clopidogrel (PLAVIX) 75 MG tablet TAKE 1 TABLET BY MOUTH  EVERY DAY WITH BREAKFAST 07/27/18  Yes Bacigalupo, Dionne Bucy, MD  Cranberry 500 MG CAPS Take by mouth. am   Yes [provider]  cyclobenzaprine  (FLEXERIL) 10 MG tablet Take 0.5-1 tablets (5-10 mg total) by mouth at bedtime as needed for muscle spasms. 06/26/16  Yes Chrismon, Vickki Muff, PA  ezetimibe (ZETIA) 10 MG tablet TAKE 1 TABLET BY MOUTH  DAILY 05/06/18  Yes Gollan, Kathlene November, MD  fluticasone (FLONASE) 50 MCG/ACT nasal spray Place 1 spray into both nostrils 2 (two) times daily. 02/12/18  Yes Laurene Footman B, PA-C  ibandronate (BONIVA) 150 MG tablet TAKE 1 TABLET BY MOUTH ONCE MONTHLY WITH FULL GLASS WATER ON AN EMPTY STOMACH, NO FOOD, DRINK OR MEDICATION 09/23/17  Yes Bacigalupo, Dionne Bucy, MD  meclizine (ANTIVERT) 25 MG tablet Take 1 tablet (25 mg total) by mouth 3 (three) times daily as needed for dizziness. 07/20/17  Yes Bacigalupo, Dionne Bucy, MD  metoprolol succinate (TOPROL-XL) 50 MG 24 hr tablet TAKE 1 TABLET BY MOUTH  DAILY 01/20/19  Yes Gollan, Kathlene November, MD  montelukast (SINGULAIR) 10 MG tablet Take 1 tablet (10  mg total) by mouth at bedtime. Please schedule office visit before any future refills 04/20/19  Yes Bacigalupo, Dionne Bucy, MD  omeprazole (PRILOSEC) 40 MG capsule Take by mouth. 09/27/19 09/26/20 Yes [provider]  ondansetron (ZOFRAN ODT) 4 MG disintegrating tablet Take 1 tablet (4 mg total) by mouth every 6 (six) hours as needed for nausea or vomiting. 07/08/15  Yes Delman Kitten, MD  vitamin E 180 MG (400 UNITS) capsule daily. 10/01/18  Yes [provider]    Review of Systems    She denies chest pain, palpitations, dyspnea, pnd, orthopnea, n, v, dizziness, syncope, edema, weight gain, or early satiety.  She reports insomnia and decreased activity. All other systems reviewed and are otherwise negative except as noted above.  Physical Exam    VS:  BP 128/68 (BP Location: Left Arm, Patient Position: Sitting, Cuff Size: Normal)   Pulse 61   Ht 5' 2.5" (1.588 m)   Wt 151 lb 9.6 oz (68.8 kg)   SpO2 97%   BMI 27.29 kg/m  , BMI Body mass index is 27.29 kg/m. GEN: Elderly female, in no acute distress. HEENT:  normal. Neck: Supple, no JVD, carotid bruits, or masses. Cardiac: RRR, No murmur. No, rubs, or gallops. No clubbing, cyanosis, edema.  Radials/DP/PT 2+ and equal bilaterally.  Respiratory:  Respirations regular and unlabored, clear to auscultation bilaterally. GI: Soft, nontender, nondistended, BS + x 4. MS: no deformity or atrophy. Skin: warm and dry, no rash. Neuro:  Strength and sensation are intact. Psych: Normal affect.  Accessory Clinical Findings    ECG personally reviewed by me today - NSR, 61bpm - no acute changes.  VITALS Reviewed today   Temp Readings from Last 3 Encounters:  03/20/19 98.1 F (36.7 C) (Oral)  04/08/18 99 F (37.2 C)  02/12/18 98 F (36.7 C) (Oral)   BP Readings from Last 3 Encounters:  10/09/19 128/68  03/20/19 137/79  12/20/18 132/62   Pulse Readings from Last 3 Encounters:  10/09/19 61  03/20/19 78  12/20/18 61    Wt Readings from Last 3 Encounters:  10/09/19 151 lb 9.6 oz (68.8 kg)  03/20/19 153 lb (69.4 kg)  12/20/18 153 lb (69.4 kg)     LABS  reviewed today   Lab Results  Component Value Date   WBC 4.9 12/17/2017   HGB 13.2 12/17/2017   HCT 40.7 12/17/2017   MCV 87 12/17/2017   PLT 183 12/17/2017   Lab Results  Component Value Date   CREATININE 0.83 12/17/2017   BUN 14 12/17/2017   NA 141 12/17/2017   K 4.1 12/17/2017   CL 106 12/17/2017   CO2 21 12/17/2017   Lab Results  Component Value Date   ALT 20 12/17/2017   AST 34 12/17/2017   ALKPHOS 56 12/17/2017   BILITOT 0.5 12/17/2017   Lab Results  Component Value Date   CHOL 184 12/17/2017   HDL 42 12/17/2017   LDLCALC 102 (H) 12/17/2017   TRIG 198 (H) 12/17/2017   CHOLHDL 4.4 12/17/2017    Lab Results  Component Value Date   HGBA1C 5.4 12/17/2017   Lab Results  Component Value Date   TSH 2.430 12/17/2017     STUDIES/PROCEDURES reviewed today   Echo 2015 - Left ventricle: The cavity size was normal. There was mild  concentric hypertrophy. Systolic  function was normal. The  estimated ejection fraction was in the range of 60% to  65%. Wall motion was normal; there were no regional wall  motion abnormalities. Left ventricular diastolic function  parameters were normal.  - Atrial septum: No defect or patent foramen ovale was  identified. There was no atrial level shunt. normal bubble  study.  - Tricuspid valve: Mild regurgitation.  - Pulmonary arteries: Systolic pressure was within the  normal range.   Assessment & Plan    Preoperative Cardiovascular Evaluation --No angina or s/sx concerning for ischemia. Euvolemic and well compensated. --No active cardiac conditions. Clinical risk factors include history of TIA.  --Functional capacity at least moderate / at least 4 METS reported. --Most recent echo as above in 2015 with EF nl and NRWMA. No indication for repeat echo at this time. --EKG without ischemic changes. No indication fo further ischemic workup at this time.  --Surgery specific risk low, given endoscopy procedure. --Reasonable to proceed with endoscopy without additional testing or intervention.  Recommend discontinuing clopidogrel 5-7 days prior to surgery and initiation of aspirin 81 mg daily at that time.  Reinitiation of clopidogrel (and discontinuation of aspirin) should be done as soon as it is felt safe to do so after surgery.  *Preoperative evaluation to be sent to [facility]. Office phone number [phone] Fax number [phone]  SVT --No racing HR or palpitations reported. No presyncope or syncope. Diltiazem resulted in fatigue in the past. Remains on metoprolol with sx well controlled. No medication changes at this time.    HTN --BP well controlled today. Recommendation for ongoing dietary and lifestyle changes discussed. Continue current medications.   HLD --Repeat LDL, per patient request and given recent intermittent fasting over the last 6 weeks. Willing to transition to PCSK9i if LDL still not  at goal. Discussed in detail today.  Carotid artery dz --Recommend HR/BP/LDL control. Recommend PCSK9i if LDL still not controlled at recheck as above. Denies any s/sx of worening dz today. Last imaging with mild bilateral dz. No indication for repeat imaging at this time.   H/o TIA --No sx concerning for repeat neurologic event. Bruising on ASA and Plavix; therefore, per primary cardiologist, ASA was discontinued at previous visits. She remains on Plavix 23m daily with recommendation to hold prior to endoscopy as above.   NASH --Pending endoscopy as above. --Continue dietary and lifestyle recommendations as previous discussed.    Medication changes: Pending LDL Labs ordered: Lipid, dLDL Studies / Imaging ordered: None Follow-up: 6 months, sooner if needed   JArvil Chaco PA-C 10/09/2019

## 2019-10-09 NOTE — Patient Instructions (Addendum)
Medication Instructions:  Your physician recommends that you continue on your current medications as directed. Please refer to the Current Medication list given to you today.  **Please hold Plavix 5 days prior to your surgery**  *If you need a refill on your cardiac medications before your next appointment, please call your pharmacy*   Lab Work: Your physician recommends that you have lab work today(lipid with direct LDL)  If you have labs (blood work) drawn today and your tests are completely normal, you will receive your results only by: Marland Kitchen MyChart Message (if you have MyChart) OR . A paper copy in the mail If you have any lab test that is abnormal or we need to change your treatment, we will call you to review the results.   Testing/Procedures: None ordered   Follow-Up: At [facility], you and your health needs are our priority.  As part of our continuing mission to provide you with exceptional heart care, we have created designated Provider Care Teams.  These Care Teams include your primary Cardiologist (physician) and Advanced Practice Providers (APPs -  Physician Assistants and Nurse Practitioners) who all work together to provide you with the care you need, when you need it.  We recommend signing up for the patient portal called "MyChart".  Sign up information is provided on this After Visit Summary.  MyChart is used to connect with patients for Virtual Visits (Telemedicine).  Patients are able to view lab/test results, encounter notes, upcoming appointments, etc.  Non-urgent messages can be sent to your provider as well.   To learn more about what you can do with MyChart, go to [email or web address].    Your next appointment:   Keep oct appt with Dr. Rockey Situ at this time. Will call for update after speaking with him.

## 2019-10-10 LAB — LIPID PANEL
Chol/HDL Ratio: 4.7 ratio — ABNORMAL HIGH (ref 0.0–4.4)
Cholesterol, Total: 212 mg/dL — ABNORMAL HIGH (ref 100–199)
HDL: 45 mg/dL (ref >39)
LDL Chol Calc (NIH): 128 mg/dL — ABNORMAL HIGH (ref 0–99)
Triglycerides: 222 mg/dL — ABNORMAL HIGH (ref 0–149)
VLDL Cholesterol Cal: 39 mg/dL (ref 5–40)

## 2019-10-10 LAB — LDL CHOLESTEROL, DIRECT: LDL Direct: 134 mg/dL — ABNORMAL HIGH (ref 0–99)

## 2019-10-12 ENCOUNTER — Telehealth: Payer: Self-pay | Admitting: *Deleted

## 2019-10-12 MED ORDER — REPATHA SURECLICK 140 MG/ML ~~LOC~~ SOAJ: 1.0000 "pen " | SUBCUTANEOUS | 11 refills | Status: DC

## 2019-10-12 NOTE — Telephone Encounter (Signed)
-----   Message from Arvil Chaco, PA-C sent at 10/11/2019  8:01 PM EDT ----- Please let Ms. Fanelli know that her LDL is still elevated at 134 (goal LDL <70) and total cholesterol above 200 at 212. We had discussed starting PCSK9i if this was the case. If she is agreeable, please refer her to pharmacy and start her on [place] (she wanted to speak with pharmacy for further information on PCSK9i during our clinic visit).

## 2019-10-12 NOTE — Telephone Encounter (Signed)
Results called to pt. Pt verbalized understanding. She is a little reluctant to start the [place]. She would like to discuss this with her pharmacist first. She is agreeable for me to go ahead and send in the prescription and she will let us know if she has any further questions or concerns.   She also asked if she needed the follow up with Dr Rockey Situ as discussed with [facility] yesterday. Routing to [place] to clarify. Can send MyChart message about whether October appointment is needed and when follow up is needed. THanks!

## 2019-10-13 ENCOUNTER — Telehealth: Payer: Self-pay

## 2019-10-13 NOTE — Telephone Encounter (Signed)
Fax received from [place] to initiate Prior Authorization for Repatha Sureclick 150CH/JS through [email or web address] KEY: [id number] Clinical questions answered. Waiting for determination.

## 2019-10-13 NOTE — Telephone Encounter (Signed)
Patient verbalized understanding of the recommendations. She would like to keep the appointment as scheduled in October.

## 2019-10-13 NOTE — Telephone Encounter (Signed)
Request Reference Number: [id number]. REPATHA SURE INJ 140MG/ML is approved through 04/14/2020. Pharmacy informed.

## 2019-10-13 NOTE — Telephone Encounter (Signed)
Thank you for the update.   I had recommended follow-up in 6 months, but she can make a sooner appointment if she would like to discuss Repatha again in clinic as well or if any new or concerning sx - is this the case?

## 2019-12-21 ENCOUNTER — Encounter: Payer: Self-pay | Admitting: Nurse Practitioner

## 2019-12-21 ENCOUNTER — Other Ambulatory Visit: Payer: Self-pay

## 2019-12-21 ENCOUNTER — Ambulatory Visit: Payer: Medicare Other | Admitting: Nurse Practitioner

## 2019-12-21 VITALS — BP 130/84 | HR 57 | Ht 62.5 in | Wt 149.0 lb

## 2019-12-21 DIAGNOSIS — I10 Essential (primary) hypertension: Secondary | ICD-10-CM | POA: Diagnosis not present

## 2019-12-21 DIAGNOSIS — I471 Supraventricular tachycardia: Secondary | ICD-10-CM | POA: Diagnosis not present

## 2019-12-21 NOTE — Progress Notes (Signed)
Office Visit    Patient Name: Jade Gardner Date of Encounter: 12/21/2019  Primary Care Provider:  Danae Orleans, MD Primary Cardiologist:  Ida Rogue, MD  Chief Complaint    74 year old female with a history of SVT, TIA of unclear etiology in 2000/2015, hypertension, hyperlipidemia with statin intolerance, mild bilateral carotid arterial disease, and NASH, who presents for follow-up of SVT.  Past Medical History    Past Medical History:  Diagnosis Date  . Diverticulosis   . GERD (gastroesophageal reflux disease)   . History of echocardiogram    a. 04/2013 Echo: EF 60-65%, no rwma. Mild MR.  Marland Kitchen Hypercalcemia    Secondar to calcium supplements. Normal PTH  . Hypercholesterolemia   . Hypertension    controlled on meds  . Pneumonia    in past  . SVT (supraventricular tachycardia) ([place])    a. 04/2013 Holter: RSR, PACs. Avg HR 69 (54-93).  Marland Kitchen TIA (transient ischemic attack)    x2  . TIA (transient ischemic attack)    x2  . UTI (urinary tract infection)   . Vertigo    Past Surgical History:  Procedure Laterality Date  . APPENDECTOMY  1954  . BREAST CYST ASPIRATION Right    neg  . CARDIAC CATHETERIZATION  [id number]  . CATARACT EXTRACTION W/PHACO Right 05/26/2017   Procedure: CATARACT EXTRACTION PHACO AND INTRAOCULAR LENS PLACEMENT ([place]) RIGHT;  Surgeon: Leandrew Koyanagi, MD;  Location: [place];  Service: Ophthalmology;  Laterality: Right;  . CATARACT EXTRACTION W/PHACO Left 08/18/2017   Procedure: CATARACT EXTRACTION PHACO AND INTRAOCULAR LENS PLACEMENT ([place]) LEFT;  Surgeon: Leandrew Koyanagi, MD;  Location: [place];  Service: Ophthalmology;  Laterality: Left;  . CHOLECYSTECTOMY  1981    Allergies  Allergies  Allergen Reactions  . Fish Oil Itching    Burning and stinging  . Simvastatin     Muscle Pain, Joint pain    History of Present Illness    74 year old female with above past medical history including PSVT, TIAs of  unclear etiology, hypertension, hyperlipidemia with statin intolerance, and mild bilateral carotid disease.  Previous catheterization in 1991 showed no significant coronary disease.  Holter monitoring 5 or 2015 showed sinus rhythm with PACs.  Most recent echo in 2015 showed normal LV function without findings to explain TIA.  She was last seen in cardiology clinic in August of this year for preprocedure evaluation pending EGD.  EGD has been postponed until December.  Since her last visit, she has continued to do well and she denies chest pain, palpitations, PND, orthopnea, dizziness, syncope, edema, or early satiety.  She has occasionally noted mild breathlessness if she is talking a lot or when she is at church and moves from a seated to a kneeling position.  She does not experience dyspnea with routine housework or other activities.   Home Medications    Prior to Admission medications   Medication Sig Start Date End Date Taking? Authorizing Provider  Ascorbic Acid (VITAMIN C) 1000 MG tablet Take 1,000 mg by mouth daily. am   Yes [provider]  clopidogrel (PLAVIX) 75 MG tablet TAKE 1 TABLET BY MOUTH  EVERY DAY WITH BREAKFAST 07/27/18  Yes Bacigalupo, Dionne Bucy, MD  Cranberry 500 MG CAPS Take by mouth. am   Yes [provider]  cyclobenzaprine (FLEXERIL) 10 MG tablet Take 0.5-1 tablets (5-10 mg total) by mouth at bedtime as needed for muscle spasms. 06/26/16  Yes Chrismon, Vickki Muff, PA  ezetimibe (ZETIA) 10 MG  tablet TAKE 1 TABLET BY MOUTH  DAILY 05/06/18  Yes Gollan, Kathlene November, MD  fluticasone (FLONASE) 50 MCG/ACT nasal spray Place 1 spray into both nostrils 2 (two) times daily. 02/12/18  Yes Laurene Footman B, PA-C  ibandronate (BONIVA) 150 MG tablet TAKE 1 TABLET BY MOUTH ONCE MONTHLY WITH FULL GLASS WATER ON AN EMPTY STOMACH, NO FOOD, DRINK OR MEDICATION 09/23/17  Yes Bacigalupo, Dionne Bucy, MD  meclizine (ANTIVERT) 25 MG tablet Take 1 tablet (25 mg total) by mouth 3 (three) times daily  as needed for dizziness. 07/20/17  Yes Bacigalupo, Dionne Bucy, MD  metoprolol succinate (TOPROL-XL) 50 MG 24 hr tablet TAKE 1 TABLET BY MOUTH  DAILY 01/20/19  Yes Gollan, Kathlene November, MD  montelukast (SINGULAIR) 10 MG tablet Take 1 tablet (10 mg total) by mouth at bedtime. Please schedule office visit before any future refills 04/20/19  Yes Bacigalupo, Dionne Bucy, MD  omeprazole (PRILOSEC) 40 MG capsule Take 40 mg by mouth daily.  09/27/19 09/26/20 Yes [provider]  vitamin E 180 MG (400 UNITS) capsule daily. 10/01/18  Yes [provider]  Evolocumab (REPATHA SURECLICK) 349 MG/ML SOAJ Inject 1 pen into the skin every 14 (fourteen) days. 10/12/19   Marrianne Mood D, PA-C  ondansetron (ZOFRAN ODT) 4 MG disintegrating tablet Take 1 tablet (4 mg total) by mouth every 6 (six) hours as needed for nausea or vomiting. 07/08/15   Delman Kitten, MD    Review of Systems    She denies chest pain, palpitations, PND, orthopnea, dizziness, syncope, edema, or early satiety.  As noted above, she sometimes experiences breathlessness when talking a lot or when kneeling at church.  The symptoms have resolved by talking more slowly and by sitting while praying..  All other systems reviewed and are otherwise negative except as noted above.  Physical Exam    VS:  BP 130/84 (BP Location: Left Arm, Patient Position: Sitting, Cuff Size: Normal)   Pulse (!) 57   Ht 5' 2.5" (1.588 m)   Wt 149 lb (67.6 kg)   SpO2 97%   BMI 26.82 kg/m  , BMI Body mass index is 26.82 kg/m. GEN: Well nourished, well developed, in no acute distress. HEENT: normal. Neck: Supple, no JVD, carotid bruits, or masses. Cardiac: RRR, no murmurs, rubs, or gallops. No clubbing, cyanosis, edema.  Radials/PT 2+ and equal bilaterally.  Respiratory:  Respirations regular and unlabored, clear to auscultation bilaterally. GI: Soft, nontender, nondistended, BS + x 4. MS: no deformity or atrophy. Skin: warm and dry, no rash. Neuro:  Strength and  sensation are intact. Psych: Normal affect.  Accessory Clinical Findings    ECG personally reviewed by me today -sinus bradycardia, 57, nonspecific T changes- no acute changes.  Lab Results  Component Value Date   WBC 4.9 12/17/2017   HGB 13.2 12/17/2017   HCT 40.7 12/17/2017   MCV 87 12/17/2017   PLT 183 12/17/2017   Lab Results  Component Value Date   CREATININE 0.83 12/17/2017   BUN 14 12/17/2017   NA 141 12/17/2017   K 4.1 12/17/2017   CL 106 12/17/2017   CO2 21 12/17/2017   Lab Results  Component Value Date   ALT 20 12/17/2017   AST 34 12/17/2017   ALKPHOS 56 12/17/2017   BILITOT 0.5 12/17/2017   Lab Results  Component Value Date   CHOL 212 (H) 10/09/2019   HDL 45 10/09/2019   LDLCALC 128 (H) 10/09/2019   LDLDIRECT 134 (H) 10/09/2019   TRIG  222 (H) 10/09/2019   CHOLHDL 4.7 (H) 10/09/2019    Lab Results  Component Value Date   HGBA1C 5.4 12/17/2017    Assessment & Plan    1.  History of PSVT: No recent palpitations.  She remains on metoprolol therapy.  2.  Essential hypertension: Stable on beta-blocker therapy.  3.  Hyperlipidemia: She is statin intolerant.  She remains on Zetia with an LDL of 134 in August.  She was offered Repatha in the past but continues to wish to avoid.  She is working on losing weight through intermittent fasting and is currently down 10 pounds.  4.  History of TIA: She remains on Plavix therapy without recurrent symptoms.  5.  Mild carotid arterial disease: Less than 39% bilateral carotid artery stenosis on ultrasound in April 2018.  No bruits on exam.  Continue risk factor modification.  Encourage patient to reconsider [place] therapy.  6.  NASH: Followed by GI.  7.  Disposition: Follow-up in 1 year or sooner if necessary.   Murray Hodgkins, NP 12/21/2019, 12:50 PM

## 2019-12-21 NOTE — Patient Instructions (Signed)
Medication Instructions:  Your physician recommends that you continue on your current medications as directed. Please refer to the Current Medication list given to you today.  *If you need a refill on your cardiac medications before your next appointment, please call your pharmacy*  Follow-Up: At [facility], you and your health needs are our priority.  As part of our continuing mission to provide you with exceptional heart care, we have created designated Provider Care Teams.  These Care Teams include your primary Cardiologist (physician) and Advanced Practice Providers (APPs -  Physician Assistants and Nurse Practitioners) who all work together to provide you with the care you need, when you need it.  We recommend signing up for the patient portal called "MyChart".  Sign up information is provided on this After Visit Summary.  MyChart is used to connect with patients for Virtual Visits (Telemedicine).  Patients are able to view lab/test results, encounter notes, upcoming appointments, etc.  Non-urgent messages can be sent to your provider as well.   To learn more about what you can do with MyChart, go to [email or web address].    Your next appointment:   12 month(s)  The format for your next appointment:   In Person  Provider:   You may see Ida Rogue, MD or one of the following Advanced Practice Providers on your designated Care Team:    Murray Hodgkins, NP  Christell Faith, PA-C  Marrianne Mood, PA-C  Cadence [place], [place]

## 2020-02-17 ENCOUNTER — Ambulatory Visit
Admission: EM | Admit: 2020-02-17 | Discharge: 2020-02-17 | Disposition: A | Discharge status: Home / Self Care | Payer: Medicare Other | Attending: Emergency Medicine | Admitting: Emergency Medicine

## 2020-02-17 ENCOUNTER — Encounter: Payer: Self-pay | Admitting: Emergency Medicine

## 2020-02-17 ENCOUNTER — Other Ambulatory Visit: Payer: Self-pay

## 2020-02-17 DIAGNOSIS — R059 Cough, unspecified: Secondary | ICD-10-CM | POA: Insufficient documentation

## 2020-02-17 DIAGNOSIS — J069 Acute upper respiratory infection, unspecified: Secondary | ICD-10-CM | POA: Insufficient documentation

## 2020-02-17 DIAGNOSIS — Z20822 Contact with and (suspected) exposure to covid-19: Secondary | ICD-10-CM | POA: Diagnosis not present

## 2020-02-17 LAB — RESP PANEL BY RT-PCR (FLU A&B, COVID) ARPGX2
Influenza A by PCR: NEGATIVE
Influenza B by PCR: NEGATIVE
SARS Coronavirus 2 by RT PCR: NEGATIVE

## 2020-02-17 MED ORDER — PROMETHAZINE-DM 6.25-15 MG/5ML PO SYRP: 5.0000 mL | ORAL_SOLUTION | Freq: Four times a day (QID) | ORAL | 0 refills | Status: DC | PRN

## 2020-02-17 MED ORDER — BENZONATATE 100 MG PO CAPS: 200.0000 mg | ORAL_CAPSULE | Freq: Three times a day (TID) | ORAL | 0 refills | Status: DC

## 2020-02-17 NOTE — Discharge Instructions (Signed)
Your testing today did not reveal the presence of flu or Covid.  Use the Tessalon Perles during the day as needed for cough.  Take them with a small sip of water.  They may give you some numbness to the base of your tongue or a metallic taste in her mouth and this is normal.  Use the cough syrup at bedtime for cough and congestion and sleep.  Perform sinus irrigation 2-3 times a day with a NeilMed sinus rinse kit and warm distilled water.  Do not use tap water.  This will help alleviate some of your congestion.  If your symptoms continue or worsen return for reevaluation or see your primary care provider.

## 2020-02-17 NOTE — ED Triage Notes (Signed)
Patient c/o cough, congestion, runny nose, headache and sinus pressure that started yesterday.  Patient reports low grade fevers.

## 2020-02-17 NOTE — ED Provider Notes (Signed)
[facility]    CSN: [id number] Arrival date & time: 02/17/20  1421      History   Chief Complaint Chief Complaint  Patient presents with   Headache   Sinus Problem    HPI Jade Gardner is a 74 y.o. female.   HPI  74 year old female here for evaluation of cough, nasal congestion, runny nose, headache, and fever.  Patient reports that her symptoms started yesterday.  She has had some associated sinus pressure and scratchy throat.  Her cough is nonproductive and has had some associated body aches.  Patient denies ear pain or pressure, shortness of breath, wheezing, changes to sense of taste or smell, GI complaints, or sick contacts.  Patient has had her Covid vaccine but not the booster and her flu shot.   Past Medical History:  Diagnosis Date   Diverticulosis    GERD (gastroesophageal reflux disease)    History of echocardiogram    a. 04/2013 Echo: EF 60-65%, no rwma. Mild MR.   Hypercalcemia    Secondar to calcium supplements. Normal PTH   Hypercholesterolemia    Hypertension    controlled on meds   Pneumonia    in past   SVT (supraventricular tachycardia) (HCC)    a. 04/2013 Holter: RSR, PACs. Avg HR 69 (54-93).   TIA (transient ischemic attack)    x2   TIA (transient ischemic attack)    x2   UTI (urinary tract infection)    Vertigo     Patient Active Problem List   Diagnosis Date Noted   Elevated TSH 05/18/2017   Leg cramps 05/18/2017   Hyperlipidemia 11/28/2015   H/O transient cerebral ischemia 05/29/2015   Personal history of other diseases of the musculoskeletal system and connective tissue 05/29/2015   Bronchitis, mucopurulent recurrent ([place]) 05/29/2015   Diverticulosis 08/24/2014   Fibrocystic breast disease 08/24/2014   Anemia 08/23/2014   Allergic rhinitis 08/23/2014   DDD (degenerative disc disease), thoracolumbar 08/23/2014   Hearing loss 08/23/2014   Insomnia 08/23/2014   Osteoporosis 08/23/2014    Overweight (BMI 25.0-29.9) 08/23/2014   Pre-diabetes 08/23/2014   Obstructive sleep apnea 08/23/2014   Varicose veins of both lower extremities without ulcer or inflammation 08/23/2014   B12 deficiency 08/23/2014   Vitamin D deficiency 08/23/2014   Carotid stenosis 09/15/2013   SVT (supraventricular tachycardia) ([place]) 82/95/6213   Nonalcoholic steatohepatitis ([facility]) 05/20/2009   Diaphragmatic hernia 08/10/2008   Cardiac conduction disorder 08/10/2008   Benign essential HTN 08/10/2008   GERD (gastroesophageal reflux disease) 08/10/2008    Past Surgical History:  Procedure Laterality Date   APPENDECTOMY  1954   BREAST CYST ASPIRATION Right    neg   CARDIAC CATHETERIZATION  [id number]   CATARACT EXTRACTION W/PHACO Right 05/26/2017   Procedure: CATARACT EXTRACTION PHACO AND INTRAOCULAR LENS PLACEMENT ([place]) RIGHT;  Surgeon: Leandrew Koyanagi, MD;  Location: [place];  Service: Ophthalmology;  Laterality: Right;   CATARACT EXTRACTION W/PHACO Left 08/18/2017   Procedure: CATARACT EXTRACTION PHACO AND INTRAOCULAR LENS PLACEMENT ([place]) LEFT;  Surgeon: Leandrew Koyanagi, MD;  Location: [place] [facility];  Service: Ophthalmology;  Laterality: Left;   CHOLECYSTECTOMY  1981    OB History   No obstetric history on file.      Home Medications    Prior to Admission medications   Medication Sig Start Date End Date Taking? Authorizing Provider  Ascorbic Acid (VITAMIN C) 1000 MG tablet Take 1,000 mg by mouth daily. am   Yes [provider]  carvedilol (COREG)  3.125 MG tablet Take by mouth. 02/02/20 03/03/20 Yes [provider]  clopidogrel (PLAVIX) 75 MG tablet TAKE 1 TABLET BY MOUTH  EVERY DAY WITH BREAKFAST 07/27/18  Yes Bacigalupo, Dionne Bucy, MD  Cranberry 500 MG CAPS Take by mouth. am   Yes [provider]  ezetimibe (ZETIA) 10 MG tablet TAKE 1 TABLET BY MOUTH  DAILY 05/06/18  Yes Gollan, Kathlene November, MD  fluticasone (FLONASE) 50 MCG/ACT  nasal spray Place 1 spray into both nostrils 2 (two) times daily. 02/12/18  Yes Laurene Footman B, PA-C  ibandronate (BONIVA) 150 MG tablet TAKE 1 TABLET BY MOUTH ONCE MONTHLY WITH FULL GLASS WATER ON AN EMPTY STOMACH, NO FOOD, DRINK OR MEDICATION 09/23/17  Yes Bacigalupo, Dionne Bucy, MD  montelukast (SINGULAIR) 10 MG tablet Take 1 tablet (10 mg total) by mouth at bedtime. Please schedule office visit before any future refills 04/20/19  Yes Bacigalupo, Dionne Bucy, MD  omeprazole (PRILOSEC) 40 MG capsule Take 40 mg by mouth daily.  09/27/19 09/26/20 Yes [provider]  vitamin E 180 MG (400 UNITS) capsule daily. 10/01/18  Yes [provider]  benzonatate (TESSALON) 100 MG capsule Take 2 capsules (200 mg total) by mouth every 8 (eight) hours. 02/17/20   Margarette Canada, NP  cyclobenzaprine (FLEXERIL) 10 MG tablet Take 0.5-1 tablets (5-10 mg total) by mouth at bedtime as needed for muscle spasms. 06/26/16   Chrismon, Vickki Muff, PA-C  meclizine (ANTIVERT) 25 MG tablet Take 1 tablet (25 mg total) by mouth 3 (three) times daily as needed for dizziness. 07/20/17   [name], MD  promethazine-dextromethorphan (PROMETHAZINE-DM) 6.25-15 MG/5ML syrup Take 5 mLs by mouth 4 (four) times daily as needed. 02/17/20   Margarette Canada, NP  metoprolol succinate (TOPROL-XL) 50 MG 24 hr tablet TAKE 1 TABLET BY MOUTH  DAILY 01/20/19 02/17/20  Minna Merritts, MD    Family History Family History  Problem Relation Age of Onset   Heart disease Father    Dementia Father    Heart disease Mother    Heart disease Brother    Stroke Other    Breast cancer Maternal Aunt 50    Social History Social History   Tobacco Use   Smoking status: Never Smoker   Smokeless tobacco: Never Used  Scientific laboratory technician Use: Never used  Substance Use Topics   Alcohol use: No   Drug use: No     Allergies   Fish oil and Simvastatin   Review of Systems Review of Systems  Constitutional: Positive for  fever. Negative for activity change and appetite change.  HENT: Positive for congestion, rhinorrhea, sinus pressure and sore throat. Negative for ear pain.   Respiratory: Positive for cough. Negative for shortness of breath and wheezing.   Cardiovascular: Negative for chest pain.  Gastrointestinal: Negative for abdominal pain, diarrhea and vomiting.  Musculoskeletal: Negative for arthralgias and myalgias.  Skin: Negative for rash.  Neurological: Positive for headaches.  Hematological: Negative.   Psychiatric/Behavioral: Negative.      Physical Exam Triage Vital Signs ED Triage Vitals  Enc Vitals Group     BP 02/17/20 1445 (!) 177/78     Pulse Rate 02/17/20 1445 72     Resp 02/17/20 1445 14     Temp 02/17/20 1445 98.4 F (36.9 C)     Temp Source 02/17/20 1445 Oral     SpO2 02/17/20 1445 97 %     Weight 02/17/20 1440 149 lb (67.6 kg)  Height 02/17/20 1440 5' 2.5" (1.588 m)     Head Circumference --      Peak Flow --      Pain Score 02/17/20 1440 7     Pain Loc --      Pain Edu? --      Excl. in [place]? --    No data found.  Updated Vital Signs BP (!) 177/78 (BP Location: Left Arm)    Pulse 72    Temp 98.4 F (36.9 C) (Oral)    Resp 14    Ht 5' 2.5" (1.588 m)    Wt 149 lb (67.6 kg)    SpO2 97%    BMI 26.82 kg/m   Visual Acuity Right Eye Distance:   Left Eye Distance:   Bilateral Distance:    Right Eye Near:   Left Eye Near:    Bilateral Near:     Physical Exam Vitals and nursing note reviewed.  Constitutional:      General: She is not in acute distress.    Appearance: She is well-developed and normal weight. She is not toxic-appearing.  HENT:     Head: Normocephalic and atraumatic.     Comments: Nasal mucosa is erythematous and edematous with clear nasal discharge bilaterally.  Patient has tenderness to percussion of her maxillary sinuses bilaterally.    Mouth/Throat:     Mouth: Mucous membranes are moist.     Pharynx: Oropharynx is clear.     Comments:  Posterior oropharynx has mild erythema and clear postnasal drip. Cardiovascular:     Rate and Rhythm: Normal rate and regular rhythm.     Heart sounds: Normal heart sounds. No murmur heard. No gallop.   Pulmonary:     Effort: Pulmonary effort is normal.     Breath sounds: Normal breath sounds. No wheezing, rhonchi or rales.  Musculoskeletal:     Cervical back: Normal range of motion and neck supple.  Lymphadenopathy:     Cervical: No cervical adenopathy.  Skin:    General: Skin is warm and dry.     Capillary Refill: Capillary refill takes less than 2 seconds.     Findings: No erythema.  Neurological:     Mental Status: She is alert and oriented to person, place, and time.     GCS: GCS eye subscore is 4. GCS verbal subscore is 5. GCS motor subscore is 6.  Psychiatric:        Mood and Affect: Mood normal.        Speech: Speech normal.        Behavior: Behavior normal.      UC Treatments / Results  Labs (all labs ordered are listed, but only abnormal results are displayed) Labs Reviewed  RESP PANEL BY RT-PCR (FLU A&B, COVID) [id number]    EKG   Radiology No results found.  Procedures Procedures (including critical care time)  Medications Ordered in UC Medications - No data to display  Initial Impression / Assessment and Plan / UC Course  I have reviewed the triage vital signs and the nursing notes.  Pertinent labs & imaging results that were available during my care of the patient were reviewed by me and considered in my medical decision making (see chart for details).   Patient is here for evaluation of cold symptoms that have been going on for the past 1 day.  Patient does report that she had some mild laryngitis 2 days ago.  Physical exam reveals some nasal mucosa inflammation and clear  to nasal discharge there is also clear postnasal drip present.  Lungs are clear to auscultation.  Triplex panel sent at triage.  Respiratory triplex panel is negative for flu and  Covid.  We will discharge patient with a diagnosis of viral URI with cough.  Will treat cough with Tessalon Perles and Promethazine DM.  Urged patient to sinus irrigation to help with nasal congestion.  Final Clinical Impressions(s) / UC Diagnoses   Final diagnoses:  Viral upper respiratory tract infection     Discharge Instructions     Your testing today did not reveal the presence of flu or Covid.  Use the Tessalon Perles during the day as needed for cough.  Take them with a small sip of water.  They may give you some numbness to the base of your tongue or a metallic taste in her mouth and this is normal.  Use the cough syrup at bedtime for cough and congestion and sleep.  Perform sinus irrigation 2-3 times a day with a NeilMed sinus rinse kit and warm distilled water.  Do not use tap water.  This will help alleviate some of your congestion.  If your symptoms continue or worsen return for reevaluation or see your primary care provider.    ED Prescriptions    Medication Sig Dispense Auth. Provider   benzonatate (TESSALON) 100 MG capsule Take 2 capsules (200 mg total) by mouth every 8 (eight) hours. 21 capsule Margarette Canada, NP   promethazine-dextromethorphan (PROMETHAZINE-DM) 6.25-15 MG/5ML syrup Take 5 mLs by mouth 4 (four) times daily as needed. 118 mL Margarette Canada, NP     PDMP not reviewed this encounter.   Margarette Canada, NP 02/17/20 1546

## 2020-02-26 ENCOUNTER — Telehealth: Payer: Self-pay | Admitting: Cardiovascular Disease

## 2020-02-26 NOTE — Telephone Encounter (Signed)
Patient calling Another provider took patient off of metoprolol and had her start carvedilol 125 MG 2 times daily Patient would like to know if Dr Rockey Situ approves of this change Please call to discuss

## 2020-02-27 NOTE — Telephone Encounter (Signed)
Called pt back regarding her concern on recent medication changes. Pt had a colonoscopy on 02/02/2020 at [place], Dr. Rea College with GI stated pt "had some enlarge vessels and d/t her hx of cirrhosis of the liver, he took her off metoprolol and placed her on Carvedilol 146m BID, to protect her vessel". Pt is wondering if this is the right decision since he is not her cardiologist. Advised pt will reach out to Dr. GRockey Situfor recommendations and will give a call back based on Dr. GGwenyth Oberadvise. Pt verbalized understanding.   Message sent to Dr. GRockey Situfor review and this was also verbalized to Dr. GRockey Situwhile in office, no orders at this time

## 2020-02-29 NOTE — Telephone Encounter (Signed)
Called pt back after speaking with Dr. Rockey Situ about her GI MD changing her cardiac medicines based on her recent GI procedure. Dr. Rockey Situ is okay with Dr. Marene Lenz taking pt off her metoprolol and placing her on Carvedilol 168m BID, to protect her vessels. Dr. GRockey Situstated they could discuss this more in detail at their next encounter or if she has any side affects or concerns with taking new medication then to call officer back with concerns. Otherwise all questions or concerns were address and no additional concerns at this time. Agreeable to plan, will call back for anything further.

## 2020-02-29 NOTE — Telephone Encounter (Signed)
Patient calling back in to check on the status of this

## 2020-03-03 NOTE — Telephone Encounter (Signed)
I think that should be fine, with recommend she check blood pressure heart rate on the new medication

## 2020-03-25 ENCOUNTER — Telehealth: Payer: Self-pay | Admitting: Cardiovascular Disease

## 2020-03-25 NOTE — Telephone Encounter (Signed)
Received fax from [employer] stating they have started a PA for Repatha Sureclick 245YK/DX auto-injectors.  Sent request to plan.  Key: [id number]

## 2020-03-26 NOTE — Telephone Encounter (Signed)
[place]: [id number] - PA Case ID: [id number]  Need help? Call us at [phone]  Outcome Denied today  Request Reference Number: [id number]. REPATHA SURE INJ 140MG/ML is denied for not meeting the prior authorization requirement(s). Details of this decision are in the notice attached below or have been faxed to you. Appeals are not supported through [place]. Please refer to the fax case notice for appeals information and instructions.

## 2020-03-26 NOTE — Telephone Encounter (Addendum)
Jade Gardner (Key: [id number])  [employer] is reviewing your PA request. Typically an electronic response will be received within 72 hours.

## 2020-03-28 NOTE — Telephone Encounter (Signed)
Spoke to [place] with [employer] regarding denial. States that we may appeal and verify that pt has had LDL reduction while on Repatha.

## 2020-03-29 NOTE — Telephone Encounter (Signed)
New PA has been started for pt.  Jade Gardner (Key: [id number]) Repatha SureClick 148WS/BB auto-injectors   Form [employer] Medicare Part D Electronic Prior Authorization Form (2017 [employer]CP[employer])   Determination Wait for Determination  Please wait for [employer] 2017 NCPDP to return a determination.

## 2020-03-29 NOTE — Telephone Encounter (Signed)
Received a fax from [employer] stating they have cancelled the new PA for Repatha because it has already been approved from 03-02-20 through 03-01-21.

## 2020-04-03 NOTE — Telephone Encounter (Signed)
Spoke to PA representative, Theresia Lo, and he does confirm that pt was approved by the plan.  He confirms the original approval # is [id number] and pt is approved from 03/02/20 - 03/01/21.

## 2020-12-14 ENCOUNTER — Other Ambulatory Visit: Payer: Self-pay

## 2020-12-14 ENCOUNTER — Ambulatory Visit
Admission: RE | Admit: 2020-12-14 | Discharge: 2020-12-14 | Disposition: A | Discharge status: Home / Self Care | Payer: Medicare Other | Source: Ambulatory Visit | Attending: Physician Assistant | Admitting: Physician Assistant

## 2020-12-14 VITALS — BP 131/63 | HR 68 | Temp 98.2°F | Resp 16 | Ht 62.0 in | Wt 152.0 lb

## 2020-12-14 DIAGNOSIS — Z20822 Contact with and (suspected) exposure to covid-19: Secondary | ICD-10-CM | POA: Diagnosis not present

## 2020-12-14 DIAGNOSIS — R051 Acute cough: Secondary | ICD-10-CM | POA: Diagnosis not present

## 2020-12-14 DIAGNOSIS — B349 Viral infection, unspecified: Secondary | ICD-10-CM | POA: Diagnosis not present

## 2020-12-14 DIAGNOSIS — J029 Acute pharyngitis, unspecified: Secondary | ICD-10-CM | POA: Diagnosis not present

## 2020-12-14 LAB — POC SARS CORONAVIRUS 2 AG: SARSCOV2ONAVIRUS 2 AG: NEGATIVE

## 2020-12-14 MED ORDER — PSEUDOEPH-BROMPHEN-DM 30-2-10 MG/5ML PO SYRP: 5.0000 mL | ORAL_SOLUTION | Freq: Four times a day (QID) | ORAL | 0 refills | Status: DC | PRN

## 2020-12-14 MED ORDER — IPRATROPIUM BROMIDE 0.06 % NA SOLN: 1.0000 | Freq: Four times a day (QID) | NASAL | 0 refills | Status: AC

## 2020-12-14 NOTE — Discharge Instructions (Signed)

## 2020-12-14 NOTE — ED Provider Notes (Signed)
[facility]    CSN: [id number] Arrival date & time: 12/14/20  1209      History   Chief Complaint Chief Complaint  Patient presents with   Generalized Body Aches   Cough   Sore Throat    HPI Jade Gardner is a 75 y.o. female presenting for 4 to 5-day history of chills, fatigue, body aches, sore throat and cough.  Denies any chest pain or shortness of breath.  Says she has not really had any congestion.  Symptoms started the day after she received her flu shot.  Patient has been taking over-the-counter cough medication without improvement in symptoms.  She says she feels like her symptoms have gotten worse, although she says today she feels better than she did yesterday.  She has not had any fevers.  She says a couple of her family members have COVID-19 and she saw them last week but they were not sick last week.  She has personal history of hypertension, hyperlipidemia and TIAs.  No other complaints today.  HPI  Past Medical History:  Diagnosis Date   Diverticulosis    GERD (gastroesophageal reflux disease)    History of echocardiogram    a. 04/2013 Echo: EF 60-65%, no rwma. Mild MR.   Hypercalcemia    Secondar to calcium supplements. Normal PTH   Hypercholesterolemia    Hypertension    controlled on meds   Pneumonia    in past   SVT (supraventricular tachycardia) (HCC)    a. 04/2013 Holter: RSR, PACs. Avg HR 69 (54-93).   TIA (transient ischemic attack)    x2   TIA (transient ischemic attack)    x2   UTI (urinary tract infection)    Vertigo     Patient Active Problem List   Diagnosis Date Noted   Elevated TSH 05/18/2017   Leg cramps 05/18/2017   Hyperlipidemia 11/28/2015   H/O transient cerebral ischemia 05/29/2015   Personal history of other diseases of the musculoskeletal system and connective tissue 05/29/2015   Bronchitis, mucopurulent recurrent ([place]) 05/29/2015   Diverticulosis 08/24/2014   Fibrocystic breast disease 08/24/2014   Anemia  08/23/2014   Allergic rhinitis 08/23/2014   DDD (degenerative disc disease), thoracolumbar 08/23/2014   Hearing loss 08/23/2014   Insomnia 08/23/2014   Osteoporosis 08/23/2014   Overweight (BMI 25.0-29.9) 08/23/2014   Pre-diabetes 08/23/2014   Obstructive sleep apnea 08/23/2014   Varicose veins of both lower extremities without ulcer or inflammation 08/23/2014   B12 deficiency 08/23/2014   Vitamin D deficiency 08/23/2014   Carotid stenosis 09/15/2013   SVT (supraventricular tachycardia) ([place]) 40/98/1191   Nonalcoholic steatohepatitis ([facility]) 05/20/2009   Diaphragmatic hernia 08/10/2008   Cardiac conduction disorder 08/10/2008   Benign essential HTN 08/10/2008   GERD (gastroesophageal reflux disease) 08/10/2008    Past Surgical History:  Procedure Laterality Date   APPENDECTOMY  1954   BREAST CYST ASPIRATION Right    neg   CARDIAC CATHETERIZATION  4782&[id number]   CATARACT EXTRACTION W/PHACO Right 05/26/2017   Procedure: CATARACT EXTRACTION PHACO AND INTRAOCULAR LENS PLACEMENT ([place]) RIGHT;  Surgeon: Leandrew Koyanagi, MD;  Location: [place];  Service: Ophthalmology;  Laterality: Right;   CATARACT EXTRACTION W/PHACO Left 08/18/2017   Procedure: CATARACT EXTRACTION PHACO AND INTRAOCULAR LENS PLACEMENT ([place]) LEFT;  Surgeon: Leandrew Koyanagi, MD;  Location: [place];  Service: Ophthalmology;  Laterality: Left;   CHOLECYSTECTOMY  1981    OB History   No obstetric history on file.      Home  Medications    Prior to Admission medications   Medication Sig Start Date End Date Taking? Authorizing Provider  brompheniramine-pseudoephedrine-DM 30-2-10 MG/5ML syrup Take 5 mLs by mouth 4 (four) times daily as needed for up to 7 days. 12/14/20 12/21/20 Yes Laurene Footman B, PA-C  ipratropium (ATROVENT) 0.06 % nasal spray Place 1 spray into both nostrils 4 (four) times daily. 12/14/20  Yes Danton Clap, PA-C  Ascorbic Acid (VITAMIN C) 1000 MG tablet Take 1,000 mg by  mouth daily. am    [provider]  carvedilol (COREG) 3.125 MG tablet Take by mouth. 02/02/20 03/03/20  [provider]  clopidogrel (PLAVIX) 75 MG tablet TAKE 1 TABLET BY MOUTH  EVERY DAY WITH BREAKFAST 07/27/18   Bacigalupo, Dionne Bucy, MD  Cranberry 500 MG CAPS Take by mouth. am    [provider]  cyclobenzaprine (FLEXERIL) 10 MG tablet Take 0.5-1 tablets (5-10 mg total) by mouth at bedtime as needed for muscle spasms. 06/26/16   Chrismon, Vickki Muff, PA-C  ezetimibe (ZETIA) 10 MG tablet TAKE 1 TABLET BY MOUTH  DAILY 05/06/18   Minna Merritts, MD  fluticasone (FLONASE) 50 MCG/ACT nasal spray Place 1 spray into both nostrils 2 (two) times daily. 02/12/18   Laurene Footman B, PA-C  ibandronate (BONIVA) 150 MG tablet TAKE 1 TABLET BY MOUTH ONCE MONTHLY WITH FULL GLASS WATER ON AN EMPTY STOMACH, NO FOOD, DRINK OR MEDICATION 09/23/17   [name], MD  meclizine (ANTIVERT) 25 MG tablet Take 1 tablet (25 mg total) by mouth 3 (three) times daily as needed for dizziness. 07/20/17   [name], MD  montelukast (SINGULAIR) 10 MG tablet Take 1 tablet (10 mg total) by mouth at bedtime. Please schedule office visit before any future refills 04/20/19   [name], MD  vitamin E 180 MG (400 UNITS) capsule daily. 10/01/18   [provider]  metoprolol succinate (TOPROL-XL) 50 MG 24 hr tablet TAKE 1 TABLET BY MOUTH  DAILY 01/20/19 02/17/20  Minna Merritts, MD    Family History Family History  Problem Relation Age of Onset   Heart disease Father    Dementia Father    Heart disease Mother    Heart disease Brother    Stroke Other    Breast cancer Maternal Aunt 50    Social History Social History   Tobacco Use   Smoking status: Never   Smokeless tobacco: Never  Vaping Use   Vaping Use: Never used  Substance Use Topics   Alcohol use: No   Drug use: No     Allergies   Fish oil and Simvastatin   Review of Systems Review of Systems   Constitutional:  Positive for chills and fatigue. Negative for diaphoresis and fever.  HENT:  Positive for sore throat. Negative for congestion, ear pain, rhinorrhea, sinus pressure and sinus pain.   Respiratory:  Positive for cough. Negative for shortness of breath.   Gastrointestinal:  Negative for abdominal pain, nausea and vomiting.  Musculoskeletal:  Positive for myalgias. Negative for arthralgias.  Skin:  Negative for rash.  Neurological:  Positive for headaches. Negative for weakness.  Hematological:  Negative for adenopathy.    Physical Exam Triage Vital Signs ED Triage Vitals  Enc Vitals Group     BP 12/14/20 1236 131/63     Pulse Rate 12/14/20 1236 68     Resp 12/14/20 1236 16     Temp 12/14/20 1236 98.2 F (36.8 C)  Temp Source 12/14/20 1236 Oral     SpO2 12/14/20 1236 97 %     Weight 12/14/20 1233 152 lb (68.9 kg)     Height 12/14/20 1233 5' 2"  (1.575 m)     Head Circumference --      Peak Flow --      Pain Score 12/14/20 1232 5     Pain Loc --      Pain Edu? --      Excl. in [place]? --    No data found.  Updated Vital Signs BP 131/63 (BP Location: Right Arm)   Pulse 68   Temp 98.2 F (36.8 C) (Oral)   Resp 16   Ht 5' 2"  (1.575 m)   Wt 152 lb (68.9 kg)   SpO2 97%   BMI 27.80 kg/m      Physical Exam Vitals and nursing note reviewed.  Constitutional:      General: She is not in acute distress.    Appearance: Normal appearance. She is well-developed. She is ill-appearing. She is not toxic-appearing.  HENT:     Head: Normocephalic and atraumatic.     Nose: Nose normal.     Mouth/Throat:     Mouth: Mucous membranes are moist.     Pharynx: Oropharynx is clear.  Eyes:     General: No scleral icterus.       Right eye: No discharge.        Left eye: No discharge.     Conjunctiva/sclera: Conjunctivae normal.  Cardiovascular:     Rate and Rhythm: Normal rate and regular rhythm.     Heart sounds: Normal heart sounds.  Pulmonary:     Effort: Pulmonary  effort is normal. No respiratory distress.     Breath sounds: Normal breath sounds. No wheezing, rhonchi or rales.  Musculoskeletal:     Cervical back: Neck supple.  Skin:    General: Skin is dry.  Neurological:     General: No focal deficit present.     Mental Status: She is alert. Mental status is at baseline.     Motor: No weakness.     Gait: Gait normal.  Psychiatric:        Mood and Affect: Mood normal.        Behavior: Behavior normal.        Thought Content: Thought content normal.     UC Treatments / Results  Labs (all labs ordered are listed, but only abnormal results are displayed) Labs Reviewed  SARS CORONAVIRUS 2 (TAT 6-24 HRS)  POC SARS CORONAVIRUS 2 AG    EKG   Radiology No results found.  Procedures Procedures (including critical care time)  Medications Ordered in UC Medications - No data to display  Initial Impression / Assessment and Plan / UC Course  I have reviewed the triage vital signs and the nursing notes.  Pertinent labs & imaging results that were available during my care of the patient were reviewed by me and considered in my medical decision making (see chart for details).   75 year old female presenting for 4-day history of chills, fatigue, headaches, cough, sore throat.  Reports symptoms started not feeling after she received the flu shot.  Possible exposure to COVID-19 last week through family members.  Vitals all normal and stable.  Patient is ill-appearing but nontoxic.  No congestion on exam.  Chest is clear to auscultation heart regular rate and rhythm.  Rapid COVID test negative.  PCR COVID test obtained.  Current CDC guidelines,  isolation protocol and ED precautions reviewed with patient.    Advised symptoms consistent with viral illness.  Advised increasing rest and fluids.  I have sent Bromfed-DM and Atrovent nasal spray.  Encouraged her to follow-up with [place] for any acute worsening symptoms.  Go to ED for any uncontrollable fever,  chest pain or breathing difficulty.  Final Clinical Impressions(s) / UC Diagnoses   Final diagnoses:  Viral illness  Acute cough  Sore throat  Exposure to COVID-19 virus     Discharge Instructions      URI/COLD SYMPTOMS: Your exam today is consistent with a viral illness. Antibiotics are not indicated at this time. Use medications as directed, including cough syrup, nasal saline, and decongestants. Your symptoms should improve over the next few days and resolve within 7-10 days. Increase rest and fluids. F/u if symptoms worsen or predominate such as sore throat, ear pain, productive cough, shortness of breath, or if you develop high fevers or worsening fatigue over the next several days.    You have received COVID testing today either for positive exposure, concerning symptoms that could be related to COVID infection, screening purposes, or re-testing after confirmed positive.  Your test obtained today checks for active viral infection in the last 1-2 weeks. If your test is negative now, you can still test positive later. So, if you do develop symptoms you should either get re-tested and/or isolate x 5 days and then strict mask use x 5 days (unvaccinated) or mask use x 10 days (vaccinated). Please follow CDC guidelines.  While Rapid antigen tests come back in 15-20 minutes, send out PCR/molecular test results typically come back within 1-3 days. In the mean time, if you are symptomatic, assume this could be a positive test and treat/monitor yourself as if you do have COVID.   We will call with test results if positive. Please download the MyChart app and set up a profile to access test results.   If symptomatic, go home and rest. Push fluids. Take Tylenol as needed for discomfort. Gargle warm salt water. Throat lozenges. Take Mucinex DM or Robitussin for cough. Humidifier in bedroom to ease coughing. Warm showers. Also review the COVID handout for more information.  COVID-19 INFECTION: The  incubation period of COVID-19 is approximately 14 days after exposure, with most symptoms developing in roughly 4-5 days. Symptoms may range in severity from mild to critically severe. Roughly 80% of those infected will have mild symptoms. People of any age may become infected with COVID-19 and have the ability to transmit the virus. The most common symptoms include: fever, fatigue, cough, body aches, headaches, sore throat, nasal congestion, shortness of breath, nausea, vomiting, diarrhea, changes in smell and/or taste.    COURSE OF ILLNESS Some patients may begin with mild disease which can progress quickly into critical symptoms. If your symptoms are worsening please call ahead to the Emergency Department and proceed there for further treatment. Recovery time appears to be roughly 1-2 weeks for mild symptoms and 3-6 weeks for severe disease.   GO IMMEDIATELY TO ER FOR FEVER YOU ARE UNABLE TO GET DOWN WITH TYLENOL, BREATHING PROBLEMS, CHEST PAIN, FATIGUE, LETHARGY, INABILITY TO EAT OR DRINK, ETC  QUARANTINE AND ISOLATION: To help decrease the spread of COVID-19 please remain isolated if you have COVID infection or are highly suspected to have COVID infection. This means -stay home and isolate to one room in the home if you live with others. Do not share a bed or bathroom with others while  ill, sanitize and wipe down all countertops and keep common areas clean and disinfected. Stay home for 5 days. If you have no symptoms or your symptoms are resolving after 5 days, you can leave your house. Continue to wear a mask around others for 5 additional days. If you have been in close contact (within 6 feet) of someone diagnosed with COVID 19, you are advised to quarantine in your home for 14 days as symptoms can develop anywhere from 2-14 days after exposure to the virus. If you develop symptoms, you  must isolate.  Most current guidelines for COVID after exposure -unvaccinated: isolate 5 days and strict mask  use x 5 days. Test on day 5 is possible -vaccinated: wear mask x 10 days if symptoms do not develop -You do not necessarily need to be tested for COVID if you have + exposure and  develop symptoms. Just isolate at home x10 days from symptom onset During this global pandemic, CDC advises to practice social distancing, try to stay at least 29f away from others at all times. Wear a face covering. Wash and sanitize your hands regularly and avoid going anywhere that is not necessary.  KEEP IN MIND THAT THE COVID TEST IS NOT 100% ACCURATE AND YOU SHOULD STILL DO EVERYTHING TO PREVENT POTENTIAL SPREAD OF VIRUS TO OTHERS (WEAR MASK, WEAR GLOVES, [place] AND SANITIZE REGULARLY). IF INITIAL TEST IS NEGATIVE, THIS MAY NOT MEAN YOU ARE DEFINITELY NEGATIVE. MOST ACCURATE TESTING IS DONE 5-7 DAYS AFTER EXPOSURE.   It is not advised by CDC to get re-tested after receiving a positive COVID test since you can still test positive for weeks to months after you have already cleared the virus.   *If you have not been vaccinated for COVID, I strongly suggest you consider getting vaccinated as long as there are no contraindications.       ED Prescriptions     Medication Sig Dispense Auth. Provider   brompheniramine-pseudoephedrine-DM 30-2-10 MG/5ML syrup Take 5 mLs by mouth 4 (four) times daily as needed for up to 7 days. 118 mL ELaurene FootmanB, PA-C   ipratropium (ATROVENT) 0.06 % nasal spray Place 1 spray into both nostrils 4 (four) times daily. 15 mL EDanton Clap PA-C      PDMP not reviewed this encounter.   EDanton Clap PA-C 12/14/20 1539

## 2020-12-14 NOTE — ED Triage Notes (Signed)
Pt took her flu shot on Monday. By Monday night started feeling poorly. Over the next few days developed chills, headache, sore throat and body aches.

## 2020-12-15 LAB — SARS CORONAVIRUS 2 (TAT 6-24 HRS): SARS Coronavirus 2: NEGATIVE

## 2020-12-17 ENCOUNTER — Ambulatory Visit
Admission: EM | Admit: 2020-12-17 | Discharge: 2020-12-17 | Disposition: A | Discharge status: Home / Self Care | Payer: Medicare Other

## 2020-12-17 ENCOUNTER — Other Ambulatory Visit: Payer: Self-pay

## 2020-12-17 DIAGNOSIS — J0100 Acute maxillary sinusitis, unspecified: Secondary | ICD-10-CM | POA: Diagnosis not present

## 2020-12-17 MED ORDER — AMOXICILLIN-POT CLAVULANATE 875-125 MG PO TABS: 1.0000 | ORAL_TABLET | Freq: Two times a day (BID) | ORAL | 0 refills | Status: AC

## 2020-12-17 MED ORDER — BENZONATATE 100 MG PO CAPS: 200.0000 mg | ORAL_CAPSULE | Freq: Three times a day (TID) | ORAL | 0 refills | Status: DC

## 2020-12-17 MED ORDER — GUAIFENESIN-CODEINE 100-10 MG/5ML PO SYRP: 5.0000 mL | ORAL_SOLUTION | Freq: Three times a day (TID) | ORAL | 0 refills | Status: AC | PRN

## 2020-12-17 NOTE — ED Triage Notes (Signed)
Pt c/o continued cough and nasal congestion for over a week. Pt states the cough rx she was given has not helped at all. Pt has been taking Mucinex with some improvement. Pt thinks she may have a sinus infection, also has some matting to both eyes. Pt denies f/n/v/d or other symptoms.

## 2020-12-17 NOTE — ED Provider Notes (Signed)
[facility]    CSN: [id number] Arrival date & time: 12/17/20  0807      History   Chief Complaint Chief Complaint  Patient presents with   Cough   Nasal Congestion    HPI Jade Gardner is a 75 y.o. female.   HPI  32 old female here for reevaluation of respiratory complaints.  Patient reports that for greater than a week she has been experiencing nasal congestion and a cough.  She relates having a low-grade temperature of 99.6 last evening but has not run any true fevers.  She describes a burning in her sinuses and her throat as well as postnasal drip nonproductive cough and body aches.  She denies any nasal discharge, ear pain, shortness breath or wheezing, or GI complaints.  She was evaluated in this urgent care 3 days ago and diagnosed with a viral URI and was treated with Atrovent nasal spray and Bromfed-DM cough syrup at that time.  She reports that the Atrovent nasal spray has helped with the congestion some but the cough medicine has not had any effect on her cough.  Past Medical History:  Diagnosis Date   Diverticulosis    GERD (gastroesophageal reflux disease)    History of echocardiogram    a. 04/2013 Echo: EF 60-65%, no rwma. Mild MR.   Hypercalcemia    Secondar to calcium supplements. Normal PTH   Hypercholesterolemia    Hypertension    controlled on meds   Pneumonia    in past   SVT (supraventricular tachycardia) (HCC)    a. 04/2013 Holter: RSR, PACs. Avg HR 69 (54-93).   TIA (transient ischemic attack)    x2   TIA (transient ischemic attack)    x2   UTI (urinary tract infection)    Vertigo     Patient Active Problem List   Diagnosis Date Noted   Elevated TSH 05/18/2017   Leg cramps 05/18/2017   Hyperlipidemia 11/28/2015   H/O transient cerebral ischemia 05/29/2015   Personal history of other diseases of the musculoskeletal system and connective tissue 05/29/2015   Bronchitis, mucopurulent recurrent ([place]) 05/29/2015   Diverticulosis  08/24/2014   Fibrocystic breast disease 08/24/2014   Anemia 08/23/2014   Allergic rhinitis 08/23/2014   DDD (degenerative disc disease), thoracolumbar 08/23/2014   Hearing loss 08/23/2014   Insomnia 08/23/2014   Osteoporosis 08/23/2014   Overweight (BMI 25.0-29.9) 08/23/2014   Pre-diabetes 08/23/2014   Obstructive sleep apnea 08/23/2014   Varicose veins of both lower extremities without ulcer or inflammation 08/23/2014   B12 deficiency 08/23/2014   Vitamin D deficiency 08/23/2014   Carotid stenosis 09/15/2013   SVT (supraventricular tachycardia) ([place]) 24/40/1027   Nonalcoholic steatohepatitis ([facility]) 05/20/2009   Diaphragmatic hernia 08/10/2008   Cardiac conduction disorder 08/10/2008   Benign essential HTN 08/10/2008   GERD (gastroesophageal reflux disease) 08/10/2008    Past Surgical History:  Procedure Laterality Date   APPENDECTOMY  1954   BREAST CYST ASPIRATION Right    neg   CARDIAC CATHETERIZATION  [id number]   CATARACT EXTRACTION W/PHACO Right 05/26/2017   Procedure: CATARACT EXTRACTION PHACO AND INTRAOCULAR LENS PLACEMENT ([place]) RIGHT;  Surgeon: Leandrew Koyanagi, MD;  Location: [place];  Service: Ophthalmology;  Laterality: Right;   CATARACT EXTRACTION W/PHACO Left 08/18/2017   Procedure: CATARACT EXTRACTION PHACO AND INTRAOCULAR LENS PLACEMENT ([place]) LEFT;  Surgeon: Leandrew Koyanagi, MD;  Location: [place];  Service: Ophthalmology;  Laterality: Left;   CHOLECYSTECTOMY  1981    OB History  No obstetric history on file.      Home Medications    Prior to Admission medications   Medication Sig Start Date End Date Taking? Authorizing Provider  albuterol (VENTOLIN HFA) 108 (90 Base) MCG/ACT inhaler Inhale into the lungs. 06/17/20 06/17/21 Yes [provider]  amoxicillin-clavulanate (AUGMENTIN) 875-125 MG tablet Take 1 tablet by mouth every 12 (twelve) hours for 10 days. 12/17/20 12/27/20 Yes Margarette Canada, NP  Ascorbic Acid  (VITAMIN C) 1000 MG tablet Take 1,000 mg by mouth daily. am   Yes [provider]  benzonatate (TESSALON) 100 MG capsule Take 2 capsules (200 mg total) by mouth every 8 (eight) hours. 12/17/20  Yes Margarette Canada, NP  clopidogrel (PLAVIX) 75 MG tablet TAKE 1 TABLET BY MOUTH  EVERY DAY WITH BREAKFAST 07/27/18  Yes Bacigalupo, Dionne Bucy, MD  Cranberry 500 MG CAPS Take by mouth. am   Yes [provider]  cyclobenzaprine (FLEXERIL) 10 MG tablet Take 0.5-1 tablets (5-10 mg total) by mouth at bedtime as needed for muscle spasms. 06/26/16  Yes Chrismon, Vickki Muff, PA-C  ezetimibe (ZETIA) 10 MG tablet TAKE 1 TABLET BY MOUTH  DAILY 05/06/18  Yes Gollan, Kathlene November, MD  fluticasone (FLONASE) 50 MCG/ACT nasal spray Place 1 spray into both nostrils 2 (two) times daily. 02/12/18  Yes Danton Clap, PA-C  guaiFENesin-codeine (ROBITUSSIN AC) 100-10 MG/5ML syrup Take 5 mLs by mouth 3 (three) times daily as needed for cough. 12/17/20  Yes Margarette Canada, NP  ibandronate (BONIVA) 150 MG tablet TAKE 1 TABLET BY MOUTH ONCE MONTHLY WITH FULL GLASS WATER ON AN EMPTY STOMACH, NO FOOD, DRINK OR MEDICATION 09/23/17  Yes Bacigalupo, Dionne Bucy, MD  ipratropium (ATROVENT) 0.06 % nasal spray Place 1 spray into both nostrils 4 (four) times daily. 12/14/20  Yes Danton Clap, PA-C  meclizine (ANTIVERT) 25 MG tablet Take 1 tablet (25 mg total) by mouth 3 (three) times daily as needed for dizziness. 07/20/17  Yes Bacigalupo, Dionne Bucy, MD  montelukast (SINGULAIR) 10 MG tablet Take 1 tablet (10 mg total) by mouth at bedtime. Please schedule office visit before any future refills 04/20/19  Yes Bacigalupo, Dionne Bucy, MD  vitamin E 180 MG (400 UNITS) capsule daily. 10/01/18  Yes [provider]  carvedilol (COREG) 3.125 MG tablet Take by mouth. 02/02/20 03/03/20  [provider]  metoprolol succinate (TOPROL-XL) 50 MG 24 hr tablet TAKE 1 TABLET BY MOUTH  DAILY 01/20/19 02/17/20  Minna Merritts, MD    Family  History Family History  Problem Relation Age of Onset   Heart disease Father    Dementia Father    Heart disease Mother    Heart disease Brother    Stroke Other    Breast cancer Maternal Aunt 50    Social History Social History   Tobacco Use   Smoking status: Never   Smokeless tobacco: Never  Vaping Use   Vaping Use: Never used  Substance Use Topics   Alcohol use: No   Drug use: No     Allergies   Fish oil and Simvastatin   Review of Systems Review of Systems  Constitutional:  Positive for fever. Negative for activity change and appetite change.  HENT:  Positive for congestion, sinus pressure, sinus pain and sore throat. Negative for ear pain and rhinorrhea.   Respiratory:  Positive for cough. Negative for shortness of breath and wheezing.   Gastrointestinal:  Negative for diarrhea, nausea and vomiting.  Musculoskeletal:  Positive for arthralgias and  myalgias.  Skin:  Negative for rash.  Hematological: Negative.   Psychiatric/Behavioral: Negative.      Physical Exam Triage Vital Signs ED Triage Vitals  Enc Vitals Group     BP 12/17/20 0837 (!) 145/97     Pulse Rate 12/17/20 0837 90     Resp 12/17/20 0837 18     Temp 12/17/20 0837 98.6 F (37 C)     Temp Source 12/17/20 0837 Oral     SpO2 12/17/20 0837 98 %     Weight 12/17/20 0835 152 lb (68.9 kg)     Height 12/17/20 0835 5' 2.5" (1.588 m)     Head Circumference --      Peak Flow --      Pain Score 12/17/20 0835 0     Pain Loc --      Pain Edu? --      Excl. in [place]? --    No data found.  Updated Vital Signs BP (!) 145/97 (BP Location: Left Arm)   Pulse 90   Temp 98.6 F (37 C) (Oral)   Resp 18   Ht 5' 2.5" (1.588 m)   Wt 152 lb (68.9 kg)   SpO2 98%   BMI 27.36 kg/m   Visual Acuity Right Eye Distance:   Left Eye Distance:   Bilateral Distance:    Right Eye Near:   Left Eye Near:    Bilateral Near:     Physical Exam Vitals and nursing note reviewed.  Constitutional:      Appearance:  Normal appearance. She is normal weight. She is ill-appearing.  HENT:     Head: Normocephalic and atraumatic.     Right Ear: Tympanic membrane, ear canal and external ear normal. There is no impacted cerumen.     Left Ear: Tympanic membrane, ear canal and external ear normal. There is no impacted cerumen.     Nose: Congestion and rhinorrhea present.     Mouth/Throat:     Mouth: Mucous membranes are moist.     Pharynx: Oropharynx is clear. Posterior oropharyngeal erythema present.  Cardiovascular:     Rate and Rhythm: Normal rate and regular rhythm.     Pulses: Normal pulses.     Heart sounds: Normal heart sounds. No murmur heard.   No gallop.  Pulmonary:     Effort: Pulmonary effort is normal.     Breath sounds: Normal breath sounds. No wheezing, rhonchi or rales.  Musculoskeletal:     Cervical back: Normal range of motion and neck supple.  Lymphadenopathy:     Cervical: No cervical adenopathy.  Skin:    General: Skin is warm and dry.     Capillary Refill: Capillary refill takes less than 2 seconds.     Findings: No erythema or rash.  Neurological:     General: No focal deficit present.     Mental Status: She is alert and oriented to person, place, and time.  Psychiatric:        Mood and Affect: Mood normal.        Behavior: Behavior normal.        Thought Content: Thought content normal.        Judgment: Judgment normal.     UC Treatments / Results  Labs (all labs ordered are listed, but only abnormal results are displayed) Labs Reviewed - No data to display  EKG   Radiology No results found.  Procedures Procedures (including critical care time)  Medications Ordered in UC Medications -  No data to display  Initial Impression / Assessment and Plan / UC Course  I have reviewed the triage vital signs and the nursing notes.  Pertinent labs & imaging results that were available during my care of the patient were reviewed by me and considered in my medical decision  making (see chart for details).  Patient is a pleasant though ill-appearing 5 old female here for reevaluation of respiratory complaints as outlined in the HPI above.  Patient's physical exam reveals pearly gray tympanic membranes bilaterally with a normal light reflex and clear external auditory canals.  Nasal mucosa is erythematous and edematous with purulent nasal discharge in both nares.  Patient does have tenderness to percussion of bilateral maxillary sinuses with the pain being worse on the right than the left.  Oropharyngeal exam reveals posterior oropharyngeal erythema with yellow postnasal drip.  No cervical lymphadenopathy appreciated on exam.  Cardiopulmonary exam reveals clear lung sounds in all fields.  We will treat patient for maxillary sinusitis with Augmentin twice daily for 10 days and have her continue her Atrovent nasal spray to help with nasal congestion.  I have also coached the patient on sinus irrigation as this can be very helpful to break up any retained mucus in her sinuses and help alleviate the infection and her sinus pressure.  I have also given the patient Ladona Ridgel that she can use during the day for cough and I changed her prescription for Bromfed to Cheratussin for bedtime to help with cough.  Patient vies that if her symptoms do not improve she needs to see her primary care doctor or ear nose and throat.   Final Clinical Impressions(s) / UC Diagnoses   Final diagnoses:  Acute non-recurrent maxillary sinusitis     Discharge Instructions      The Augmentin twice daily with food for 10 days for treatment of your sinusitis.  Perform sinus irrigation 2-3 times a day with a [employer] sinus rinse kit and distilled water.  Do not use tap water.  You can use plain over-the-counter Mucinex every 6 hours to break up the stickiness of the mucus so your body can clear it.  Increase your oral fluid intake to thin out your mucus so that is also able for your body to  clear more easily.  Take an over-the-counter probiotic, such as Culturelle-align-activia, 1 hour after each dose of antibiotic to prevent diarrhea.  Use the Atrovent nasal spray, 2 squirts in each nostril every 6 hours, as needed for runny nose and postnasal drip.  Use the Tessalon Perles every 8 hours during the day.  Take them with a small sip of water.  They may give you some numbness to the base of your tongue or a metallic taste in your mouth, this is normal.  Use the Cheratussin cough syrup at bedtime for cough and congestion.  It will make you drowsy so do not take it during the day.   If you develop any new or worsening symptoms return for reevaluation or see your primary care provider.    ED Prescriptions     Medication Sig Dispense Auth. Provider   amoxicillin-clavulanate (AUGMENTIN) 875-125 MG tablet Take 1 tablet by mouth every 12 (twelve) hours for 10 days. 20 tablet Margarette Canada, NP   benzonatate (TESSALON) 100 MG capsule Take 2 capsules (200 mg total) by mouth every 8 (eight) hours. 21 capsule Margarette Canada, NP   guaiFENesin-codeine (ROBITUSSIN AC) 100-10 MG/5ML syrup Take 5 mLs by mouth 3 (three) times  daily as needed for cough. 120 mL Margarette Canada, NP      I have reviewed the PDMP during this encounter.   Margarette Canada, NP 12/17/20 [phone]

## 2020-12-17 NOTE — Discharge Instructions (Addendum)
The Augmentin twice daily with food for 10 days for treatment of your sinusitis.  Perform sinus irrigation 2-3 times a day with a [employer] sinus rinse kit and distilled water.  Do not use tap water.  You can use plain over-the-counter Mucinex every 6 hours to break up the stickiness of the mucus so your body can clear it.  Increase your oral fluid intake to thin out your mucus so that is also able for your body to clear more easily.  Take an over-the-counter probiotic, such as Culturelle-align-activia, 1 hour after each dose of antibiotic to prevent diarrhea.  Use the Atrovent nasal spray, 2 squirts in each nostril every 6 hours, as needed for runny nose and postnasal drip.  Use the Tessalon Perles every 8 hours during the day.  Take them with a small sip of water.  They may give you some numbness to the base of your tongue or a metallic taste in your mouth, this is normal.  Use the Cheratussin cough syrup at bedtime for cough and congestion.  It will make you drowsy so do not take it during the day.   If you develop any new or worsening symptoms return for reevaluation or see your primary care provider.

## 2020-12-23 ENCOUNTER — Ambulatory Visit: Payer: Medicare Other | Admitting: Cardiovascular Disease

## 2020-12-28 ENCOUNTER — Ambulatory Visit
Admission: EM | Admit: 2020-12-28 | Discharge: 2020-12-28 | Disposition: A | Discharge status: Home / Self Care | Payer: Medicare Other | Attending: Physician Assistant | Admitting: Physician Assistant

## 2020-12-28 ENCOUNTER — Encounter: Payer: Self-pay | Admitting: Emergency Medicine

## 2020-12-28 ENCOUNTER — Other Ambulatory Visit: Payer: Self-pay

## 2020-12-28 DIAGNOSIS — R051 Acute cough: Secondary | ICD-10-CM | POA: Diagnosis not present

## 2020-12-28 DIAGNOSIS — H1031 Unspecified acute conjunctivitis, right eye: Secondary | ICD-10-CM | POA: Diagnosis not present

## 2020-12-28 MED ORDER — MOXIFLOXACIN HCL 0.5 % OP SOLN: 1.0000 [drp] | Freq: Three times a day (TID) | OPHTHALMIC | 0 refills | Status: AC

## 2020-12-28 NOTE — ED Provider Notes (Signed)
[facility]    CSN: [id number] Arrival date & time: 12/28/20  1440      History   Chief Complaint Chief Complaint  Patient presents with   Eye Problem    HPI Jade Gardner is a 75 y.o. female presenting for 2-day history of redness and itching of the right eye with irritation.  Patient says today she woke up and she had yellow drainage and crusting of the eye.  The left eye started to itch today as well.  Patient diagnosed with sinus infection 11 days ago and treated with 10-day course of Augmentin which she just completed.  She admits to continued cough that is the worst at night.  Has been taking Cheratussin for the cough at night which she says does help.  She reports improvement in the cough and congestion.  No worsening of symptoms.  No fevers, sinus pain or breathing difficulty.  Patient concerned about possible pinkeye at this time.  No other complaints.  HPI  Past Medical History:  Diagnosis Date   Diverticulosis    GERD (gastroesophageal reflux disease)    History of echocardiogram    a. 04/2013 Echo: EF 60-65%, no rwma. Mild MR.   Hypercalcemia    Secondar to calcium supplements. Normal PTH   Hypercholesterolemia    Hypertension    controlled on meds   Pneumonia    in past   SVT (supraventricular tachycardia) (HCC)    a. 04/2013 Holter: RSR, PACs. Avg HR 69 (54-93).   TIA (transient ischemic attack)    x2   TIA (transient ischemic attack)    x2   UTI (urinary tract infection)    Vertigo     Patient Active Problem List   Diagnosis Date Noted   Elevated TSH 05/18/2017   Leg cramps 05/18/2017   Hyperlipidemia 11/28/2015   H/O transient cerebral ischemia 05/29/2015   Personal history of other diseases of the musculoskeletal system and connective tissue 05/29/2015   Bronchitis, mucopurulent recurrent ([place]) 05/29/2015   Diverticulosis 08/24/2014   Fibrocystic breast disease 08/24/2014   Anemia 08/23/2014   Allergic rhinitis 08/23/2014   DDD  (degenerative disc disease), thoracolumbar 08/23/2014   Hearing loss 08/23/2014   Insomnia 08/23/2014   Osteoporosis 08/23/2014   Overweight (BMI 25.0-29.9) 08/23/2014   Pre-diabetes 08/23/2014   Obstructive sleep apnea 08/23/2014   Varicose veins of both lower extremities without ulcer or inflammation 08/23/2014   B12 deficiency 08/23/2014   Vitamin D deficiency 08/23/2014   Carotid stenosis 09/15/2013   SVT (supraventricular tachycardia) ([place]) 95/63/8756   Nonalcoholic steatohepatitis (NASH) 05/20/2009   Diaphragmatic hernia 08/10/2008   Cardiac conduction disorder 08/10/2008   Benign essential HTN 08/10/2008   GERD (gastroesophageal reflux disease) 08/10/2008    Past Surgical History:  Procedure Laterality Date   APPENDECTOMY  1954   BREAST CYST ASPIRATION Right    neg   CARDIAC CATHETERIZATION  4332&9518   CATARACT EXTRACTION W/PHACO Right 05/26/2017   Procedure: CATARACT EXTRACTION PHACO AND INTRAOCULAR LENS PLACEMENT ([place]) RIGHT;  Surgeon: Leandrew Koyanagi, MD;  Location: [place];  Service: Ophthalmology;  Laterality: Right;   CATARACT EXTRACTION W/PHACO Left 08/18/2017   Procedure: CATARACT EXTRACTION PHACO AND INTRAOCULAR LENS PLACEMENT ([place]) LEFT;  Surgeon: Leandrew Koyanagi, MD;  Location: [place];  Service: Ophthalmology;  Laterality: Left;   CHOLECYSTECTOMY  1981    OB History   No obstetric history on file.      Home Medications    Prior to Admission medications  Medication Sig Start Date End Date Taking? Authorizing Provider  albuterol (VENTOLIN HFA) 108 (90 Base) MCG/ACT inhaler Inhale into the lungs. 06/17/20 06/17/21 Yes [provider]  Ascorbic Acid (VITAMIN C) 1000 MG tablet Take 1,000 mg by mouth daily. am   Yes [provider]  clopidogrel (PLAVIX) 75 MG tablet TAKE 1 TABLET BY MOUTH  EVERY DAY WITH BREAKFAST 07/27/18  Yes Bacigalupo, Dionne Bucy, MD  Cranberry 500 MG CAPS Take by mouth. am   Yes [provider]  cyclobenzaprine (FLEXERIL) 10 MG tablet Take 0.5-1 tablets (5-10 mg total) by mouth at bedtime as needed for muscle spasms. 06/26/16  Yes Chrismon, Vickki Muff, PA-C  ezetimibe (ZETIA) 10 MG tablet TAKE 1 TABLET BY MOUTH  DAILY 05/06/18  Yes Gollan, Kathlene November, MD  fluticasone (FLONASE) 50 MCG/ACT nasal spray Place 1 spray into both nostrils 2 (two) times daily. 02/12/18  Yes Laurene Footman B, PA-C  ibandronate (BONIVA) 150 MG tablet TAKE 1 TABLET BY MOUTH ONCE MONTHLY WITH FULL GLASS WATER ON AN EMPTY STOMACH, NO FOOD, DRINK OR MEDICATION 09/23/17  Yes Bacigalupo, Dionne Bucy, MD  ipratropium (ATROVENT) 0.06 % nasal spray Place 1 spray into both nostrils 4 (four) times daily. 12/14/20  Yes Danton Clap, PA-C  meclizine (ANTIVERT) 25 MG tablet Take 1 tablet (25 mg total) by mouth 3 (three) times daily as needed for dizziness. 07/20/17  Yes Bacigalupo, Dionne Bucy, MD  montelukast (SINGULAIR) 10 MG tablet Take 1 tablet (10 mg total) by mouth at bedtime. Please schedule office visit before any future refills 04/20/19  Yes Bacigalupo, Dionne Bucy, MD  moxifloxacin (VIGAMOX) 0.5 % ophthalmic solution Place 1 drop into the right eye 3 (three) times daily for 7 days. 12/28/20 01/04/21 Yes Danton Clap, PA-C  vitamin E 180 MG (400 UNITS) capsule daily. 10/01/18  Yes [provider]  benzonatate (TESSALON) 100 MG capsule Take 2 capsules (200 mg total) by mouth every 8 (eight) hours. 12/17/20   Margarette Canada, NP  carvedilol (COREG) 3.125 MG tablet Take by mouth. 02/02/20 12/28/20  [provider]  guaiFENesin-codeine (ROBITUSSIN AC) 100-10 MG/5ML syrup Take 5 mLs by mouth 3 (three) times daily as needed for cough. 12/17/20   Margarette Canada, NP  metoprolol succinate (TOPROL-XL) 50 MG 24 hr tablet TAKE 1 TABLET BY MOUTH  DAILY 01/20/19 02/17/20  Minna Merritts, MD    Family History Family History  Problem Relation Age of Onset   Heart disease Father    Dementia Father    Heart disease  Mother    Heart disease Brother    Stroke Other    Breast cancer Maternal Aunt 50    Social History Social History   Tobacco Use   Smoking status: Never   Smokeless tobacco: Never  Vaping Use   Vaping Use: Never used  Substance Use Topics   Alcohol use: No   Drug use: No     Allergies   Fish oil and Simvastatin   Review of Systems Review of Systems  Constitutional:  Negative for chills, diaphoresis, fatigue and fever.  HENT:  Positive for congestion. Negative for ear pain, rhinorrhea, sinus pressure, sinus pain and sore throat.   Eyes:  Positive for discharge, redness and itching. Negative for photophobia, pain and visual disturbance.  Respiratory:  Positive for cough. Negative for shortness of breath.   Gastrointestinal:  Negative for abdominal pain, nausea and vomiting.  Musculoskeletal:  Negative for arthralgias and myalgias.  Skin:  Negative for  rash.  Neurological:  Negative for weakness and headaches.  Hematological:  Negative for adenopathy.    Physical Exam Triage Vital Signs ED Triage Vitals  Enc Vitals Group     BP      Pulse      Resp      Temp      Temp src      SpO2      Weight      Height      Head Circumference      Peak Flow      Pain Score      Pain Loc      Pain Edu?      Excl. in [place]?    No data found.  Updated Vital Signs BP 129/65 (BP Location: Right Arm)   Pulse 60   Temp 97.8 F (36.6 C) (Oral)   Resp 18   Ht 5' 2.5" (1.588 m)   Wt 151 lb 14.4 oz (68.9 kg)   SpO2 97%   BMI 27.34 kg/m      Physical Exam Vitals and nursing note reviewed.  Constitutional:      General: She is not in acute distress.    Appearance: Normal appearance. She is not ill-appearing or toxic-appearing.  HENT:     Head: Normocephalic and atraumatic.     Nose: Congestion (very mild) present.     Mouth/Throat:     Mouth: Mucous membranes are moist.     Pharynx: Oropharynx is clear.  Eyes:     General: Lids are normal. No scleral icterus.        Right eye: No discharge.        Left eye: No discharge.     Conjunctiva/sclera:     Right eye: Right conjunctiva is injected.  Cardiovascular:     Rate and Rhythm: Normal rate and regular rhythm.     Heart sounds: Normal heart sounds.  Pulmonary:     Effort: Pulmonary effort is normal. No respiratory distress.     Breath sounds: Normal breath sounds.  Musculoskeletal:     Cervical back: Neck supple.  Skin:    General: Skin is dry.  Neurological:     General: No focal deficit present.     Mental Status: She is alert. Mental status is at baseline.     Motor: No weakness.     Gait: Gait normal.  Psychiatric:        Mood and Affect: Mood normal.        Behavior: Behavior normal.        Thought Content: Thought content normal.     UC Treatments / Results  Labs (all labs ordered are listed, but only abnormal results are displayed) Labs Reviewed - No data to display  EKG   Radiology No results found.  Procedures Procedures (including critical care time)  Medications Ordered in UC Medications - No data to display  Initial Impression / Assessment and Plan / UC Course  I have reviewed the triage vital signs and the nursing notes.  Pertinent labs & imaging results that were available during my care of the patient were reviewed by me and considered in my medical decision making (see chart for details).   75 year old female presenting for redness, irritation and itching of the right eye for 2 days.  She also reports yellowish drainage and crusting.  Patient recently treated for sinus infection with Augmentin 10-day course which she finished yesterday.  Admits to mild cough and congestion  which is much improved from onset.  No fever or breathing difficulty.  Today she does have injection of the right conjunctive a without any obvious drainage.  Normal-appearing left eye.  Advised patient symptoms are consistent with conjunctivitis which could be viral, allergic or bacterial.  Will  cover for possible bacterial causes.  I have sent in Vigamox.  Advised her to continue her at home cough medication but to follow-up if she develops a fever, chest pain, breathing difficulty or her cough and congestion seems to worsen.   Final Clinical Impressions(s) / UC Diagnoses   Final diagnoses:  Acute bacterial conjunctivitis of right eye  Acute cough     Discharge Instructions      -You have pink eye. It could be bacterial so I have sent antibiotic eye drops to pharmacy. -You could be contagious to others to make sure you are practicing good hand hygiene.  If you feel that your symptoms are worsening over the next few days or have not improved after about 5 days you should be seen again.  Try not to touch your eyes -Your vitals are stable today and your lungs are clear but if you were to develop a fever or worsening cough or breathing difficulty you should be seen again and reevaluated for possible pneumonia.  As we discussed, sometimes cough can be persistent with viral illnesses and sinus infections but every day should be improving.     ED Prescriptions     Medication Sig Dispense Auth. Provider   moxifloxacin (VIGAMOX) 0.5 % ophthalmic solution Place 1 drop into the right eye 3 (three) times daily for 7 days. 3 mL Danton Clap, PA-C      PDMP not reviewed this encounter.   Danton Clap, PA-C 12/28/20 1542

## 2020-12-28 NOTE — Discharge Instructions (Signed)
-  You have pink eye. It could be bacterial so I have sent antibiotic eye drops to pharmacy. -You could be contagious to others to make sure you are practicing good hand hygiene.  If you feel that your symptoms are worsening over the next few days or have not improved after about 5 days you should be seen again.  Try not to touch your eyes -Your vitals are stable today and your lungs are clear but if you were to develop a fever or worsening cough or breathing difficulty you should be seen again and reevaluated for possible pneumonia.  As we discussed, sometimes cough can be persistent with viral illnesses and sinus infections but every day should be improving.

## 2020-12-28 NOTE — ED Triage Notes (Signed)
Pt c/o right eye redness, matting, and itchy. Started about 2 days ago. She states she woke up with morning and her eye were matted.

## 2021-01-13 NOTE — Progress Notes (Signed)
Cardiology Office Note  Date:  01/14/2021   ID:  Jade Gardner, DOB 14-Jan-1946, MRN [id number]  PCP:  Jade Orleans, MD   Chief Complaint  Patient presents with   12 month follow up     Patient c/o having fatigue, cough, chest tightness and a scratchy throat since she got the Flu shot on Oct. 10th. Medications reviewed by the patient verbally.     HPI:  [name] is a very pleasant 75 year old woman with a history of  SVT,  TIA in 2035 of uncertain etiology,   H/A,  hypertension,  hyperlipidemia,  reaction to simvastatin/Crestor/Lipitor, [name],   cardiac catheterization in 05/08/89 showing no significant coronary artery disease  who presents for routine followup of her SVT and high cholesterol, hypertension, PAD 40% bilateral carotid arterial disease  Last seen by myself in clinic October 2020 Seen by one of our providers October 2021  Cough and malaise after "flu shot" Coughed "all night long" Started on cough syrup  Metoprolol changed to coreg by [facility] MD Perhaps for cirrhosis/portal hypertension  Bp at home 112/60  Not on Repatha   HBA1c 5.4 Total chol  213, LDL 104,  EKG personally reviewed by myself on todays visit Shows normal sinus rhythm with rate 82 bpm, no significant ST or T wave changes noted   Other past medical history  Previous TIA on 03/24/2013. She had acute onset of slurred speech, headache, dizziness, blurred vision, numbness on her for head. She went to the emergency room and blood pressure was 200/100. She stayed most of the day and was discharged home. TIA happened while on low-dose aspirin   CT scan of the head showed no stroke MRI of the head showed no acute stroke Lab work is essentially normal including basic metabolic panel and CBC   echocardiogram done that showed no ASD or PFO, otherwise normal study Carotid ultrasound showed bilateral 40-59% carotid arterial disease Holter monitor showed one short run of atrial tachycardia/SVT 7  beats, rare APCs   She had a difficult year May 08, 2012  as her husband passed away last year from a reaction to blood transfusion. She has had some problems adjusting, insomnia, increasing her weight. She has a supportive family and is trying to stay active.  For SVT, she has tried Cardizem in the past but had fatigue   Echocardiogram May 2006 showing mild LVH, mild MR, moderate TR, biventricular systolic pressure 46 mmHg with mild pulmonary hypertension   Perfusion stress test January 2006 showing normal ejection fraction, poor exercise tolerance on Bruce protocol for 3 minutes 30 seconds, or Apical septal ischemia   Significant family history with brother with bypass surgery, father with bypass surgery at age 59, mother with stroke and heart attack in her 1s    PMH:   has a past medical history of Diverticulosis, GERD (gastroesophageal reflux disease), History of echocardiogram, Hypercalcemia, Hypercholesterolemia, Hypertension, Pneumonia, SVT (supraventricular tachycardia) ([place]), TIA (transient ischemic attack), TIA (transient ischemic attack), UTI (urinary tract infection), and Vertigo.  PSH:    Past Surgical History:  Procedure Laterality Date   APPENDECTOMY  1954   BREAST CYST ASPIRATION Right    neg   CARDIAC CATHETERIZATION  1990&1998   CATARACT EXTRACTION W/PHACO Right 05/26/2017   Procedure: CATARACT EXTRACTION PHACO AND INTRAOCULAR LENS PLACEMENT ([place]) RIGHT;  Surgeon: Jade Koyanagi, MD;  Location: [place];  Service: Ophthalmology;  Laterality: Right;   CATARACT EXTRACTION W/PHACO Left 08/18/2017   Procedure: CATARACT EXTRACTION PHACO AND INTRAOCULAR LENS PLACEMENT (  [facility]) LEFT;  Surgeon: Jade Koyanagi, MD;  Location: [facility];  Service: Ophthalmology;  Laterality: Left;   CHOLECYSTECTOMY  1981    Current Outpatient Medications  Medication Sig Dispense Refill   albuterol (VENTOLIN HFA) 108 (90 Base) MCG/ACT inhaler Inhale into the lungs.      Ascorbic Acid (VITAMIN C) 1000 MG tablet Take 1,000 mg by mouth daily. am     benzonatate (TESSALON) 100 MG capsule Take 2 capsules (200 mg total) by mouth every 8 (eight) hours. 21 capsule 0   carvedilol (COREG) 3.125 MG tablet Take 3.125 mg by mouth 2 (two) times daily with a meal.     clopidogrel (PLAVIX) 75 MG tablet TAKE 1 TABLET BY MOUTH  EVERY DAY WITH BREAKFAST 90 tablet 1   Cranberry 500 MG CAPS Take by mouth. am     cyclobenzaprine (FLEXERIL) 10 MG tablet Take 0.5-1 tablets (5-10 mg total) by mouth at bedtime as needed for muscle spasms. 90 tablet 1   ezetimibe (ZETIA) 10 MG tablet TAKE 1 TABLET BY MOUTH  DAILY 90 tablet 3   fluticasone (FLONASE) 50 MCG/ACT nasal spray Place 1 spray into both nostrils 2 (two) times daily. 1 g 0   guaiFENesin-codeine (ROBITUSSIN AC) 100-10 MG/5ML syrup Take 5 mLs by mouth 3 (three) times daily as needed for cough. 120 mL 0   ibandronate (BONIVA) 150 MG tablet TAKE 1 TABLET BY MOUTH ONCE MONTHLY WITH FULL GLASS WATER ON AN EMPTY STOMACH, NO FOOD, DRINK OR MEDICATION 1 tablet 11   ipratropium (ATROVENT) 0.06 % nasal spray Place 1 spray into both nostrils 4 (four) times daily. 15 mL 0   meclizine (ANTIVERT) 25 MG tablet Take 1 tablet (25 mg total) by mouth 3 (three) times daily as needed for dizziness. 60 tablet 0   montelukast (SINGULAIR) 10 MG tablet Take 1 tablet (10 mg total) by mouth at bedtime. Please schedule office visit before any future refills 30 tablet 0   omeprazole (PRILOSEC) 40 MG capsule Take 40 mg by mouth daily.     predniSONE (DELTASONE) 20 MG tablet Take 1 tablet by mouth daily.     vitamin E 180 MG (400 UNITS) capsule Take 400 Units by mouth daily.     No current facility-administered medications for this visit.     Allergies:   Fish oil and Simvastatin   Social History:  The patient  reports that she has never smoked. She has never used smokeless tobacco. She reports that she does not drink alcohol and does not use drugs.   Family  History:   family history includes Breast cancer (age of onset: 59) in her maternal aunt; Dementia in her father; Heart disease in her brother, father, and mother; Stroke in an other family member.    Review of Systems: Review of Systems  Constitutional: Negative.   HENT: Negative.    Respiratory: Negative.    Cardiovascular: Negative.        Arm pain  Gastrointestinal: Negative.   Musculoskeletal: Negative.   Neurological: Negative.   Psychiatric/Behavioral: Negative.    All other systems reviewed and are negative.   PHYSICAL EXAM: VS:  BP (!) 150/72 (BP Location: Left Arm, Patient Position: Sitting, Cuff Size: Normal)   Pulse 82   Ht 5' 2.5" (1.588 m)   Wt 150 lb (68 kg)   SpO2 98%   BMI 27.00 kg/m  , BMI Body mass index is 27 kg/m. Constitutional:  oriented to person, place, and time. No distress.  HENT:  Head: Grossly normal Eyes:  no discharge. No scleral icterus.  Neck: No JVD, no carotid bruits  Cardiovascular: Regular rate and rhythm, no murmurs appreciated Pulmonary/Chest: Clear to auscultation bilaterally, no wheezes or rails Abdominal: Soft.  no distension.  no tenderness.  Musculoskeletal: Normal range of motion Neurological:  normal muscle tone. Coordination normal. No atrophy Skin: Skin warm and dry Psychiatric: normal affect, pleasant  Recent Labs: No results found for requested labs within last 8760 hours.    Lipid Panel Lab Results  Component Value Date   CHOL 212 (H) 10/09/2019   HDL 45 10/09/2019   LDLCALC 128 (H) 10/09/2019   TRIG 222 (H) 10/09/2019      Wt Readings from Last 3 Encounters:  01/14/21 150 lb (68 kg)  12/28/20 151 lb 14.4 oz (68.9 kg)  12/17/20 152 lb (68.9 kg)     ASSESSMENT AND PLAN:  Hypertension, essential, benign -  Elevated , but stress today Recommend she monitor blood pressure at home and call us if this runs high  SVT (supraventricular tachycardia) (HCC) -  Asymptomatic, denies significant arrhythmia On  coreg  H/O transient cerebral ischemia -  No recent neurologic type events concerning for TIA or stroke On Plavix  Carotid artery disease, unspecified laterality [facility]) -   April 2018 reviewed with her in detail showing less than 39% disease bilaterally  Mixed hyperlipidemia Does not want medication at this time  Nonalcoholic steatohepatitis (NASH) Secondary to obesity,  Told she has cirrhosis by [facility]    Total encounter time more than 25 minutes  Greater than 50% was spent in counseling and coordination of care with the patient    Orders Placed This Encounter  Procedures   EKG 12-Lead     Signed, Esmond Plants, M.D., Ph.D. 01/14/2021  [facility] [place], [place] [phone]

## 2021-01-14 ENCOUNTER — Ambulatory Visit: Payer: Medicare Other | Admitting: Cardiovascular Disease

## 2021-01-14 ENCOUNTER — Other Ambulatory Visit: Payer: Self-pay

## 2021-01-14 ENCOUNTER — Encounter: Payer: Self-pay | Admitting: Cardiovascular Disease

## 2021-01-14 VITALS — BP 150/72 | HR 82 | Ht 62.5 in | Wt 150.0 lb

## 2021-01-14 DIAGNOSIS — E785 Hyperlipidemia, unspecified: Secondary | ICD-10-CM

## 2021-01-14 DIAGNOSIS — I739 Peripheral vascular disease, unspecified: Secondary | ICD-10-CM | POA: Diagnosis not present

## 2021-01-14 DIAGNOSIS — I779 Disorder of arteries and arterioles, unspecified: Secondary | ICD-10-CM

## 2021-01-14 DIAGNOSIS — I471 Supraventricular tachycardia: Secondary | ICD-10-CM | POA: Diagnosis not present

## 2021-01-14 DIAGNOSIS — I10 Essential (primary) hypertension: Secondary | ICD-10-CM

## 2021-01-14 DIAGNOSIS — Z8673 Personal history of transient ischemic attack (TIA), and cerebral infarction without residual deficits: Secondary | ICD-10-CM | POA: Diagnosis not present

## 2021-01-14 MED ORDER — EZETIMIBE 10 MG PO TABS: 10.0000 mg | ORAL_TABLET | Freq: Every day | ORAL | 3 refills | Status: DC

## 2021-01-14 MED ORDER — FUROSEMIDE 20 MG PO TABS: 20.0000 mg | ORAL_TABLET | Freq: Every day | ORAL | 3 refills | Status: DC | PRN

## 2021-01-14 MED ORDER — POTASSIUM CHLORIDE ER 10 MEQ PO TBCR: 10.0000 meq | EXTENDED_RELEASE_TABLET | Freq: Every day | ORAL | 3 refills | Status: DC | PRN

## 2021-01-14 MED ORDER — CARVEDILOL 3.125 MG PO TABS: 3.1250 mg | ORAL_TABLET | Freq: Two times a day (BID) | ORAL | 3 refills | Status: DC

## 2021-01-14 NOTE — Patient Instructions (Addendum)
Medication Instructions:  Lasix 20 mg  daily as needed For swelling or shortness of breath Potassium 10 meq  Take with lasix as needed  If you need a refill on your cardiac medications before your next appointment, please call your pharmacy.   Lab work: No new labs needed  Testing/Procedures: Call if you would like a CT coronary calcium score, $99   Follow-Up: At [facility], you and your health needs are our priority.  As part of our continuing mission to provide you with exceptional heart care, we have created designated Provider Care Teams.  These Care Teams include your primary Cardiologist (physician) and Advanced Practice Providers (APPs -  Physician Assistants and Nurse Practitioners) who all work together to provide you with the care you need, when you need it.  You will need a follow up appointment in 12 months  Providers on your designated Care Team:   Murray Hodgkins, NP Christell Faith, PA-C Cadence Kathlen Mody, [place]  COVID-19 Vaccine Information can be found at: [email or web address] For questions related to vaccine distribution or appointments, please email [email or web address] or call [phone].

## 2022-01-20 ENCOUNTER — Other Ambulatory Visit: Payer: Self-pay

## 2022-01-20 MED ORDER — CARVEDILOL 3.125 MG PO TABS: 3.1250 mg | ORAL_TABLET | Freq: Two times a day (BID) | ORAL | 0 refills | Status: DC

## 2022-02-02 NOTE — Progress Notes (Unsigned)
Cardiology Office Note  Date:  02/03/2022   ID:  ALGA Jade Gardner, DOB [birth year], MRN [id number]  PCP:  Danae Orleans, MD   Chief Complaint  Patient presents with   12 month follow up     Patient c/o chest discomfort and upper back pain & feeling tired and exhausted but has been helping with family members. Medications reviewed by the patient verbally.     HPI:  [name] is a very pleasant 76 year old woman with a history of  SVT,  TIA in 0017 of uncertain etiology,   H/A,  hypertension,  hyperlipidemia,  reaction to simvastatin/Crestor/Lipitor, [name],   Moderate aortic atherosclerosis on CT cardiac catheterization in 1989-05-04 showing no significant coronary artery disease  who presents for routine followup of her SVT and high cholesterol, hypertension, PAD 40% bilateral carotid arterial disease  Last seen by myself in clinic November 2022  Stress at home, sister in law with dementia/alzheiners Brother also with dementia She is sitting for long periods of time with him, over the past 6 weeks has been staying at their house to help them  Pulled muscle in chest, lifting bins, christmas stuff Does not feel it is anginal symptoms  Maybe selling house to move in with daughter  Currently not taking Zetia or Repatha CT scan abdomen May 2017, images pulled up and reviewed  concerning for cirrhosis, moderate diffuse aortic atherosclerosis  Lab work reviewed HBA1c 5.4 Total chol 217, LDL 140  EKG personally reviewed by myself on todays visit Shows normal sinus rhythm with rate 64 bpm, no significant ST or T wave changes noted   Other past medical history  Previous TIA on 03/24/2013. She had acute onset of slurred speech, headache, dizziness, blurred vision, numbness on her for head. She went to the emergency room and blood pressure was 200/100. She stayed most of the day and was discharged home. TIA happened while on low-dose aspirin   CT scan of the head showed no  stroke MRI of the head showed no acute stroke Lab work is essentially normal including basic metabolic panel and CBC   echocardiogram done that showed no ASD or PFO, otherwise normal study Carotid ultrasound showed bilateral 40-59% carotid arterial disease Holter monitor showed one short run of atrial tachycardia/SVT 7 beats, rare APCs   She had a difficult year 2012-05-04  as her husband passed away last year from a reaction to blood transfusion. She has had some problems adjusting, insomnia, increasing her weight. She has a supportive family and is trying to stay active.  For SVT, she has tried Cardizem in the past but had fatigue   Echocardiogram May 2006 showing mild LVH, mild MR, moderate TR, biventricular systolic pressure 46 mmHg with mild pulmonary hypertension   Perfusion stress test January 2006 showing normal ejection fraction, poor exercise tolerance on Bruce protocol for 3 minutes 30 seconds, or Apical septal ischemia   Significant family history with brother with bypass surgery, father with bypass surgery at age 48, mother with stroke and heart attack in her 5s    PMH:   has a past medical history of Diverticulosis, GERD (gastroesophageal reflux disease), History of echocardiogram, Hypercalcemia, Hypercholesterolemia, Hypertension, Pneumonia, SVT (supraventricular tachycardia), TIA (transient ischemic attack), TIA (transient ischemic attack), UTI (urinary tract infection), and Vertigo.  PSH:    Past Surgical History:  Procedure Laterality Date   APPENDECTOMY  1954   BREAST CYST ASPIRATION Right    neg   CARDIAC CATHETERIZATION  [id number]   CATARACT EXTRACTION  W/PHACO Right 05/26/2017   Procedure: CATARACT EXTRACTION PHACO AND INTRAOCULAR LENS PLACEMENT ([place]) RIGHT;  Surgeon: Leandrew Koyanagi, MD;  Location: [facility];  Service: Ophthalmology;  Laterality: Right;   CATARACT EXTRACTION W/PHACO Left 08/18/2017   Procedure: CATARACT EXTRACTION PHACO AND INTRAOCULAR LENS  PLACEMENT ([place]) LEFT;  Surgeon: Leandrew Koyanagi, MD;  Location: [facility];  Service: Ophthalmology;  Laterality: Left;   CHOLECYSTECTOMY  1981    Current Outpatient Medications  Medication Sig Dispense Refill   Ascorbic Acid (VITAMIN C) 1000 MG tablet Take 1,000 mg by mouth daily. am     benzonatate (TESSALON) 100 MG capsule Take 2 capsules (200 mg total) by mouth every 8 (eight) hours. 21 capsule 0   carvedilol (COREG) 3.125 MG tablet Take 1 tablet (3.125 mg total) by mouth 2 (two) times daily with a meal. 60 tablet 0   clopidogrel (PLAVIX) 75 MG tablet TAKE 1 TABLET BY MOUTH  EVERY DAY WITH BREAKFAST 90 tablet 1   Cranberry 500 MG CAPS Take by mouth. am     cyclobenzaprine (FLEXERIL) 10 MG tablet Take 0.5-1 tablets (5-10 mg total) by mouth at bedtime as needed for muscle spasms. 90 tablet 1   ezetimibe (ZETIA) 10 MG tablet Take 1 tablet (10 mg total) by mouth daily. 90 tablet 3   fluticasone (FLONASE) 50 MCG/ACT nasal spray Place 1 spray into both nostrils 2 (two) times daily. 1 g 0   furosemide (LASIX) 20 MG tablet Take 1 tablet (20 mg total) by mouth daily as needed for fluid (swelling and for shortness of breath). 90 tablet 3   guaiFENesin-codeine (ROBITUSSIN AC) 100-10 MG/5ML syrup Take 5 mLs by mouth 3 (three) times daily as needed for cough. 120 mL 0   ibandronate (BONIVA) 150 MG tablet TAKE 1 TABLET BY MOUTH ONCE MONTHLY WITH FULL GLASS WATER ON AN EMPTY STOMACH, NO FOOD, DRINK OR MEDICATION 1 tablet 11   ipratropium (ATROVENT) 0.06 % nasal spray Place 1 spray into both nostrils 4 (four) times daily. 15 mL 0   meclizine (ANTIVERT) 25 MG tablet Take 1 tablet (25 mg total) by mouth 3 (three) times daily as needed for dizziness. 60 tablet 0   montelukast (SINGULAIR) 10 MG tablet Take 1 tablet (10 mg total) by mouth at bedtime. Please schedule office visit before any future refills 30 tablet 0   omeprazole (PRILOSEC) 40 MG capsule TAKE 1 CAPSULE BY MOUTH AS  NEEDED IN THE  MORNING     potassium chloride (KLOR-CON) 10 MEQ tablet Take 1 tablet (10 mEq total) by mouth daily as needed. Take with lasix 90 tablet 3   vitamin E 180 MG (400 UNITS) capsule Take 400 Units by mouth daily.     predniSONE (DELTASONE) 20 MG tablet Take 1 tablet by mouth daily. (Patient not taking: Reported on 02/03/2022)     No current facility-administered medications for this visit.     Allergies:   Fish oil and Simvastatin   Social History:  The patient  reports that she has never smoked. She has never used smokeless tobacco. She reports that she does not drink alcohol and does not use drugs.   Family History:   family history includes Breast cancer (age of onset: 47) in her maternal aunt; Dementia in her father; Heart disease in her brother, father, and mother; Stroke in an other family member.    Review of Systems: Review of Systems  Constitutional: Negative.   HENT: Negative.    Respiratory: Negative.  Cardiovascular:  Positive for chest pain.  Gastrointestinal: Negative.   Musculoskeletal: Negative.   Neurological: Negative.   Psychiatric/Behavioral: Negative.    All other systems reviewed and are negative.    PHYSICAL EXAM: VS:  BP 110/62 (BP Location: Left Arm, Patient Position: Sitting, Cuff Size: Normal)   Pulse 64   Ht 5' 2.5" (1.588 m)   Wt 152 lb (68.9 kg)   SpO2 98%   BMI 27.36 kg/m  , BMI Body mass index is 27.36 kg/m. Constitutional:  oriented to person, place, and time. No distress.  HENT:  Head: Grossly normal Eyes:  no discharge. No scleral icterus.  Neck: No JVD, no carotid bruits  Cardiovascular: Regular rate and rhythm, no murmurs appreciated Pulmonary/Chest: Clear to auscultation bilaterally, no wheezes or rails Abdominal: Soft.  no distension.  no tenderness.  Musculoskeletal: Normal range of motion Neurological:  normal muscle tone. Coordination normal. No atrophy Skin: Skin warm and dry Psychiatric: normal affect, pleasant   Recent  Labs: No results found for requested labs within last 365 days.    Lipid Panel Lab Results  Component Value Date   CHOL 212 (H) 10/09/2019   HDL 45 10/09/2019   LDLCALC 128 (H) 10/09/2019   TRIG 222 (H) 10/09/2019      Wt Readings from Last 3 Encounters:  02/03/22 152 lb (68.9 kg)  01/14/21 150 lb (68 kg)  12/28/20 151 lb 14.4 oz (68.9 kg)     ASSESSMENT AND PLAN:  Hypertension, essential, benign -  Blood pressure is well controlled on today's visit. No changes made to the medications.  SVT (supraventricular tachycardia) (HCC) -  Asymptomatic, denies significant arrhythmia Continue current dose carvedilol  H/O transient cerebral ischemia -  No recent neurologic type events concerning for TIA or stroke Continue Plavix  Low platelets Slow trend down in the past 3 years most recently 120s  Carotid artery disease, unspecified laterality [facility]) -   April 2018 reviewed with her in detail showing less than 39% disease bilaterally  Mixed hyperlipidemia Recommend she start Zetia, Crestor 5 mg 3 days a week Moderate aortic atherosclerosis noted images pulled up and reviewed with her  Nonalcoholic steatohepatitis (NASH) Secondary to obesity,  Told she has cirrhosis by [facility]    Total encounter time more than 30 minutes  Greater than 50% was spent in counseling and coordination of care with the patient    No orders of the defined types were placed in this encounter.    Signed, Esmond Plants, M.D., Ph.D. 02/03/2022  [place], [place]

## 2022-02-03 ENCOUNTER — Ambulatory Visit: Payer: Medicare Other | Attending: Cardiovascular Disease | Admitting: Cardiovascular Disease

## 2022-02-03 ENCOUNTER — Other Ambulatory Visit: Payer: Self-pay

## 2022-02-03 ENCOUNTER — Encounter: Payer: Self-pay | Admitting: Cardiovascular Disease

## 2022-02-03 VITALS — BP 110/62 | HR 64 | Ht 62.5 in | Wt 152.0 lb

## 2022-02-03 DIAGNOSIS — Z8673 Personal history of transient ischemic attack (TIA), and cerebral infarction without residual deficits: Secondary | ICD-10-CM

## 2022-02-03 DIAGNOSIS — I739 Peripheral vascular disease, unspecified: Secondary | ICD-10-CM | POA: Diagnosis not present

## 2022-02-03 DIAGNOSIS — E782 Mixed hyperlipidemia: Secondary | ICD-10-CM

## 2022-02-03 DIAGNOSIS — I779 Disorder of arteries and arterioles, unspecified: Secondary | ICD-10-CM

## 2022-02-03 DIAGNOSIS — I471 Supraventricular tachycardia, unspecified: Secondary | ICD-10-CM

## 2022-02-03 DIAGNOSIS — E785 Hyperlipidemia, unspecified: Secondary | ICD-10-CM

## 2022-02-03 MED ORDER — ROSUVASTATIN CALCIUM 5 MG PO TABS: ORAL_TABLET | ORAL | 2 refills | Status: DC

## 2022-02-03 MED ORDER — EZETIMIBE 10 MG PO TABS: 10.0000 mg | ORAL_TABLET | Freq: Every day | ORAL | 2 refills | Status: DC

## 2022-02-03 NOTE — Patient Instructions (Addendum)
Medication Instructions:  Please restart zetia 10 mg daily Crestor 5 mg three days a week  If you need a refill on your cardiac medications before your next appointment, please call your pharmacy.   Lab work: No new labs needed  Testing/Procedures: No new testing needed  Follow-Up: At [facility], you and your health needs are our priority.  As part of our continuing mission to provide you with exceptional heart care, we have created designated Provider Care Teams.  These Care Teams include your primary Cardiologist (physician) and Advanced Practice Providers (APPs -  Physician Assistants and Nurse Practitioners) who all work together to provide you with the care you need, when you need it.  You will need a follow up appointment in 12 months  Providers on your designated Care Team:   Murray Hodgkins, NP Christell Faith, PA-C Cadence Kathlen Mody, [place]  COVID-19 Vaccine Information can be found at: [email or web address].uk For questions related to vaccine distribution or appointments, please email [email or web address] or call 2247072188.

## 2022-02-05 NOTE — Addendum Note (Signed)
Addended by: Michel Santee on: 02/05/2022 04:34 PM   Modules accepted: Orders

## 2022-02-24 ENCOUNTER — Telehealth: Payer: Self-pay | Admitting: Cardiovascular Disease

## 2022-02-24 MED ORDER — CARVEDILOL 3.125 MG PO TABS: 3.1250 mg | ORAL_TABLET | Freq: Two times a day (BID) | ORAL | 3 refills | Status: DC

## 2022-02-24 NOTE — Telephone Encounter (Signed)
Pt called to report bilateral lower extremity edema. Pt stated originally the swelling was in one leg, but for the past week it's both. Pt stated she's been wearing compression stocking as instructed by Dr. Rockey Situ, but swelling continues anytime she's on her feet. She report symptoms resolve some at night, but don't fully go down. Pt also report SOB with exertion x 2 weeks but denies significant weight increase.   Pt stated she's been taking lasix 20 mg almost daily for 3-4 weeks but doesn't notice a change in urination.   Will forward to MD for recommendations.

## 2022-02-24 NOTE — Telephone Encounter (Signed)
Pt c/o swelling: STAT is pt has developed SOB within 24 hours  How much weight have you gained and in what time span? 8 lbs in a week   If swelling, where is the swelling located? In feet and legs   Are you currently taking a fluid pill? Yes   Are you currently SOB? Yes for a few weeks   Do you have a log of your daily weights (if so, list)?   Have you gained 3 pounds in a day or 5 pounds in a week? 8 lbs in a week   Have you traveled recently? No    Pt believes she may need her fluid pill increased and would like to discuss this with someone.

## 2022-02-24 NOTE — Telephone Encounter (Signed)
*  STAT* If patient is at the pharmacy, call can be transferred to refill team.   1. Which medications need to be refilled? (please list name of each medication and dose if known) carvedilol (COREG) 3.125 MG tablet  2. Which pharmacy/location (including street and city if local pharmacy) is medication to be sent to? [place], [place] [address] AT [place]   3. Do they need a 30 day or 90 day supply? [place]

## 2022-03-03 NOTE — Telephone Encounter (Signed)
Patient has been made aware of Dr. Donivan Scull recommendations and verbalized her understanding. She will call back if this does not help.  Per Dr. Rockey Situ, Leg swelling can come from different causes  Need to make sure she is not sitting for prolonged period of time watching family  Needs to get up, walk around, definitely wear her compression hose  Would try to sit with legs elevated on the foot rest   Okay to increase Lasix up to 20 mg twice a day for 3 days, would make sure taking 1 potassium pill with every Lasix pill  Then back to Lasix 20 daily  Would moderate salt and fluid intake  Would let us know if symptoms persist  Thx  TGollan

## 2022-08-07 ENCOUNTER — Telehealth: Payer: Self-pay | Admitting: Cardiovascular Disease

## 2022-08-07 NOTE — Telephone Encounter (Signed)
Calling to let our office know she having a cardiac artery ultrasound done. Please advise

## 2022-08-07 NOTE — Telephone Encounter (Signed)
Spoke with patient and she reports that her PCP ordered carotid ultrasound to be done. They also referred her to vascular for swelling of lower extremities. Requested that she just ask that they send [place] a copy as well. She verbalized understanding with no further questions at this time.

## 2022-08-09 ENCOUNTER — Ambulatory Visit
Admission: EM | Admit: 2022-08-09 | Discharge: 2022-08-09 | Disposition: A | Discharge status: Home / Self Care | Payer: Medicare Other | Attending: Physician Assistant | Admitting: Physician Assistant

## 2022-08-09 ENCOUNTER — Encounter: Payer: Self-pay | Admitting: Emergency Medicine

## 2022-08-09 DIAGNOSIS — L03114 Cellulitis of left upper limb: Secondary | ICD-10-CM | POA: Diagnosis not present

## 2022-08-09 DIAGNOSIS — T23202A Burn of second degree of left hand, unspecified site, initial encounter: Secondary | ICD-10-CM | POA: Diagnosis not present

## 2022-08-09 MED ORDER — CEPHALEXIN 500 MG PO CAPS: 500.0000 mg | ORAL_CAPSULE | Freq: Four times a day (QID) | ORAL | 0 refills | Status: AC

## 2022-08-09 MED ORDER — MUPIROCIN 2 % EX OINT: 1.0000 | TOPICAL_OINTMENT | Freq: Two times a day (BID) | CUTANEOUS | 0 refills | Status: AC

## 2022-08-09 NOTE — ED Triage Notes (Signed)
Patient states that she burned the top of her left hand with a curling iron on Wed.  Patient states that the curling iron slipped out of her hand and landed on her left hand.  Patient has been putting neosporin on the area.  Patient states that today the area is redder and swollen and tender to the touch.
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
## 2022-08-09 NOTE — Discharge Instructions (Signed)
-  Your burn is infected. - Clean the wound every day with soap and water and apply the mupirocin ointment.  Change your bandage regularly as advised.  Try to keep the wound bed moist.  This will help it heal a little better. - I sent an antibiotic to the pharmacy.  Take as directed for the next 1 week. - If you feel that your symptoms have worsened-increased redness, swelling, worsening pain, fever-please return for reevaluation.

## 2022-08-18 ENCOUNTER — Other Ambulatory Visit: Payer: Self-pay | Admitting: Cardiovascular Disease

## 2022-08-19 ENCOUNTER — Telehealth: Payer: Self-pay | Admitting: Cardiovascular Disease

## 2022-08-19 NOTE — Telephone Encounter (Signed)
Patient states she has concerns with having a CT scan and she would like to speak directly with Dr. Windell Hummingbird nurse to discuss further.

## 2022-08-25 NOTE — Telephone Encounter (Signed)
  Called and spoke with patient. Informed her of the following from Dr. Mariah Milling.   It would appear that there is Less than 50% stenosis in the ICA both sides But vascular at [facility] concerned about the amount of plaque on the right and want to do a CT scan for further evaluation It would be up to her whether she would like further clarification with CT scan on the carotid plaque Thx TGollan   Patient verbalizes understanding.

## 2022-09-13 ENCOUNTER — Other Ambulatory Visit: Payer: Self-pay | Admitting: Cardiovascular Disease

## 2022-10-09 ENCOUNTER — Ambulatory Visit
Admission: EM | Admit: 2022-10-09 | Discharge: 2022-10-09 | Disposition: A | Discharge status: Home / Self Care | Payer: Medicare Other

## 2022-10-09 ENCOUNTER — Encounter: Payer: Self-pay | Admitting: Emergency Medicine

## 2022-10-09 DIAGNOSIS — R519 Headache, unspecified: Secondary | ICD-10-CM | POA: Diagnosis not present

## 2022-10-09 NOTE — ED Triage Notes (Signed)
Patient reports severe headache that started on the right side of her head that started yesterday.  Patient took OTC medicine for pain yesterday.  Patient states that today the pain is worse.  Patient denies N/V.   Patient reports that sharp pain that comes and goes.  Patient reports history of TIAs.

## 2022-10-09 NOTE — Discharge Instructions (Addendum)
Please go to the emergency department at [facility] for evaluation of your headache.
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
## 2022-10-09 NOTE — ED Notes (Signed)
Patient is being discharged from the Urgent Care and sent to the Emergency Department via POV . Per Becky Augusta, NP, patient is in need of higher level of care due to HA and h/o TIA. Patient is aware and verbalizes understanding of plan of care.  Vitals:   10/09/22 1836  BP: (!) 157/78  Pulse: 76  Resp: 14  Temp: 98.1 F (36.7 C)  SpO2: 96%

## 2023-02-13 ENCOUNTER — Other Ambulatory Visit: Payer: Self-pay | Admitting: Cardiovascular Disease

## 2023-02-15 NOTE — Telephone Encounter (Signed)
Please contact pt for future appointment. Pt due for 12 month f/u. 

## 2023-02-16 ENCOUNTER — Other Ambulatory Visit: Payer: Self-pay

## 2023-02-16 ENCOUNTER — Other Ambulatory Visit: Payer: Self-pay | Admitting: *Deleted

## 2023-02-16 MED ORDER — POTASSIUM CHLORIDE ER 10 MEQ PO TBCR: 10.0000 meq | EXTENDED_RELEASE_TABLET | ORAL | 0 refills | Status: DC | PRN

## 2023-02-16 MED ORDER — CARVEDILOL 3.125 MG PO TABS: 3.1250 mg | ORAL_TABLET | Freq: Two times a day (BID) | ORAL | 3 refills | Status: DC

## 2023-02-16 NOTE — Telephone Encounter (Signed)
Pt scheduled on 1/3

## 2023-02-16 NOTE — Telephone Encounter (Signed)
last office visit: 02/03/22  Next office visit:  03/05/23

## 2023-03-05 ENCOUNTER — Ambulatory Visit: Payer: Medicare Other | Attending: Cardiology | Admitting: Cardiology

## 2023-03-05 ENCOUNTER — Encounter: Payer: Self-pay | Admitting: Cardiology

## 2023-03-05 VITALS — BP 136/70 | HR 68 | Ht 62.0 in | Wt 155.0 lb

## 2023-03-05 DIAGNOSIS — I471 Supraventricular tachycardia, unspecified: Secondary | ICD-10-CM

## 2023-03-05 DIAGNOSIS — I6523 Occlusion and stenosis of bilateral carotid arteries: Secondary | ICD-10-CM

## 2023-03-05 DIAGNOSIS — R0602 Shortness of breath: Secondary | ICD-10-CM

## 2023-03-05 DIAGNOSIS — R42 Dizziness and giddiness: Secondary | ICD-10-CM | POA: Diagnosis not present

## 2023-03-05 DIAGNOSIS — I10 Essential (primary) hypertension: Secondary | ICD-10-CM

## 2023-03-05 DIAGNOSIS — E782 Mixed hyperlipidemia: Secondary | ICD-10-CM

## 2023-03-05 DIAGNOSIS — Z8673 Personal history of transient ischemic attack (TIA), and cerebral infarction without residual deficits: Secondary | ICD-10-CM

## 2023-03-05 NOTE — Patient Instructions (Signed)
 Medication Instructions:  - No changes *If you need a refill on your cardiac medications before your next appointment, please call your pharmacy*  Lab Work: - None ordered  Testing/Procedures: Your physician has requested that you have an echocardiogram. Echocardiography is a painless test that uses sound waves to create images of your heart. It provides your doctor with information about the size and shape of your heart and how well your heart's chambers and valves are working. This procedure takes approximately one hour. There are no restrictions for this procedure. Please do NOT wear cologne, perfume, aftershave, or lotions (deodorant is allowed). Please arrive 15 minutes prior to your appointment time.  Please note: We ask at that you not bring children with you during ultrasound (echo/ vascular) testing. Due to room size and safety concerns, children are not allowed in the ultrasound rooms during exams. Our front office staff cannot provide observation of children in our lobby area while testing is being conducted. An adult accompanying a patient to their appointment will only be allowed in the ultrasound room at the discretion of the ultrasound technician under special circumstances. We apologize for any inconvenience.   Your physician has requested that you have a carotid duplex. This test is an ultrasound of the carotid arteries in your neck. It looks at blood flow through these arteries that supply the brain with blood. Allow one hour for this exam. There are no restrictions or special instructions.   Follow-Up: At [facility], you and your health needs are our priority.  As part of our continuing mission to provide you with exceptional heart care, we have created designated Provider Care Teams.  These Care Teams include your primary Cardiologist (physician) and Advanced Practice Providers (APPs -  Physician Assistants and Nurse Practitioners) who all work together to provide you  with the care you need, when you need it.  Your next appointment:   1 year(s)  Provider:   Timothy Gollan, MD    Other Instructions - If testing comes back abnormal we will contact you for an earlier appointment

## 2023-03-05 NOTE — Progress Notes (Signed)
 Cardiology Office Note:  .   Date:  03/05/2023  ID:  Jade Gardner, DOB Oct 17, 1945, MRN [id number] PCP: Jade Levorn ORN, NP  [facility] Providers Cardiologist:  Evalene Lunger, MD    History of Present Illness: .   Jade Gardner is a 78 y.o. female with a past medical history of SVT, TIA (2015), headaches, hypertension, hyperlipidemia with an intolerance to simvastatin/rosuvastatin /atorvastatin , moderate aortic atherosclerosis on CT scan, peripheral arterial disease, carotid stenosis, who is here today for follow-up.   Echocardiogram completed in Jul 01, 2000 6 showed mild LVH, mild MR, moderate TR, biventricular systolic pressure 44 mmHg with mild pulmonary hypertension with.  Perfusion stress testing completed in March 03, 2004 showed normal ejection fraction, poor exercise tolerance on Bruce protocol for 3 minutes and 30 seconds.  Previous TIA 03/24/2013.  With acute onset of slurred speech, headache, dizziness, blurred vision, numbness.  She went to the emergency room and blood pressure was found to be 200/100.  She stated most of the day was discharged home.  She was on low-dose aspirin at the time of the TIA.  CT scan of the head showed no stroke, MRI of the head showed no acute stroke.  Lab work was essentially normal including a BMP and CBC.  Echocardiogram showed no ASD or PFO otherwise was a normal study.  Carotid ultrasound showed bilateral carotid artery disease of 40-59%.  Holter monitor revealed 1 short run of atrial tachycardia/SVT 7 beats and rare PACs.   She was last seen in clinic 02/10/2022 by Dr. Gollan.  At that time she was complaining of chest discomfort and upper back pain and feeling tired and exhausted but she had also been helping with family members.  There were no medication changes that were made and no further testing that was ordered.    She returns to clinic today stating that overall from a cardiac perspective she has been doing fairly well.  She had  noted an increased amount of dizziness as of late and some associated shortness of breath.  She noticed that the shortness of breath is primarily on stairs or inclines and not necessarily on flat surfaces.  She had previously been started on rosuvastatin  but only took it for short period of time as she said it gave her muscle fatigue and gave her myalgias.  She states that she has not had a recent echo or carotid duplex completed that her carotid stenosis have been followed by primary cardiology for some time.  She states that she has been compliant with the rest of her medication regimen.  She also denies any hospitalizations or visits to the emergency department.  Unfortunately she had had several visits to urgent care in [place] for headaches.  ROS: 10 point review of systems has been reviewed and considered negative except what is been listed in the HPI  Studies Reviewed: Jade Gardner   EKG Interpretation Date/Time:  Friday March 05 2023 11:14:56 EST Ventricular Rate:  68 PR Interval:  132 QRS Duration:  78 QT Interval:  404 QTC Calculation: 429 R Axis:   -5  Text Interpretation: Normal sinus rhythm Minimal voltage criteria for LVH, may be normal variant ( R in aVL ) When compared with ECG of 24-Mar-2013 12:27, No significant change was found Confirmed by Gerard Frederick ([id number]) on 03/05/2023 11:21:12 AM    Carotid Duplex 06/17/2016 Carotid ultrasound Less than 39% stenosis bilaterally Follow-up ultrasound only as needed Risk Assessment/Calculations:  Physical Exam:   VS:  BP 136/70   Pulse 68   Ht 5' 2 (1.575 m)   Wt 155 lb (70.3 kg)   SpO2 98%   BMI 28.35 kg/m    Wt Readings from Last 3 Encounters:  03/05/23 155 lb (70.3 kg)  10/09/22 151 lb 0.2 oz (68.5 kg)  08/09/22 151 lb (68.5 kg)    GEN: Well nourished, well developed in no acute distress NECK: No JVD; No carotid bruits CARDIAC: RRR, no murmurs, rubs, gallops RESPIRATORY:  Clear to auscultation without rales, wheezing  or rhonchi  ABDOMEN: Soft, non-tender, non-distended EXTREMITIES:  No edema; No deformity   ASSESSMENT AND PLAN: .   Dizziness and lightheadedness with headaches.  Patient with longstanding history of carotid stenosis with a last carotid duplex completed in 2018.  She has been scheduled for an updated carotid duplex at this time.  EKG today reveals sinus rhythm with a rate of 68, LVH, and a left axis deviation.  No acute changes from prior studies.  Occasional shortness of breath and dyspnea on exertion that she states she thinks has worsened over the past 6 to 8 months.  She had noticed that symptoms have been more progressive with stairs and inclines but likely component of age and deconditioning as well.  She has been scheduled for an updated echocardiogram.  Hypertension with blood pressure today 136/70.  Blood pressures remained stable.  She is continued on furosemide  20 mg as needed.  She has been encouraged to continue to monitor her pressures at home as well.  History of SVT that has been quiescent.  She is continued on low-dose carvedilol  3.125 mg twice daily.  She is asymptomatic and denies significant arrhythmias.  Mixed hyperlipidemia with an LDL of 98 with previous result of 148.  She had stopped taking her rosuvastatin  due to muscle aches and pain as well as her ezetimibe .  She has a history of transient cerebral ischemia.  She has had no recent neurological type events concerning for TIA or stroke.  She has had episodes of headaches that were likely related to blood pressure.  She is continued on clopidogrel  75 mg daily.       Dispo: Patient return to clinic to see MD/APP after testing has been completed or sooner if needed for reevaluation of symptoms.  Signed, Taden Witter, NP

## 2023-03-16 ENCOUNTER — Ambulatory Visit: Payer: Medicare Other | Attending: Cardiology

## 2023-03-16 ENCOUNTER — Other Ambulatory Visit: Payer: Self-pay | Admitting: Cardiovascular Disease

## 2023-03-16 DIAGNOSIS — I6523 Occlusion and stenosis of bilateral carotid arteries: Secondary | ICD-10-CM | POA: Diagnosis not present

## 2023-03-22 ENCOUNTER — Other Ambulatory Visit: Payer: Self-pay

## 2023-03-22 DIAGNOSIS — I6523 Occlusion and stenosis of bilateral carotid arteries: Secondary | ICD-10-CM

## 2023-03-22 NOTE — Progress Notes (Signed)
Right and left carotids with 1-39% stenosis noted in both.  Recommend repeat study in 6 months for reevaluation.

## 2023-03-29 ENCOUNTER — Ambulatory Visit: Payer: Medicare Other | Attending: Cardiology

## 2023-03-29 DIAGNOSIS — R0602 Shortness of breath: Secondary | ICD-10-CM

## 2023-03-29 LAB — ECHOCARDIOGRAM COMPLETE
AV Mean grad: 4 mm[Hg]
AV Peak grad: 8.6 mm[Hg]
Ao pk vel: 1.47 m/s
Area-P 1/2: 3.31 cm2
S' Lateral: 1.7 cm

## 2023-03-30 NOTE — Progress Notes (Signed)
Heart squeezes estimated 60 to 65% which is normal function, there were no regional wall motion abnormalities were noted, there is mild leakage noted in the mitral valve.  Overall reassuring study with no findings suggestive of the cause of symptoms.

## 2023-04-18 ENCOUNTER — Other Ambulatory Visit: Payer: Self-pay | Admitting: Cardiovascular Disease

## 2023-06-17 ENCOUNTER — Telehealth: Payer: Self-pay | Admitting: Cardiovascular Disease

## 2023-06-17 NOTE — Telephone Encounter (Signed)
   Patient Name: Jade Gardner  DOB: 10/22/1945 MRN: [id number]  Primary Cardiologist: Belva Boyden, MD  Chart reviewed as part of pre-operative protocol coverage.   Patient's Plavix is not managed by cardiology and guidance for holding should come from prescribing provider.   Francene Ing, Retha Cast, NP 06/17/2023, 5:01 PM

## 2023-06-17 NOTE — Telephone Encounter (Signed)
   Pre-operative Risk Assessment    Patient Name: Jade Gardner  DOB: 12-Aug-1945 MRN: [id number]   Date of last office visit: unknown Date of next office visit: unknown   Request for Surgical Clearance    Procedure:   egd  Date of Surgery:  Clearance TBD                                Surgeon:  not indicated Surgeon's Group or Practice Name:  [facility] Phone number:  [phone] Fax number:  [phone]   Type of Clearance Requested:   - Pharmacy:  Hold Clopidogrel (Plavix) instruction hold 5 days   Type of Anesthesia:  Not Indicated   Additional requests/questions:    Barnie Libra   06/17/2023, 4:47 PM

## 2023-07-19 ENCOUNTER — Other Ambulatory Visit: Payer: Self-pay

## 2023-07-19 MED ORDER — CARVEDILOL 3.125 MG PO TABS: 3.1250 mg | ORAL_TABLET | Freq: Two times a day (BID) | ORAL | 6 refills | Status: DC

## 2023-09-21 ENCOUNTER — Encounter (HOSPITAL_COMMUNITY)

## 2024-03-13 NOTE — Progress Notes (Signed)
 [facility] at [facility] Osteoporosis Follow Up Visit  Referring Provider: Ponce Arnulfo Kid Primary Provider: Myrtie Sensor, MD  Assessment & Plan Osteoporosis No history of fragility fractures, but is high fracture risk based on BMD, frailty, and comorbidities. She had suboptimal response to oral bisphosphonate despite prolonged exposure >10 years. Last DEXA in Nov 2025 showed osteoporosis at all sites, worst -3.3 in left femoral neck. Given CKD3-4 and history of vascular disease, we proceeded with Prolia. She had first therapy in Dec 2025. She has engaged in PT and now has significant improvement in functional mobility. Ongoing encouragement to continue PT exercises. We reiterated the importance of maintaining lifelong Prolia given risks of rapid BMD loss with delayed or missed administrations. There was prior concern for primary hyperparathyroidism with intermittently elevated calcium  and PTH levels, though she is not a primary surgical candidate and therefore are deferring any future workup for this at this time. - Continue Prolia therapy every 6 months. Next due in June 2026 at [facility]. - Recent calcium  monitoring was normal. Ok to continue modest calcium  supplementation 500 mg daily.  - Now graduated from PT. Encouraged ongoing exercises. - Advise regular dental  exams.   Follow up with Endocrinologist in about 5 months (around 08/18/2024).  No orders of the defined types were placed in this encounter.    Subjective  History of Present Illness: Jade Gardner is an 79 y.o. female who is being seen in follow up.  History of Present Illness Had Prolia injection at [facility] and tolerated it well. She is scheduled for her next Prolia injection on August 01, 2024.  No falls or new back pain since the last visit. She is taking calcium  500 mg and vitamin D3 supplements. Recent lab work shows stable calcium  levels. She has stage 3 kidney disease.  She  feels much improved after working with PT.  Fragility fractures: None known   Other fractures:  Finger fracture 1966 while palying football   DXA History (T-scores):     At [facility]: Site Region Measured Measured WHO Young Adult BMD  AP Spine L1-L4 01/19/2018 73.8 Osteopenia T -2.2 (0.922 g/cm2)  AP Spine L1-L4 08/29/2014 70.4 Osteopenia T -2.4 (0.901 g/cm2)  AP Spine L1-L4 08/29/2014 70.4 Osteopenia T -2.4 (0.902 g/cm2)  AP Spine L1-L4 09/02/2011 67.4 Osteopenia T -2.1 (0.934 g/cm2)   DualFemur Neck Right 01/19/2018 73.8 Osteoporosis T-2.6 (0.681 g/cm2)  DualFemur Neck Right 08/29/2014 70.4 Osteopenia T -2.4 (0.700 g/cm2)  DualFemur Neck Right 09/02/2011 67.4 Osteopenia T -2.0 (0.763 g/cm2)   DualFemur Total Mean 01/19/2018 73.8 Osteopenia T -2.3 (0.717 g/cm2)  DualFemur Total Mean 08/29/2014 70.4 Osteopenia T -1.9 (0.774 g/cm2)  DualFemur Total Mean 09/02/2011 67.4 Osteopenia T -1.8 (0.784 g/cm2)    Date Lumbar Spine Femoral Neck Total Hip  11/08/2019 ([facility]) -2.7 -3 (L) -1.8 (L)  08/21/2021 ([facility]) -3 -2.6 (L) -1.9 (L)    Has a DEXA coming up tomorrow    Treatment history: Fosamax from 2013-2016, Boniva  2016-2023. Was then planned for Prolia but never got due to cost issues.    Overall, >11 consecutive years of oral bisphosphonate therapy.    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, varices, ascites, on intermittent albumin infusions q2 weeks with periodic LVP (last in Sept 2025, only one in last 6 months) - on plavix  for  possible celiac stenosis and CAD/hx of TIAs - malnutrition and poor functional status - CKD 3b - Reflux on PPI   Menopause: late 40s, not on HRT   Kidney stones: none   Height loss: none   Falls: no falls, but is unsteady on feet   Vitamin D  intake: 2000  international units daily Calcium  intake: None, 1-2 servings of calcium -rich foods per day Exercise: Not much currently   Review of Systems: 12 Systems reviewed and otherwise negative except as noted in HPI.  I have reviewed past medical, surgical, medication, allergy, family and social histories today and updated them in Epic where appropriate.   Objective  Physical Exam: Vitals:   03/13/24 1027  BP: 132/73  Pulse: 66   Wt Readings from Last 6 Encounters:  03/13/24 60.6 kg (133 lb 9.6 oz)  03/06/24 56.4 kg (124 lb 6.4 oz)  02/21/24 54.9 kg (121 lb)  02/14/24 58.7 kg (129 lb 6.4 oz)  01/24/24 56.5 kg (124 lb 9.6 oz)  01/24/24 59.2 kg (130 lb 9.6 oz)   BMI Readings from Last 6 Encounters:  03/13/24 24.84 kg/m  03/06/24 24.30 kg/m  02/21/24 23.63 kg/m  02/14/24 25.27 kg/m  01/24/24 24.33 kg/m  01/24/24 24.82 kg/m    Ht Readings from Last 6 Encounters:  03/13/24 156.2 cm (5' 1.5)  02/21/24 152.4 cm (5')  01/24/24 152.4 cm (5')  01/11/24 154.5 cm (5' 0.83)  12/31/23 154.9 cm (5' 1)  12/10/23 154.9 cm (5' 1)   Physical Exam Constitutional:      General: She is not in acute distress. HENT:     Head: Normocephalic.     Mouth/Throat:     Mouth: Mucous membranes are moist.  Eyes:     General: No scleral icterus.    Extraocular Movements: Extraocular movements intact.     Conjunctiva/sclera: Conjunctivae normal.  Cardiovascular:     Rate and Rhythm: Normal rate and regular rhythm.     Heart sounds:     Gallop: Frail appearance.  Pulmonary:     Effort: Pulmonary effort is normal. No respiratory distress.     Breath sounds: Normal breath sounds.  Abdominal:     General: There is no distension.     Tenderness: There is no abdominal tenderness.  Musculoskeletal:     Right lower leg: No edema.     Left lower leg: No edema.     Comments: Decreased muscle bulk Able to stand from seated position without difficulty Gait is slow and measured Improvement  with unipedal standing Spine is straight without tenderness  Lymphadenopathy:     Cervical: No cervical adenopathy.  Skin:    General: Skin is warm.     Capillary Refill: Capillary refill takes less than 2 seconds.  Neurological:     General: No focal deficit present.     Mental Status: She is alert and oriented to person, place, and time.     Motor: No tremor.     Deep Tendon Reflexes: Reflexes normal.  Psychiatric:        Mood and Affect: Mood normal.        Behavior: Behavior normal.      Lab Review  Lab Results  Component Value Date   CALCIUM  9.6 03/06/2024   CALCIUM  9.5 02/08/2024   CALCIUM  10.3 01/24/2024   PHOS 3.0 10/29/2023   CREATININE 1.37 (H) 03/06/2024   CREATININE 1.26 (H) 02/08/2024   CREATININE 1.26 (H) 01/24/2024   VITDTOTAL 42.9 12/31/2023   VITDTOTAL 22.9  07/26/2018   PTH 115.6 (H) 01/11/2024   PTH 86.7 (H) 07/26/2018   ALBUMIN 3.3 (L) 03/06/2024   ALBUMIN 3.5 02/08/2024   ALBUMIN 3.2 (L) 01/24/2024    TSH (uIU/mL)  Date Value  12/31/2023 3.626  10/29/2023 1.989  03/01/2023 2.046  12/09/2020 1.730  11/22/2018 2.315   Free T4 (ng/dL)  Date Value  89/68/7974 1.08    Radiology: Reviewed. Pertinent findings include:   Results for orders placed during the hospital encounter of 01/12/24  Dexa Bone Density Skeletal  Narrative EXAM: DEXA BONE DENSITY SKELETAL  CLINICAL INDICATION: 79 years old Female with osteoporosis  - M81.0 - Osteoporosis, unspecified osteoporosis type, unspecified pathological fracture presence  COMPARISON: Most Recent: 08/21/2021. Baseline: 11/08/2019.  TECHNIQUE: Bone mineral density was assessed using the QDR 4500C bone densitometer.  The results of the study are expressed in bone mineral density (BMD) and interpreted using World Health Organization [facility]) criteria, per ISCD positions.  FINDINGS  Lumbar Spine Excluded Levels: none. BMD: 0.766 (g/cm2) T score: -2.6 Comparison (L1-L4): Baseline: 1.5% change since  baseline comparison study, which is not statistically significant. Recent: 6.8% change since most recent comparison study, which is statistically significant. Lumbar WHO classification: OSTEOPOROSIS.   Left Hip Femoral Neck: BMD: 0.481 (g/cm2) T score: -3.3  Total Hip BMD:     0.638 (g/cm2) T score: -2.5 Comparison: Baseline: -11.9% change since baseline comparison study, which is statistically significant. Recent: -10.4% change since most recent comparison study, which is statistically significant.  Hip WHO classification: OSTEOPOROSIS.  Notes: T-scores are used when the patient is older than 80 years of age or a female patient is documented as post-menopausal. Normal T-score is -1.0 or greater. Osteoporosis is diagnosed for any T-score -2.5 or lower. Negative percent change values reflect an interval decrease in the patient's bone mineral density since the noted comparison study. Interval increase in bone mineral density may be artifactual, if increased osseous sclerosis from arthrosis or other underlying pathology, such as AVN, is present and has progressed in the interval. If reported, the fracture risk estimate was calculated using FRAX version 3.08. Note, fracture probability is calculated for an untreated patient and fracture probability may be lower if the patient received or is currently receiving treatment. FRAX may not be reported if all of the T-scores are above -1.0, any T-score is below -2.5 or the related questionnaire was not completed at the time of imaging. Secondary causes of bone loss should be evaluated if clinically indicated since the etiology of low BMD cannot be determined by BMD measurement alone.  All treatment decisions require clinical judgement and consideration of individual patient factors, including patient preferences, comorbidities, previous drug use and risk factors not captured in the FRAX model (e.g. frailty, falls, vit. D deficiency, increased bone  turnover, interval significant decline in BMD, etc).  IMPRESSION  1.  WHO classification is OSTEOPOROSIS. 2.  There is statistically significant change since a comparison study, detailed above. --   Other Medical Data: Reviewed and summarized records in EPIC/Media and via CareEverywhere for purposes of today's visit. Any pertinent data is included in the note above.   This record has been created using Abridge AI-powered clinical conversation platform. Chart creation errors have been sought, but may not always have been located. Such creation errors do not reflect on the standard of medical care.

## 2024-03-13 NOTE — Telephone Encounter (Signed)
 Patient's daughter Mar) called & left a voicemail on Sunday (1/11) stating that the patient has gained about 8 lbs and she feels that she likely needs a paracentesis   She requested a call back to let her know [phone]  [facility]

## 2024-03-14 ENCOUNTER — Encounter: Payer: Self-pay | Admitting: Cardiology

## 2024-03-14 ENCOUNTER — Ambulatory Visit: Attending: Cardiology | Admitting: Cardiology

## 2024-03-14 VITALS — BP 128/62 | HR 63 | Ht 61.0 in | Wt 133.2 lb

## 2024-03-14 DIAGNOSIS — I6523 Occlusion and stenosis of bilateral carotid arteries: Secondary | ICD-10-CM

## 2024-03-14 DIAGNOSIS — R188 Other ascites: Secondary | ICD-10-CM | POA: Diagnosis not present

## 2024-03-14 DIAGNOSIS — E782 Mixed hyperlipidemia: Secondary | ICD-10-CM

## 2024-03-14 DIAGNOSIS — I471 Supraventricular tachycardia, unspecified: Secondary | ICD-10-CM | POA: Diagnosis not present

## 2024-03-14 DIAGNOSIS — I700 Atherosclerosis of aorta: Secondary | ICD-10-CM

## 2024-03-14 DIAGNOSIS — I10 Essential (primary) hypertension: Secondary | ICD-10-CM | POA: Diagnosis not present

## 2024-03-14 DIAGNOSIS — K7460 Unspecified cirrhosis of liver: Secondary | ICD-10-CM

## 2024-03-14 DIAGNOSIS — Z8673 Personal history of transient ischemic attack (TIA), and cerebral infarction without residual deficits: Secondary | ICD-10-CM | POA: Diagnosis not present

## 2024-03-14 MED ORDER — CARVEDILOL 3.125 MG PO TABS: 3.1250 mg | ORAL_TABLET | Freq: Two times a day (BID) | ORAL | 3 refills | Status: AC

## 2024-03-14 MED ORDER — FUROSEMIDE 20 MG PO TABS: 20.0000 mg | ORAL_TABLET | ORAL | 1 refills | Status: AC

## 2024-03-14 NOTE — Patient Instructions (Signed)
 Medication Instructions:  Your physician recommends that you continue on your current medications as directed. Please refer to the Current Medication list given to you today.   *If you need a refill on your cardiac medications before your next appointment, please call your pharmacy*  Lab Work: No labs ordered today  If you have labs (blood work) drawn today and your tests are completely normal, you will receive your results only by: MyChart Message (if you have MyChart) OR A paper copy in the mail If you have any lab test that is abnormal or we need to change your treatment, we will call you to review the results.  Testing/Procedures: Your physician has requested that you have a carotid duplex. This test is an ultrasound of the carotid arteries in your neck. It looks at blood flow through these arteries that supply the brain with blood.   Allow one hour for this exam.  There are no restrictions or special instructions.  This will take place at [address] [facility], [place] [address]  Please note: We ask at that you not bring children with you during ultrasound (echo/ vascular) testing. Due to room size and safety concerns, children are not allowed in the ultrasound rooms during exams. Our front office staff cannot provide observation of children in our lobby area while testing is being conducted. An adult accompanying a patient to their appointment will only be allowed in the ultrasound room at the discretion of the ultrasound technician under special circumstances. We apologize for any inconvenience.   Follow-Up: At [facility], you and your health needs are our priority.  As part of our continuing mission to provide you with exceptional heart care, our providers are all part of one team.  This team includes your primary Cardiologist (physician) and Advanced Practice Providers or APPs (Physician Assistants and Nurse Practitioners) who all work together to  provide you with the care you need, when you need it.  Your next appointment:   5 month(s)  Provider:   You may see Timothy Gollan, MD or one of the following Advanced Practice Providers on your designated Care Team:   Tylene Lunch, NP

## 2024-03-14 NOTE — Progress Notes (Signed)
 " Cardiology Office Note   Date:  03/14/2024  ID:  DESARE DUDDY, DOB Jul 12, 1945, MRN [id number] PCP: Geralene Levorn ORN, NP  [facility] Providers Cardiologist:  Evalene Lunger, MD     History of Present Illness Jade Gardner is a 79 y.o. female with a past medical history of SVT, TIA (2015), headaches, hypertension, hyperlipidemia with an intolerance to simvastatin/atorvastatin /atorvastatin , moderate aortic atherosclerosis on CT scan, peripheral arterial disease, carotid stenosis, is here today for follow-up.   Echocardiogram completed in Jul 01, 2000 6 showed mild LVH, mild MR, moderate TR, biventricular systolic pressure 44 mmHg with mild pulmonary hypertension with.  Perfusion stress testing completed in March 03, 2004 showed normal ejection fraction, poor exercise tolerance on Bruce protocol for 3 minutes and 30 seconds.  Previous TIA 03/24/2013.  With acute onset of slurred speech, headache, dizziness, blurred vision, numbness.  She went to the emergency room and blood pressure was found to be 200/100.  She stated most of the day was discharged home.  She was on low-dose aspirin at the time of the TIA.  CT scan of the head showed no stroke, MRI of the head showed no acute stroke.  Lab work was essentially normal including a BMP and CBC.  Echocardiogram showed no ASD or PFO otherwise was a normal study.  Carotid ultrasound showed bilateral carotid artery disease of 40-59%.  Holter monitor revealed 1 short run of atrial tachycardia/SVT 7 beats and rare PACs.  She was last seen in clinic 03/05/2023 stating overall she was doing well from a cardiac perspective.  She had noted an increased amount of dizziness as of late and some associated shortness of breath.  She states that she not had a recent echo or carotid duplex completed that her carotid stenosis has been followed by primary cardiology for some time.  Stated that she had been compliant with her current medication regimen and  denied any recent hospitalizations or visits to the emergency department.   She returns to clinic today accompanied by her daughter.  She is a doing fairly well from the cardiac perspective. Overall she has done okay without complaints of shortness of breath, chest pain, or palpitations.  Unfortunately she has had several hospitalizations at [facility] for generalized abdominal pain and cirrhosis that have required paracentesis.  They diagnosed her with a decompensated NASH cirrhosis with bleeding G2 EV, ascites.  They have considered it being multi factorial in the setting of worsening ascites and known celiac artery stenosis.  She did follow-up with vascular surgeon who advised (select artery stenosis was not to the extent as originally felt.  She received albumin and Lasix  infusions approximately every other week.  Her last infusion was on 03/06/2024.  She received 50 g of albumin and 20 mg of furosemide .  She also had labs completed in discussion of repeat paracentesis.  She has had multiple tabs that have had to have been done.  She has had several medication changes that were made since her multiple hospitalizations.  ROS: 10 point review of system has been reviewed and considered negative the exception was been listed in the HPI  Studies Reviewed EKG Interpretation Date/Time:  Tuesday March 14 2024 10:14:55 EST Ventricular Rate:  63 PR Interval:  120 QRS Duration:  72 QT Interval:  436 QTC Calculation: 446 R Axis:   6  Text Interpretation: Normal sinus rhythm When compared with ECG of 05-Mar-2023 11:14, No significant change since last tracing Confirmed by Gerard Frederick ([id number]) on 03/14/2024  10:19:57 AM    2d echo 03/29/2023  1. Left ventricular ejection fraction, by estimation, is 60 to 65%. The  left ventricle has normal function. The left ventricle has no regional  wall motion abnormalities. Left ventricular diastolic parameters were  normal. The average left ventricular  global longitudinal  strain is -20.0 %. The global longitudinal strain is  normal.   2. Right ventricular systolic function is normal. The right ventricular  size is normal.   3. Left atrial size was severely dilated.   4. The mitral valve is normal in structure. Mild mitral valve  regurgitation.   5. The aortic valve is tricuspid. Aortic valve regurgitation is mild.  Aortic valve sclerosis is present, with no evidence of aortic valve  stenosis.   6. The inferior vena cava is normal in size with greater than 50%  respiratory variability, suggesting right atrial pressure of 3 mmHg.   Carotid duplex 03/16/2023 Summary:  Right Carotid: Velocities in the right ICA are consistent with a 1-39%  stenosis.   Left Carotid: Velocities in the left ICA are consistent with a 1-39%  stenosis.    Follow-up exam recommended due to abundant plaque on the  right.   Vertebrals: Bilateral vertebral arteries demonstrate antegrade flow.  Subclavians: Normal flow hemodynamics were seen in bilateral subclavian               arteries.   Carotid Duplex 06/17/2016 Carotid ultrasound Less than 39% stenosis bilaterally Follow-up ultrasound only as needed  Risk Assessment/Calculations           Physical Exam VS:  BP 128/62 (BP Location: Left Arm, Patient Position: Sitting, Cuff Size: Normal)   Pulse 63   Ht 5' 1 (1.549 m)   Wt 133 lb 3.2 oz (60.4 kg)   SpO2 98%   BMI 25.17 kg/m        Wt Readings from Last 3 Encounters:  03/14/24 133 lb 3.2 oz (60.4 kg)  03/05/23 155 lb (70.3 kg)  10/09/22 151 lb 0.2 oz (68.5 kg)    GEN: Well nourished, well developed in no acute distress NECK: No JVD; No carotid bruits CARDIAC: RRR, no murmurs, rubs, gallops RESPIRATORY:  Clear to auscultation without rales, wheezing or rhonchi  ABDOMEN: Soft, non-tender, distended EXTREMITIES: 1+ edema to the bilateral lower extremities with the right being slightly larger than the left; No deformity   ASSESSMENT AND PLAN Hypertension with  a blood pressure today 128/62.  Blood pressures remain stable.  She is continued on furosemide  20 mg as needed as well as carvedilol  3.125 mg twice daily.  Recommend checking pressures at home 1 to 2 hours postmedication as well.  Mixed hyperlipidemia with a previous LDL of 98 she had to stop taking rosuvastatin  and ezetimibe  due to elevated liver functions and cirrhosis.  History of SVT that has been quiescent.  She is continued on low-dose carvedilol  3.25 mg twice daily.  Carotid artery disease with a history of lightheadedness and dizziness previously carotid duplex last year revealed 1-39% carotid artery stenosis.  She has been scheduled for an updated carotid duplex.  History of transient cerebral ischemia with no recent neurological type events concerning for TIA or stroke.  She is continued on clopidogrel  75 mg daily.  Moderate aortic atherosclerosis on CT scan that had previously been reviewed.  Unfortunately she is not able to take statin.  She is continued on clopidogrel  75 mg daily.  Denies any current chest discomfort.  EKG today reveals sinus rhythm with  a rate of 63 with no acute ischemic changes.  NASH cirrhosis and ascites with recurrent paracentesis completed at [facility].  Ongoing management per liver specialist at [facility].       Dispo: Patient return to see MD/APP in 6 months or sooner if needed.  Follow-up appointment recommended with primary cardiologist of Dr. Gollan.  Signed, Shaylin Blatt, NP   "

## 2024-03-31 ENCOUNTER — Ambulatory Visit: Attending: Cardiology

## 2024-03-31 DIAGNOSIS — I6523 Occlusion and stenosis of bilateral carotid arteries: Secondary | ICD-10-CM

## 2024-04-03 ENCOUNTER — Ambulatory Visit: Payer: Self-pay | Admitting: Cardiology

## 2024-08-14 ENCOUNTER — Ambulatory Visit: Admitting: Cardiovascular Disease
